# Patient Record
Sex: Female | Born: 1955 | Race: White | Hispanic: Yes | Marital: Married | Smoking: Former smoker
Health system: Southern US, Community
[De-identification: ages and names within clinical notes are randomized; demographics above are authoritative.]

## PROBLEM LIST (undated history)

## (undated) DIAGNOSIS — I5189 Other ill-defined heart diseases: Secondary | ICD-10-CM

## (undated) DIAGNOSIS — G8929 Other chronic pain: Secondary | ICD-10-CM

## (undated) DIAGNOSIS — I7300 Raynaud's syndrome without gangrene: Secondary | ICD-10-CM

## (undated) DIAGNOSIS — I779 Disorder of arteries and arterioles, unspecified: Secondary | ICD-10-CM

## (undated) DIAGNOSIS — M1990 Unspecified osteoarthritis, unspecified site: Secondary | ICD-10-CM

## (undated) DIAGNOSIS — M47812 Spondylosis without myelopathy or radiculopathy, cervical region: Secondary | ICD-10-CM

## (undated) DIAGNOSIS — Z9889 Other specified postprocedural states: Secondary | ICD-10-CM

## (undated) DIAGNOSIS — I2510 Atherosclerotic heart disease of native coronary artery without angina pectoris: Secondary | ICD-10-CM

## (undated) DIAGNOSIS — R51 Headache: Secondary | ICD-10-CM

## (undated) DIAGNOSIS — F329 Major depressive disorder, single episode, unspecified: Secondary | ICD-10-CM

## (undated) DIAGNOSIS — F419 Anxiety disorder, unspecified: Secondary | ICD-10-CM

## (undated) DIAGNOSIS — R002 Palpitations: Secondary | ICD-10-CM

## (undated) DIAGNOSIS — R112 Nausea with vomiting, unspecified: Secondary | ICD-10-CM

## (undated) DIAGNOSIS — F32A Depression, unspecified: Secondary | ICD-10-CM

## (undated) DIAGNOSIS — E063 Autoimmune thyroiditis: Secondary | ICD-10-CM

## (undated) DIAGNOSIS — R29898 Other symptoms and signs involving the musculoskeletal system: Secondary | ICD-10-CM

## (undated) DIAGNOSIS — T8859XA Other complications of anesthesia, initial encounter: Secondary | ICD-10-CM

## (undated) DIAGNOSIS — T4145XA Adverse effect of unspecified anesthetic, initial encounter: Secondary | ICD-10-CM

## (undated) HISTORY — PX: KNEE SURGERY: SHX244

## (undated) HISTORY — PX: HIP SURGERY: SHX245

## (undated) HISTORY — DX: Palpitations: R00.2

## (undated) HISTORY — DX: Other ill-defined heart diseases: I51.89

## (undated) HISTORY — PX: BACK SURGERY: SHX140

## (undated) HISTORY — PX: TONSILLECTOMY: SUR1361

## (undated) HISTORY — DX: Disorder of arteries and arterioles, unspecified: I77.9

## (undated) HISTORY — PX: SPINE SURGERY: SHX786

## (undated) HISTORY — DX: Spondylosis without myelopathy or radiculopathy, cervical region: M47.812

## (undated) HISTORY — DX: Other symptoms and signs involving the musculoskeletal system: R29.898

## (undated) HISTORY — DX: Raynaud's syndrome without gangrene: I73.00

## (undated) HISTORY — DX: Atherosclerotic heart disease of native coronary artery without angina pectoris: I25.10

---

## 1999-04-07 ENCOUNTER — Emergency Department (HOSPITAL_COMMUNITY): Admission: EM | Admit: 1999-04-07 | Discharge: 1999-04-07 | Payer: Self-pay | Admitting: Emergency Medicine

## 1999-04-22 ENCOUNTER — Ambulatory Visit (HOSPITAL_COMMUNITY): Admission: RE | Admit: 1999-04-22 | Discharge: 1999-04-22 | Payer: Self-pay | Admitting: Gastroenterology

## 1999-04-22 ENCOUNTER — Encounter: Payer: Self-pay | Admitting: Gastroenterology

## 1999-05-10 ENCOUNTER — Ambulatory Visit (HOSPITAL_COMMUNITY): Admission: RE | Admit: 1999-05-10 | Discharge: 1999-05-10 | Payer: Self-pay | Admitting: Gastroenterology

## 1999-05-10 ENCOUNTER — Encounter: Payer: Self-pay | Admitting: Gastroenterology

## 1999-09-11 ENCOUNTER — Encounter: Payer: Self-pay | Admitting: Emergency Medicine

## 1999-09-11 ENCOUNTER — Emergency Department (HOSPITAL_COMMUNITY): Admission: EM | Admit: 1999-09-11 | Discharge: 1999-09-11 | Payer: Self-pay | Admitting: Emergency Medicine

## 2002-04-03 ENCOUNTER — Encounter: Payer: Self-pay | Admitting: Emergency Medicine

## 2002-04-03 ENCOUNTER — Emergency Department (HOSPITAL_COMMUNITY): Admission: EM | Admit: 2002-04-03 | Discharge: 2002-04-03 | Payer: Self-pay | Admitting: Emergency Medicine

## 2002-04-03 ENCOUNTER — Encounter: Payer: Self-pay | Admitting: Family Medicine

## 2002-04-04 ENCOUNTER — Encounter: Payer: Self-pay | Admitting: Gastroenterology

## 2002-07-01 ENCOUNTER — Ambulatory Visit (HOSPITAL_COMMUNITY): Admission: RE | Admit: 2002-07-01 | Discharge: 2002-07-01 | Payer: Self-pay | Admitting: Family Medicine

## 2004-04-08 ENCOUNTER — Encounter: Admission: RE | Admit: 2004-04-08 | Discharge: 2004-04-08 | Payer: Self-pay | Admitting: Family Medicine

## 2004-04-19 ENCOUNTER — Encounter: Admission: RE | Admit: 2004-04-19 | Discharge: 2004-04-19 | Payer: Self-pay | Admitting: Family Medicine

## 2004-08-01 ENCOUNTER — Emergency Department (HOSPITAL_COMMUNITY): Admission: EM | Admit: 2004-08-01 | Discharge: 2004-08-02 | Payer: Self-pay | Admitting: Emergency Medicine

## 2004-08-02 ENCOUNTER — Ambulatory Visit: Payer: Self-pay | Admitting: Psychiatry

## 2004-08-02 ENCOUNTER — Inpatient Hospital Stay (HOSPITAL_COMMUNITY): Admission: EM | Admit: 2004-08-02 | Discharge: 2004-08-04 | Payer: Self-pay | Admitting: Psychiatry

## 2004-08-04 ENCOUNTER — Emergency Department (HOSPITAL_COMMUNITY): Admission: EM | Admit: 2004-08-04 | Discharge: 2004-08-04 | Payer: Self-pay | Admitting: Family Medicine

## 2005-06-30 ENCOUNTER — Encounter: Admission: RE | Admit: 2005-06-30 | Discharge: 2005-09-28 | Payer: Self-pay | Admitting: Neurosurgery

## 2005-07-21 ENCOUNTER — Ambulatory Visit (HOSPITAL_COMMUNITY): Admission: RE | Admit: 2005-07-21 | Discharge: 2005-07-21 | Payer: Self-pay | Admitting: Neurosurgery

## 2010-01-06 ENCOUNTER — Encounter: Admission: RE | Admit: 2010-01-06 | Discharge: 2010-01-06 | Payer: Self-pay | Admitting: Neurosurgery

## 2010-01-17 ENCOUNTER — Encounter: Admission: RE | Admit: 2010-01-17 | Discharge: 2010-01-17 | Payer: Self-pay | Admitting: Neurosurgery

## 2010-03-02 ENCOUNTER — Encounter: Admission: RE | Admit: 2010-03-02 | Discharge: 2010-03-02 | Payer: Self-pay | Admitting: Neurosurgery

## 2010-03-15 ENCOUNTER — Encounter: Admission: RE | Admit: 2010-03-15 | Discharge: 2010-03-15 | Payer: Self-pay | Admitting: Neurosurgery

## 2010-06-20 ENCOUNTER — Encounter
Admission: RE | Admit: 2010-06-20 | Discharge: 2010-06-20 | Payer: Self-pay | Source: Home / Self Care | Attending: Neurosurgery | Admitting: Neurosurgery

## 2010-06-21 ENCOUNTER — Emergency Department (HOSPITAL_COMMUNITY)
Admission: EM | Admit: 2010-06-21 | Discharge: 2010-06-21 | Payer: Self-pay | Source: Home / Self Care | Admitting: Emergency Medicine

## 2010-06-24 ENCOUNTER — Emergency Department (HOSPITAL_COMMUNITY)
Admission: EM | Admit: 2010-06-24 | Discharge: 2010-06-24 | Payer: Self-pay | Source: Home / Self Care | Admitting: Emergency Medicine

## 2010-06-27 LAB — URINALYSIS, ROUTINE W REFLEX MICROSCOPIC
Bilirubin Urine: NEGATIVE
Hgb urine dipstick: NEGATIVE
Ketones, ur: 15 mg/dL — AB
Nitrite: NEGATIVE
Protein, ur: NEGATIVE mg/dL
Specific Gravity, Urine: 1.01 (ref 1.005–1.030)
Urine Glucose, Fasting: NEGATIVE mg/dL
Urobilinogen, UA: 1 mg/dL (ref 0.0–1.0)
pH: 5.5 (ref 5.0–8.0)

## 2010-06-27 LAB — DIFFERENTIAL
Basophils Absolute: 0 10*3/uL (ref 0.0–0.1)
Basophils Relative: 0 % (ref 0–1)
Eosinophils Absolute: 0.1 10*3/uL (ref 0.0–0.7)
Eosinophils Relative: 1 % (ref 0–5)
Lymphocytes Relative: 11 % — ABNORMAL LOW (ref 12–46)
Lymphs Abs: 1 10*3/uL (ref 0.7–4.0)
Monocytes Absolute: 0.8 10*3/uL (ref 0.1–1.0)
Monocytes Relative: 10 % (ref 3–12)
Neutro Abs: 6.5 10*3/uL (ref 1.7–7.7)
Neutrophils Relative %: 78 % — ABNORMAL HIGH (ref 43–77)

## 2010-06-27 LAB — CBC
HCT: 50.2 % — ABNORMAL HIGH (ref 36.0–46.0)
Hemoglobin: 17.2 g/dL — ABNORMAL HIGH (ref 12.0–15.0)
MCH: 32.1 pg (ref 26.0–34.0)
MCHC: 34.3 g/dL (ref 30.0–36.0)
MCV: 93.7 fL (ref 78.0–100.0)
Platelets: 225 10*3/uL (ref 150–400)
RBC: 5.36 MIL/uL — ABNORMAL HIGH (ref 3.87–5.11)
RDW: 12.3 % (ref 11.5–15.5)
WBC: 8.4 10*3/uL (ref 4.0–10.5)

## 2010-06-27 LAB — COMPREHENSIVE METABOLIC PANEL
ALT: 11 U/L (ref 0–35)
AST: 18 U/L (ref 0–37)
Albumin: 4.3 g/dL (ref 3.5–5.2)
Alkaline Phosphatase: 57 U/L (ref 39–117)
BUN: 12 mg/dL (ref 6–23)
CO2: 27 mEq/L (ref 19–32)
Calcium: 9.4 mg/dL (ref 8.4–10.5)
Chloride: 101 mEq/L (ref 96–112)
Creatinine, Ser: 0.65 mg/dL (ref 0.4–1.2)
GFR calc Af Amer: >60 mL/min (ref >60)
GFR calc non Af Amer: >60 mL/min (ref >60)
Glucose, Bld: 122 mg/dL — ABNORMAL HIGH (ref 70–99)
Potassium: 3.2 mEq/L — ABNORMAL LOW (ref 3.5–5.1)
Sodium: 137 mEq/L (ref 135–145)
Total Bilirubin: 0.8 mg/dL (ref 0.3–1.2)
Total Protein: 6.4 g/dL (ref 6.0–8.3)

## 2010-06-27 LAB — LIPASE, BLOOD: Lipase: 18 U/L (ref 11–59)

## 2010-07-03 ENCOUNTER — Encounter: Payer: Self-pay | Admitting: Neurosurgery

## 2010-07-13 ENCOUNTER — Encounter: Payer: Self-pay | Admitting: Geriatric Medicine

## 2010-08-08 ENCOUNTER — Encounter (HOSPITAL_COMMUNITY): Payer: 59

## 2010-08-08 DIAGNOSIS — Z01811 Encounter for preprocedural respiratory examination: Secondary | ICD-10-CM | POA: Insufficient documentation

## 2010-08-08 DIAGNOSIS — Z01812 Encounter for preprocedural laboratory examination: Secondary | ICD-10-CM | POA: Insufficient documentation

## 2010-08-08 LAB — CBC
Hemoglobin: 15.2 g/dL — ABNORMAL HIGH (ref 12.0–15.0)
MCV: 96.9 fL (ref 78.0–100.0)
Platelets: 207 10*3/uL (ref 150–400)
RBC: 4.87 MIL/uL (ref 3.87–5.11)
WBC: 7.4 10*3/uL (ref 4.0–10.5)

## 2010-08-08 LAB — URINALYSIS, ROUTINE W REFLEX MICROSCOPIC
Hgb urine dipstick: NEGATIVE
Nitrite: NEGATIVE
Specific Gravity, Urine: 1.03 (ref 1.005–1.030)
Urobilinogen, UA: 0.2 mg/dL (ref 0.0–1.0)
pH: 5.5 (ref 5.0–8.0)

## 2010-08-08 LAB — COMPREHENSIVE METABOLIC PANEL
AST: 23 U/L (ref 0–37)
Albumin: 4.3 g/dL (ref 3.5–5.2)
Alkaline Phosphatase: 55 U/L (ref 39–117)
CO2: 30 mEq/L (ref 19–32)
Chloride: 104 mEq/L (ref 96–112)
GFR calc Af Amer: >60 mL/min (ref >60)
GFR calc non Af Amer: >60 mL/min (ref >60)
Potassium: 4.7 mEq/L (ref 3.5–5.1)
Total Bilirubin: 0.5 mg/dL (ref 0.3–1.2)

## 2010-08-12 ENCOUNTER — Ambulatory Visit (HOSPITAL_COMMUNITY): Payer: 59

## 2010-08-12 ENCOUNTER — Ambulatory Visit (HOSPITAL_COMMUNITY)
Admission: RE | Admit: 2010-08-12 | Discharge: 2010-08-12 | Disposition: A | Discharge status: Home / Self Care | Payer: 59 | Source: Ambulatory Visit | Attending: Orthopedic Surgery | Admitting: Orthopedic Surgery

## 2010-08-12 DIAGNOSIS — M24159 Other articular cartilage disorders, unspecified hip: Secondary | ICD-10-CM | POA: Insufficient documentation

## 2010-08-12 DIAGNOSIS — F17200 Nicotine dependence, unspecified, uncomplicated: Secondary | ICD-10-CM | POA: Insufficient documentation

## 2010-08-12 DIAGNOSIS — Z01818 Encounter for other preprocedural examination: Secondary | ICD-10-CM | POA: Insufficient documentation

## 2010-08-12 DIAGNOSIS — Z01812 Encounter for preprocedural laboratory examination: Secondary | ICD-10-CM | POA: Insufficient documentation

## 2010-08-18 NOTE — Op Note (Signed)
NAMEKALLIOPE, RIESEN              ACCOUNT NO.:  [id number]  MEDICAL RECORD NO.:  [id number]           PATIENT TYPE:  O  LOCATION:  DAYL                         FACILITY:  [facility]  PHYSICIAN:  Ollen Gross, M.D.    DATE OF BIRTH:  1955/10/19  DATE OF PROCEDURE:  08/12/2010 DATE OF DISCHARGE:                              OPERATIVE REPORT   PREOPERATIVE DIAGNOSIS:  Right hip labral tear.  POSTOPERATIVE DIAGNOSIS:  Right hip labral tear, plus acetabular chondral defect.  PROCEDURE:  Right hip arthroscopy and labral debridement and chondroplasty.  SURGEON:  Ollen Gross, M.D.  ASSISTANT:  Alexzandrew L. Perkins, P.A.C.  ANESTHESIA:  General.  ESTIMATED BLOOD LOSS:  Minimal.  DRAINS:  None.  COMPLICATIONS:  None.  CONDITION.:  Stable to recovery.  BRIEF CLINICAL NOTE:  Eri is a 55 year old female who had a long history of several months of significant right hip pain and limited mobility.  Her x-rays are normal.  Exam and history suggests a labral tear confirmed by MRI.  She has failed nonoperative management and presents for hip arthroscopy and debridement.  PROCEDURE IN DETAIL:  After successful administration of general anesthetic, the patient was placed in the left lateral decubitus position with the right side up.  The right thigh was placed over the well-padded perineal post and right foot placed in well-padded leg holder.  Under fluoroscopic guidance, traction was applied to the joint until the hip was adequately distracted.  Under fluoro guidance, once confirmed to be adequately distracted, it was locked in this position. Thigh was then prepped and draped in usual sterile fashion.  Spinal needles were passed into the joint and the sites marked with anterior and posterior peritrochanteric portals.  Both were felt to enter the joint and confirmed to be intra-articular by injecting saline through the posterior needle and have it exit through the anterior needle.   The portals were then created over nitinol wires.  The camera was passed through the cannula into the joint posteriorly.  Arthroscopic visualization proceeds.  Femoral head looked fairly normal throughout its entire extent.  I did not see any chondral defects or significant chondromalacia.  Posterior aspect of the joint looked normal, superior looked normal.  Anteriorly, there was a massive labral tear from the 4 o'clock all the way to the 1 o'clock position.  We then created the anterior portal.  The labrum was not detached from the acetabular wall.  It was still attached.  The tear was in the substance and the edge of the labrum.  This was debrided back to a stable base with the 4.2-mm shaver and with the ArthroCare device. The ArthroCare helped seal with tissue loss.  There was also an acetabular chondral defect about 1 cm x 2 cm at about 3 o'clock to 4 o'clock position.  There was exposed bone in this area.  The bone was abraded.  The edges of this were debrided down to stable cartilaginous edges and probed and there was no other loose cartilage on the acetabulum.  Joints again inspected.  No other tears, defects or loose bodies noted.  A 20 cc of 0.25% Marcaine  with epi were then injected into the joint through the inflow cannula.  Then equipment was removed from the joint and traction released.  The hip was concentrically reduced.  The incisions were then closed with interrupted 4-0 nylon. Bulky sterile dressings were then applied and she was awakened and transported to recovery in stable condition.     Ollen Gross, M.D.     FA/MEDQ  D:  08/12/2010  T:  08/12/2010  Job:  [id number]  Electronically Signed by Ollen Gross M.D. on 08/17/2010 03:46:57 PM
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
## 2010-10-28 NOTE — Op Note (Signed)
NAME:  Michaela Hall, Michaela Hall                        ACCOUNT NO.:  [id number]   MEDICAL RECORD NO.:  [id number]                   PATIENT TYPE:  EMS   LOCATION:  MAJO                                 FACILITY:  [facility]   PHYSICIAN:  Bernette Redbird, MD                  DATE OF BIRTH:  July 22, 1955   DATE OF PROCEDURE:  04/03/2002  DATE OF DISCHARGE:                                 OPERATIVE REPORT   PROCEDURE PERFORMED:  Upper endoscopy.   ENDOSCOPIST:  Florencia Reasons, M.D.   INDICATIONS FOR PROCEDURE:  The patient is a 55 year old female who  presented to the emergency room tonight because of the abrupt onset of  severe 10/10 pain in her chest and upper abdomen which occurred while she  was out in the back yard, without any recent food intake.  This was  associated with frank syncope, subsequently by hematemesis, then further  presyncope and continued hematemesis.   On arrival at the emergency room, findings were fairly unimpressive.  Vital  signs were okay, the patient looked fairly good.  Hemoglobin was 15, BUN was  14, stool was hemoccult negative.  Abdominal exam was benign, but on the  last endoscopic evaluation was felt to be appropriate.   FINDINGS:  The purpose of this study is to rule out ulcer disease, Mallory-  Weiss tear, or varices as the source of the upper GI bleeding.  Essentially  normal exam.  Minor insignificant abnormalities as described below.   DESCRIPTION OF PROCEDURE:  The nature, purpose and risks of the procedure  had been reviewed with the patient who provided written consent.  The  procedure was done at the beside in the emergency room.  Sedation was  fentanyl 50 mcg and Versed 7 mg IV without arrhythmias or desaturation.  The  Olympus video endoscope was passed under direct vision.  Vocal cords were  unremarkable.  The esophagus was readily entered.   Just above the squamocolumnar junction, the esophageal was slightly  chronically inflamed and irregular,  but this is really a very minimal  finding.  No erosive changes, Barrett's esophagus, varices, infection or  neoplasia were observed.  There was a widely patent esophageal mucosal ring  and below this was a 2 cm  hiatal hernia.  I saw no evidence of a Mallory-  Weiss tear there or any stigma or hemorrhage.   The stomach was entered.  It contained absolutely  no blood or coffee ground  material apart from some adherent tiny strands of mucosal hemorrhage blood  scattered here and there in the stomach.  No gastritis, erosions, ulcers,  polyps, masses or vascular ectasia were observed including a retroflex view  of the proximal stomach.  The pylorus, duodenal bulb and entire duodenum  looked normal (the scope was able to be inserted and advanced quite far down  the small bowel).   Pullback was then performed and I  re-examined the stomach and duodenum  again, yet still I could not find any specific source of  hematemesis.  The  scope was then removed from the patient, who tolerated the procedure well  and there were no apparent complications.  No biopsies were obtained.   IMPRESSION:  Essentially normal upper endoscopy (note that a small amount of  bilious fluid was present in the stomach at the time of the procedure).   PLAN:  I would favor a CT scan to help rule out anatomic abnormalities such  as the pancreatic artery aneurysm that could lead to hematemesis.  If that  is negative, then the patient could be worked up in standard fashion for her  syncope to the degree it is felt to be appropriate by ER staff physician.                                                Bernette Redbird, MD    RB/MEDQ  D:  04/04/2002  T:  04/04/2002  Job:  [id number]   cc:   [name], M.D.

## 2010-10-28 NOTE — Consult Note (Signed)
NAME:  Michaela Hall, Michaela Hall                        ACCOUNT NO.:  [id number]   MEDICAL RECORD NO.:  [id number]                   PATIENT TYPE:  EMS   LOCATION:  MAJO                                 FACILITY:  [facility]   PHYSICIAN:  Bernette Redbird, MD                  DATE OF BIRTH:  12/20/1955   DATE OF CONSULTATION:  04/04/2002  DATE OF DISCHARGE:                                   CONSULTATION   Dr. Nelva Nay of the [facility] ER staff asked me to see this 55-year-  old female because of hematemesis and syncope.   HISTORY OF PRESENT ILLNESS:  The patient is known to me from many years ago  when I saw her in the office and did endoscopy on her as part of an calculus  gallbladder disease workup.   Generally, she is free of any GI tract symptoms, and she has no ulcer risk  factors apart from smoking one-half pack per day, specifically, no  significant recent use of aspirin or NSAIDs.   With that background, she was standing in the backyard this evening when she  had the onset of severe chest pain like a heart attack.  She felt like she  was about to pass out and in fact did fall to the ground after trying to sit  down on the trash can.  She walked into the house after coming to and was  going to take a shower, but again got presyncopal and had to let herself  down.  While inside, she had hematemesis initially of dark blood and then of  brighter red blood and threw up blood all the way to the hospital.   Interestingly, in the emergency room, the patient has been hemodynamically  quite stable and has been feeling much better since receiving Zofran.   ALLERGIES:  CODEINE (makes her feel sick).   CURRENT MEDICATIONS:  Prozac 20 mg daily.   PAST SURGICAL HISTORY:  1. Laminectomy x 2.  2. Knee surgery.   PAST MEDICAL HISTORY:  Depression.  No history of ulcer disease or GI  bleeding.  No know cardiopulmonary disease, diabetes, or hypertension.   HABITS:  Smokes one-half pack per  day, occasional ethanol.   FAMILY HISTORY:  Negative for colon cancer or gallbladder trouble.   SOCIAL HISTORY:  Lives with her husband, Raiford Noble, who is also a patient of  mine.   REVIEW OF SYSTEMS:  Negative for prodromal upper tract or lower tract GI  symptoms.   PHYSICAL EXAMINATION:  GENERAL: The patient at this time is in no evident  distress whatsoever.  VITAL SIGNS:  Blood pressure 94/63, pulse 81.  HEENT:  She is anicteric and without frank pallor.  CHEST:  Clear.  HEART:  Normal.  ABDOMEN:  No bruits, no organomegaly, no guarding, mass, tenderness, or  pulsatile mass.  No impressive tenderness whatsoever; perhaps just a little  bit of  touchiness in the upper abdomen.  RECTAL:  Completely empty rectal ampulla with mucoid heme-negative stool  grossly by my testing.   LABORATORY DATA:  Hemoglobin normal at 15.  BUN 14.  White count normal.   Three-way abdomen normal.   EKG normal.   Cardiac enzymes normal.   IMPRESSION:  Abrupt onset of upper abdominal symptoms of unclear cause,  associated with hematemesis but not so far significant drop in hemoglobin.   PLAN:  Endoscopic evaluation with further management to depend on the  findings of that exam.                                                Bernette Redbird, MD    RB/MEDQ  D:  04/04/2002  T:  04/04/2002  Job:  [id number]

## 2011-03-10 ENCOUNTER — Emergency Department (HOSPITAL_COMMUNITY): Payer: 59

## 2011-03-10 ENCOUNTER — Emergency Department (HOSPITAL_COMMUNITY)
Admission: EM | Admit: 2011-03-10 | Discharge: 2011-03-10 | Disposition: A | Discharge status: Home / Self Care | Payer: 59 | Attending: Emergency Medicine | Admitting: Emergency Medicine

## 2011-03-10 DIAGNOSIS — M545 Low back pain, unspecified: Secondary | ICD-10-CM | POA: Insufficient documentation

## 2011-03-10 DIAGNOSIS — M79609 Pain in unspecified limb: Secondary | ICD-10-CM | POA: Insufficient documentation

## 2011-03-10 DIAGNOSIS — M5126 Other intervertebral disc displacement, lumbar region: Secondary | ICD-10-CM | POA: Insufficient documentation

## 2011-03-10 DIAGNOSIS — M48061 Spinal stenosis, lumbar region without neurogenic claudication: Secondary | ICD-10-CM | POA: Insufficient documentation

## 2011-03-10 DIAGNOSIS — M51379 Other intervertebral disc degeneration, lumbosacral region without mention of lumbar back pain or lower extremity pain: Secondary | ICD-10-CM | POA: Insufficient documentation

## 2011-03-10 DIAGNOSIS — M5137 Other intervertebral disc degeneration, lumbosacral region: Secondary | ICD-10-CM | POA: Insufficient documentation

## 2011-03-10 LAB — URINALYSIS, ROUTINE W REFLEX MICROSCOPIC
Glucose, UA: NEGATIVE mg/dL
Leukocytes, UA: NEGATIVE
Nitrite: NEGATIVE
Specific Gravity, Urine: 1.009 (ref 1.005–1.030)
pH: 7.5 (ref 5.0–8.0)

## 2011-03-10 LAB — POCT PREGNANCY, URINE: Preg Test, Ur: NEGATIVE

## 2011-03-15 ENCOUNTER — Inpatient Hospital Stay (HOSPITAL_COMMUNITY)
Admission: EM | Admit: 2011-03-15 | Discharge: 2011-03-18 | DRG: 552 | Disposition: A | Discharge status: Home / Self Care | Payer: 59 | Attending: Family Medicine | Admitting: Family Medicine

## 2011-03-15 DIAGNOSIS — M47817 Spondylosis without myelopathy or radiculopathy, lumbosacral region: Principal | ICD-10-CM | POA: Diagnosis present

## 2011-03-15 DIAGNOSIS — F17200 Nicotine dependence, unspecified, uncomplicated: Secondary | ICD-10-CM | POA: Diagnosis present

## 2011-03-15 DIAGNOSIS — M25559 Pain in unspecified hip: Secondary | ICD-10-CM | POA: Diagnosis present

## 2011-03-15 DIAGNOSIS — IMO0002 Reserved for concepts with insufficient information to code with codable children: Secondary | ICD-10-CM

## 2011-03-15 DIAGNOSIS — F3130 Bipolar disorder, current episode depressed, mild or moderate severity, unspecified: Secondary | ICD-10-CM | POA: Diagnosis present

## 2011-03-15 DIAGNOSIS — F1010 Alcohol abuse, uncomplicated: Secondary | ICD-10-CM | POA: Diagnosis present

## 2011-03-15 DIAGNOSIS — M5137 Other intervertebral disc degeneration, lumbosacral region: Secondary | ICD-10-CM | POA: Diagnosis present

## 2011-03-15 DIAGNOSIS — M51379 Other intervertebral disc degeneration, lumbosacral region without mention of lumbar back pain or lower extremity pain: Secondary | ICD-10-CM | POA: Diagnosis present

## 2011-03-15 DIAGNOSIS — G8929 Other chronic pain: Secondary | ICD-10-CM | POA: Diagnosis present

## 2011-03-15 DIAGNOSIS — F909 Attention-deficit hyperactivity disorder, unspecified type: Secondary | ICD-10-CM | POA: Diagnosis present

## 2011-03-15 DIAGNOSIS — R52 Pain, unspecified: Secondary | ICD-10-CM | POA: Diagnosis present

## 2011-03-15 LAB — CBC
HCT: 46 % (ref 36.0–46.0)
Hemoglobin: 16.1 g/dL — ABNORMAL HIGH (ref 12.0–15.0)
RBC: 4.95 MIL/uL (ref 3.87–5.11)
RDW: 12.6 % (ref 11.5–15.5)
WBC: 8.3 10*3/uL (ref 4.0–10.5)

## 2011-03-15 LAB — DIFFERENTIAL
Basophils Absolute: 0 10*3/uL (ref 0.0–0.1)
Eosinophils Relative: 1 % (ref 0–5)
Lymphocytes Relative: 22 % (ref 12–46)
Neutro Abs: 5.8 10*3/uL (ref 1.7–7.7)
Neutrophils Relative %: 69 % (ref 43–77)

## 2011-03-15 LAB — BASIC METABOLIC PANEL
BUN: 16 mg/dL (ref 6–23)
CO2: 28 mEq/L (ref 19–32)
Chloride: 99 mEq/L (ref 96–112)
GFR calc Af Amer: >90 mL/min (ref >90)
Potassium: 4.4 mEq/L (ref 3.5–5.1)

## 2011-03-16 LAB — COMPREHENSIVE METABOLIC PANEL
Alkaline Phosphatase: 66 U/L (ref 39–117)
BUN: 24 mg/dL — ABNORMAL HIGH (ref 6–23)
Chloride: 104 mEq/L (ref 96–112)
GFR calc Af Amer: >90 mL/min (ref >90)
Glucose, Bld: 163 mg/dL — ABNORMAL HIGH (ref 70–99)
Potassium: 4.3 mEq/L (ref 3.5–5.1)
Total Bilirubin: 0.4 mg/dL (ref 0.3–1.2)

## 2011-03-16 LAB — CBC
HCT: 42.3 % (ref 36.0–46.0)
Hemoglobin: 14.2 g/dL (ref 12.0–15.0)
MCHC: 33.6 g/dL (ref 30.0–36.0)

## 2011-03-17 ENCOUNTER — Inpatient Hospital Stay (HOSPITAL_COMMUNITY): Payer: 59

## 2011-03-17 MED ORDER — IOHEXOL 180 MG/ML  SOLN
10.0000 mL | Freq: Once | INTRAMUSCULAR | Status: AC | PRN
  Administered 2011-03-17: 2 mL via EPIDURAL

## 2011-03-17 NOTE — Consult Note (Signed)
  NAMEDIANE, HANEL NO.:  [id number]  MEDICAL RECORD NO.:  [id number]  LOCATION:  4503                         FACILITY:  [facility]  PHYSICIAN:  Hilda Lias, M.D.   DATE OF BIRTH:  01-Dec-1955  DATE OF CONSULTATION:  03/16/2011 DATE OF DISCHARGE:                                CONSULTATION   Ms. Bias is a 55 year old female well known to me who has been complaining of back pain.  This problem has been going on for many years.  She has had significant disk disease with acute exacerbation. The pain was intense.  She got the medicine injected when  she was admitted.  Right now, the pain is mostly localized in the lumbar area, through the thigh, and then into the shin both bilateral.  She has taken pain medications.  She has lumbar corset and she is to have epidural injection.  Clinically, straight-leg raising is positive for 60 degrees. She has some mild weakness of the dorsiflexion of both feet and mild weakness of the __________.  Reflexes are normal.  She has almost completely decreased flexibility secondary to lumbar spine secondary to pain.  The MRI shows severe degenerative disk disease of the L3, L4, and L5.  I talked to her at length.  We are going to see how she does with the brace and the epidural injection.  I am unsure if this lady is going to require decompression and fusion from L3 to L5.  My feeling is that once she has the injection, she can be discharged and will be followed by me in my office.          ______________________________ Hilda Lias, M.D.     EB/MEDQ  D:  03/16/2011  T:  03/17/2011  Job:  [id number]  Electronically Signed by Hilda Lias M.D. on 03/17/2011 06:18:15 PM

## 2011-03-18 LAB — BASIC METABOLIC PANEL
Chloride: 104 mEq/L (ref 96–112)
GFR calc Af Amer: >90 mL/min (ref >90)
Potassium: 3.8 mEq/L (ref 3.5–5.1)

## 2011-03-18 LAB — CBC
Platelets: 201 10*3/uL (ref 150–400)
RDW: 12.6 % (ref 11.5–15.5)
WBC: 7.8 10*3/uL (ref 4.0–10.5)

## 2011-03-22 NOTE — Discharge Summary (Signed)
  NAMEBRYNDLE, CORREDOR NO.:  [id number]  MEDICAL RECORD NO.:  [id number]  LOCATION:  4503                         FACILITY:  [facility]  PHYSICIAN:  Tarry Kos, MD       DATE OF BIRTH:  1956/04/23  DATE OF ADMISSION:  03/15/2011 DATE OF DISCHARGE:  03/18/2011                              DISCHARGE SUMMARY   DISCHARGE DIAGNOSES: 1. Sciatica. 2. Acute on chronic back pain status post lumbar steroid injection. 3. Bipolar disorder. 4. Tobacco abuse.  SUMMARY OF HOSPITAL COURSE:  Ms. Selway is a 55 year old female who has chronic back pain, degenerative joint disease who presented to the emergency department with sciatica.  She was admitted, placed on IV Dilaudid which has been tapered down.  Neurosurgery was consulted as she follows Dr. Jeral Fruit as she had a lumbar steroid injection which has helped her pain.  Her pain is much better and is back to her baseline. She is to follow up with the primary care physician in 1 week and with Dr. Jeral Fruit for possible future decompression and fusion of L3-L5.  The patient did request stronger narcotics at discharge.  I instructed her that she would have to address this with her physician that provides her chronic pain medications.  I did give her a temporary prescription for Percocet as she is supposed to call Monday morning to get refills on her narcotics.  I did give her several days supply to make sure she does not run out.  PHYSICAL EXAMINATION:  VITAL SIGNS:  She has been afebrile.  Vital signs have been stable. GENERAL:  She is alert and oriented x4.  No apparent stress, cooperative and friendly. HEART:  Regular rate and rhythm without murmurs or gallops. CHEST:  Clear to auscultation bilaterally.  No wheezes or rhonchi. ABDOMEN:  Soft, nontender, nondistended.  Positive bowel sounds.  No hepatosplenomegaly. EXTREMITIES:  No clubbing, cyanosis or edema. PSYCH:  Normal affect. NEURO:  No focal neurologic deficit.   Cranial II through XII grossly intact. Date has been normal. SKIN:  No rashes.  The patient is being discharged to home with appropriate outpatient followup.          ______________________________ Tarry Kos, MD     RD/MEDQ  D:  03/18/2011  T:  03/18/2011  Job:  [id number]  Electronically Signed by Tarry Kos MD on 03/22/2011 07:02:03 PM

## 2011-03-23 ENCOUNTER — Other Ambulatory Visit: Payer: Self-pay | Admitting: Neurosurgery

## 2011-03-23 DIAGNOSIS — M549 Dorsalgia, unspecified: Secondary | ICD-10-CM

## 2011-03-25 NOTE — H&P (Signed)
NAMEYAHAIRA, BRUSKI              ACCOUNT NO.:  [id number]  MEDICAL RECORD NO.:  [id number]  LOCATION:  4503                         FACILITY:  [facility]  PHYSICIAN:  Lonia Blood, M.D.      DATE OF BIRTH:  03/24/1956  DATE OF ADMISSION:  03/15/2011 DATE OF DISCHARGE:                             HISTORY & PHYSICAL   PRIMARY CARE PHYSICIAN:  She is unassigned to [place].  NEUROSURGEON:  Hilda Lias, MD  PRESENTING COMPLAINT:  Low back pain.  HISTORY OF PRESENT ILLNESS:  The patient is a 55 year old female with known history of degenerative disk disease, who has had multiple back surgeries.  She has been having sciatica-like symptoms and has followed up with Dr. Jeral Fruit in his office last week.  She had an MRI on March 10, 2011, which showed right paracentral disk protrusion at L1-2, but that has improved.  She also had moderate spinal stenosis L3-4 that was a change from prior study.  She has known disk degeneration and spondylosis at L4-5 which was also unchanged.  The patient continued to have severe pain, came to the ED, was treated repeatedly with medications with not much response.  She is subsequently being admitted for pain control.  Dr. Newell Coral has been controlled, covering fro Dr. Jeral Fruit and he recommended medicine admission with neurosurgical consultation.  The patient is describing her pain as 10/10.  The least it came down was 5/10 with medication.  Pain radiates down her right leg at the back.  Her past medical history is significant for: 1. Chronic back pain.  She has apparently had 2 back surgeries. 2. History of right hip labral tear, status post arthroscopy and     labral debridement as well as chondroplasty was done by Dr. Lequita Halt     in March 2012. 3. Chronic tobacco use. 4. Some neuropathy.  ALLERGIES:  No known drug allergies.  CURRENT MEDICATIONS:  Paxil, Zofran, and Neurontin.  SOCIAL HISTORY:  She lives in [place], [place].  She smokes  1 pack per day.  Occasional alcohol.  No IV drug use.  FAMILY HISTORY:  Denied any significant family history.  REVIEW OF SYSTEMS:  The patient has bipolar disorder and has had prior hospitalization in the psychiatric unit back in 2006.  Also history of attention deficit hyperactive hyperactivity disorder and alcohol abuse. All these seemed to be stable according to the patient at this point. Otherwise, all systems reviewed are negative except per HPI.  PHYSICAL EXAMINATION:  VITAL SIGNS:  Temperature 98.3, blood pressure 118/62 with pulse 80, respiratory rate 21, sats 100% on room air. GENERAL:  She is awake, alert, oriented, in obvious distress due to pain.  The patient is in a fetal position. HEENT:  PERL.  EOMI.  No pallor.  No jaundice.  No rhinorrhea. NECK:  Supple.  No JVD.  No lymphadenopathy. RESPIRATORY:  She has good air entry bilaterally.  No wheezes, no rales, no crackles. CARDIOVASCULAR:  Patient has S1 and S2.  No murmur. ABDOMEN:  Soft, full, nontender with positive bowel sounds. EXTREMITIES:  No edema, cyanosis, or clubbing. SKIN:  No rashes.  No ulcers. NEURO:  Cranial nerves II through XII seemed to be  intact.  The patient has positive straight leg raising, right more than left but good muscle tone.  No joint swelling or tenderness.  LABORATORY DATA:  Sodium 138, potassium 4.4, chloride 99, CO2 is 28, glucose 109, BUN 16, creatinine 0.76 with calcium 10.2.  White count is 8.3; hemoglobin 16.1 with platelet count of 249 and normal differentials.  Urinalysis is essentially negative.  ASSESSMENT:  This is a 55 year old female presenting with sciatica that has not had any adequate pain control in the ED.  PLAN: 1. Sciatica.  Admit the patient for observation and for pain control.     Because of her past history of back surgery and no history of     degenerative disk disease, we will get the neurosurgeons again to     come back and make further suggestions.  More  than likely what the     patient will need is adjustment of her home pain medication and     refer her to Pain Clinic. 2. Tobacco abuse.  She will receive tobacco cessation counseling and     put on nicotine patch. 3. Bipolar disorder, depression.  Again, the patient is not taking any     medicine at this point, she may need to get back on that.     Lonia Blood, M.D.     Verlin Grills  D:  03/16/2011  T:  03/16/2011  Job:  [id number]  Electronically Signed by Lonia Blood M.D. on 03/25/2011 03:23:16 PM

## 2011-04-03 ENCOUNTER — Ambulatory Visit
Admission: RE | Admit: 2011-04-03 | Discharge: 2011-04-03 | Disposition: A | Discharge status: Home / Self Care | Payer: 59 | Source: Ambulatory Visit | Attending: Neurosurgery | Admitting: Neurosurgery

## 2011-04-03 ENCOUNTER — Other Ambulatory Visit: Payer: Self-pay | Admitting: Neurosurgery

## 2011-04-03 DIAGNOSIS — M549 Dorsalgia, unspecified: Secondary | ICD-10-CM

## 2011-04-03 MED ORDER — IOHEXOL 180 MG/ML  SOLN
1.0000 mL | Freq: Once | INTRAMUSCULAR | Status: AC | PRN
  Administered 2011-04-03: 1 mL via EPIDURAL

## 2011-04-03 MED ORDER — ONDANSETRON HCL 4 MG/2ML IJ SOLN
4.0000 mg | Freq: Once | INTRAMUSCULAR | Status: AC
  Administered 2011-04-03: 4 mg via INTRAMUSCULAR

## 2011-04-03 MED ORDER — METHYLPREDNISOLONE ACETATE 40 MG/ML INJ SUSP (RADIOLOG
120.0000 mg | Freq: Once | INTRAMUSCULAR | Status: AC
  Administered 2011-04-03: 120 mg via EPIDURAL

## 2011-04-03 MED ORDER — MEPERIDINE HCL 100 MG/ML IJ SOLN
50.0000 mg | Freq: Once | INTRAMUSCULAR | Status: AC
  Administered 2011-04-03: 50 mg via INTRAMUSCULAR

## 2011-09-08 ENCOUNTER — Encounter (HOSPITAL_BASED_OUTPATIENT_CLINIC_OR_DEPARTMENT_OTHER): Payer: Self-pay | Admitting: *Deleted

## 2011-09-08 ENCOUNTER — Other Ambulatory Visit: Payer: Self-pay

## 2011-09-08 ENCOUNTER — Emergency Department (HOSPITAL_BASED_OUTPATIENT_CLINIC_OR_DEPARTMENT_OTHER)
Admission: EM | Admit: 2011-09-08 | Discharge: 2011-09-08 | Disposition: A | Discharge status: Home / Self Care | Payer: 59 | Attending: Emergency Medicine | Admitting: Emergency Medicine

## 2011-09-08 ENCOUNTER — Emergency Department (INDEPENDENT_AMBULATORY_CARE_PROVIDER_SITE_OTHER): Payer: 59

## 2011-09-08 DIAGNOSIS — F17200 Nicotine dependence, unspecified, uncomplicated: Secondary | ICD-10-CM | POA: Insufficient documentation

## 2011-09-08 DIAGNOSIS — IMO0001 Reserved for inherently not codable concepts without codable children: Secondary | ICD-10-CM | POA: Insufficient documentation

## 2011-09-08 DIAGNOSIS — G8929 Other chronic pain: Secondary | ICD-10-CM | POA: Insufficient documentation

## 2011-09-08 DIAGNOSIS — R079 Chest pain, unspecified: Secondary | ICD-10-CM | POA: Insufficient documentation

## 2011-09-08 DIAGNOSIS — R6883 Chills (without fever): Secondary | ICD-10-CM | POA: Insufficient documentation

## 2011-09-08 DIAGNOSIS — R5383 Other fatigue: Secondary | ICD-10-CM

## 2011-09-08 DIAGNOSIS — R5381 Other malaise: Secondary | ICD-10-CM | POA: Insufficient documentation

## 2011-09-08 DIAGNOSIS — R1110 Vomiting, unspecified: Secondary | ICD-10-CM | POA: Insufficient documentation

## 2011-09-08 DIAGNOSIS — Z79899 Other long term (current) drug therapy: Secondary | ICD-10-CM | POA: Insufficient documentation

## 2011-09-08 HISTORY — DX: Other chronic pain: G89.29

## 2011-09-08 LAB — CBC
HCT: 48.3 % — ABNORMAL HIGH (ref 36.0–46.0)
Hemoglobin: 16.5 g/dL — ABNORMAL HIGH (ref 12.0–15.0)
RBC: 5.35 MIL/uL — ABNORMAL HIGH (ref 3.87–5.11)
RDW: 12.8 % (ref 11.5–15.5)
WBC: 7.1 10*3/uL (ref 4.0–10.5)

## 2011-09-08 LAB — BASIC METABOLIC PANEL
CO2: 26 mEq/L (ref 19–32)
Chloride: 101 mEq/L (ref 96–112)
GFR calc Af Amer: >90 mL/min (ref >90)
Potassium: 4.1 mEq/L (ref 3.5–5.1)
Sodium: 139 mEq/L (ref 135–145)

## 2011-09-08 LAB — URINALYSIS, ROUTINE W REFLEX MICROSCOPIC
Glucose, UA: NEGATIVE mg/dL
Leukocytes, UA: NEGATIVE
Nitrite: NEGATIVE
Specific Gravity, Urine: 1.022 (ref 1.005–1.030)
pH: 6 (ref 5.0–8.0)

## 2011-09-08 MED ORDER — PROMETHAZINE HCL 25 MG/ML IJ SOLN
12.5000 mg | Freq: Once | INTRAMUSCULAR | Status: DC
  Filled 2011-09-08: qty 1

## 2011-09-08 MED ORDER — SODIUM CHLORIDE 0.9 % IV BOLUS (SEPSIS)
1000.0000 mL | Freq: Once | INTRAVENOUS | Status: AC
  Administered 2011-09-08: 1000 mL via INTRAVENOUS

## 2011-09-08 MED ORDER — KETOROLAC TROMETHAMINE 30 MG/ML IJ SOLN
30.0000 mg | Freq: Once | INTRAMUSCULAR | Status: AC
  Administered 2011-09-08: 30 mg via INTRAVENOUS
  Filled 2011-09-08: qty 1

## 2011-09-08 MED ORDER — ONDANSETRON HCL 4 MG/2ML IJ SOLN
4.0000 mg | Freq: Once | INTRAMUSCULAR | Status: AC
  Administered 2011-09-08: 4 mg via INTRAVENOUS
  Filled 2011-09-08: qty 2

## 2011-09-08 NOTE — ED Provider Notes (Signed)
Medical screening examination/treatment/procedure(s) were performed by non-physician practitioner and as supervising physician I was immediately available for consultation/collaboration.  Ethelda Chick, MD 09/08/11 1530

## 2011-09-08 NOTE — ED Notes (Signed)
Weakness. Vomiting. Weight loss 15 # over a month. Has not been able to take her 8 Percocet per day due to vomiting. Chronic back pain.

## 2011-09-08 NOTE — ED Notes (Signed)
Pt. Is in no distress.  Pt. Walked to restroom with no distress.  Pt has generalized weakness and is resting after the walk to restroom.

## 2011-09-08 NOTE — Discharge Instructions (Signed)
Nausea and Vomiting   Nausea means you feel sick to your stomach. Throwing up (vomiting) is a reflex where stomach contents come out of your mouth.   HOME CARE   Take medicine as told by your doctor.   Do not force yourself to eat. However, you do need to drink fluids.   If you feel like eating, eat a normal diet as told by your doctor.   Eat rice, wheat, potatoes, bread, lean meats, yogurt, fruits, and vegetables.   Avoid high-fat foods.   Drink enough fluids to keep your pee (urine) clear or pale yellow.   Ask your doctor how to replace body fluid losses (rehydrate). Signs of body fluid loss (dehydration) include:   Feeling very thirsty.   Dry lips and mouth.   Feeling dizzy.   Dark pee.   Peeing less than normal.   Feeling confused.   Fast breathing or heart rate.   GET HELP RIGHT AWAY IF:   You have blood in your throw up.   You have black or bloody poop (stool).   You have a bad headache or stiff neck.   You feel confused.   You have bad belly (abdominal) pain.   You have chest pain or trouble breathing.   You do not pee at least once every 8 hours.   You have cold, clammy skin.   You keep throwing up after 24 to 48 hours.   You have a fever.   MAKE SURE YOU:   Understand these instructions.   Will watch your condition.   Will get help right away if you are not doing well or get worse.   Document Released: 11/15/2007 Document Revised: 05/18/2011 Document Reviewed: 10/28/2010   ExitCare Patient Information 2012 [employer].

## 2011-09-08 NOTE — ED Notes (Signed)
pts nausea is better. She took one of her own Percocet

## 2011-09-08 NOTE — ED Provider Notes (Signed)
History     CSN: [id number]  Arrival date & time 09/08/11  1209   First MD Initiated Contact with Patient 09/08/11 1219      Chief Complaint  Patient presents with  . Weakness    (Consider location/radiation/quality/duration/timing/severity/associated sxs/prior treatment) HPI Comments: Pt states that she had some chest pressure yesterday that  Lasted a couple of minutes:pt has not had any pain today:pt states that she is having generalized weakness:pt states that she has not been able to take her percocet because she is having persistent symptoms  Patient is a 56 y.o. female presenting with vomiting. The history is provided by the patient. No language interpreter was used.  Emesis  This is a new problem. The current episode started 2 days ago. The problem occurs more than 10 times per day. The problem has not changed since onset.The emesis has an appearance of stomach contents. There has been no fever. Associated symptoms include chills and myalgias. Pertinent negatives include no cough, no diarrhea and no fever.    Past Medical History  Diagnosis Date  . Chronic pain     Past Surgical History  Procedure Date  . Back surgery   . Hip surgery   . Tonsillectomy     No family history on file.  History  Substance Use Topics  . Smoking status: Current Everyday Smoker -- 1.0 packs/day  . Smokeless tobacco: Not on file  . Alcohol Use: No    OB History    Grav Para Term Preterm Abortions TAB SAB Ect Mult Living                  Review of Systems  Constitutional: Positive for chills. Negative for fever.  HENT: Negative.   Eyes: Negative.   Respiratory: Negative for cough.   Cardiovascular: Negative.   Gastrointestinal: Positive for vomiting. Negative for diarrhea.  Musculoskeletal: Positive for myalgias.  Neurological: Negative.   Psychiatric/Behavioral: Negative.     Allergies  Review of patient's allergies indicates no known allergies.  Home Medications    Current Outpatient Rx  Name Route Sig Dispense Refill  . GABAPENTIN PO Oral Take by mouth.    Marland Kitchen ZOFRAN PO Oral Take by mouth.    Marland Kitchen PERCOCET PO Oral Take by mouth.      BP 111/68  Pulse 86  Temp(Src) 98.2 F (36.8 C) (Oral)  Resp 22  Wt 100 lb (45.36 kg)  SpO2 96%  Physical Exam  Nursing note and vitals reviewed. Constitutional: She is oriented to person, place, and time. She appears well-developed.  HENT:  Head: Normocephalic and atraumatic.  Eyes: Conjunctivae and EOM are normal.  Neck: Neck supple.  Cardiovascular: Normal rate and regular rhythm.   Pulmonary/Chest: Effort normal and breath sounds normal.  Abdominal: Soft. Bowel sounds are normal. There is no tenderness.  Musculoskeletal: Normal range of motion.  Neurological: She is alert and oriented to person, place, and time.  Skin: Skin is warm and dry.  Psychiatric: She has a normal mood and affect.    ED Course  Procedures (including critical care time)  Labs Reviewed  URINALYSIS, ROUTINE W REFLEX MICROSCOPIC - Abnormal; Notable for the following:    Ketones, ur 15 (*)    All other components within normal limits  CBC - Abnormal; Notable for the following:    RBC 5.35 (*)    Hemoglobin 16.5 (*)    HCT 48.3 (*)    All other components within normal limits  BASIC METABOLIC PANEL  TROPONIN I   Dg Chest 2 View  09/08/2011  *RADIOLOGY REPORT*  Clinical Data: Vomiting and weakness  CHEST - 2 VIEW  Comparison: None  Findings: Heart size and mediastinal contours are normal.  No pleural effusion or edema.  No airspace consolidation identified.  Visualized osseous structures appear intact.  IMPRESSION:  1.  No active cardiopulmonary abnormalities.  Original Report Authenticated By: Rosealee Albee, M.D.    Date: 09/08/2011  Rate: 75  Rhythm: normal sinus rhythm  QRS Axis: right  Intervals: normal  ST/T Wave abnormalities: normal  Conduction Disutrbances:none  Narrative Interpretation:   Old EKG Reviewed:  unchanged     1. Vomiting   2. Chest pain       MDM  Pt is feeling better at this time after fluids toradol and zofran:pt is tolerating po without any problems:symptoms likely viral:doubt acs:pt is cp free today        Teressa Lower, NP 09/08/11 1502

## 2011-09-08 NOTE — ED Notes (Signed)
Pt. Asking for torado.  NP notified.  NP asking for UA on Pt.

## 2011-09-08 NOTE — ED Notes (Signed)
Patient and her family refused to leave, requesting additional IV fluids, banana bag of fluid , xrays and additional test to find out what is wrong with her. States she chronically takes 8-10 pills of percocet daily for chronic back pain .  States for the last 2 weeks she has not had a bm and vomits each time she takes her pain medications.  States she needs back surgery but her doctor will not due her surgery until she feels better.  Discussion given on increasing her fluid intake and increasing fruits and vegies and using miralax.  Referral given to Dr. Rodena Medin and Dr. Gwynneth Albright for primary care.  Patient and family were calmer and verbalized understanding and allowed this nurse to take out her iv.  Update given to V. Pickering.

## 2011-09-28 ENCOUNTER — Encounter (HOSPITAL_COMMUNITY)
Admission: RE | Disposition: A | Discharge status: Home / Self Care | Payer: Self-pay | Source: Ambulatory Visit | Attending: Gastroenterology

## 2011-09-28 ENCOUNTER — Ambulatory Visit (HOSPITAL_COMMUNITY)
Admission: RE | Admit: 2011-09-28 | Discharge: 2011-09-28 | Disposition: A | Discharge status: Home / Self Care | Payer: 59 | Source: Ambulatory Visit | Attending: Gastroenterology | Admitting: Gastroenterology

## 2011-09-28 ENCOUNTER — Encounter (HOSPITAL_COMMUNITY): Payer: Self-pay | Admitting: *Deleted

## 2011-09-28 DIAGNOSIS — K2970 Gastritis, unspecified, without bleeding: Secondary | ICD-10-CM | POA: Insufficient documentation

## 2011-09-28 DIAGNOSIS — R112 Nausea with vomiting, unspecified: Secondary | ICD-10-CM | POA: Insufficient documentation

## 2011-09-28 DIAGNOSIS — K921 Melena: Secondary | ICD-10-CM | POA: Insufficient documentation

## 2011-09-28 HISTORY — DX: Unspecified osteoarthritis, unspecified site: M19.90

## 2011-09-28 HISTORY — PX: ESOPHAGOGASTRODUODENOSCOPY: SHX5428

## 2011-09-28 HISTORY — DX: Nausea with vomiting, unspecified: R11.2

## 2011-09-28 HISTORY — DX: Depression, unspecified: F32.A

## 2011-09-28 HISTORY — DX: Adverse effect of unspecified anesthetic, initial encounter: T41.45XA

## 2011-09-28 HISTORY — DX: Other specified postprocedural states: Z98.890

## 2011-09-28 HISTORY — DX: Headache: R51

## 2011-09-28 HISTORY — DX: Major depressive disorder, single episode, unspecified: F32.9

## 2011-09-28 HISTORY — DX: Other complications of anesthesia, initial encounter: T88.59XA

## 2011-09-28 HISTORY — DX: Anxiety disorder, unspecified: F41.9

## 2011-09-28 SURGERY — EGD (ESOPHAGOGASTRODUODENOSCOPY)
Anesthesia: Moderate Sedation

## 2011-09-28 MED ORDER — FENTANYL CITRATE 0.05 MG/ML IJ SOLN
INTRAMUSCULAR | Status: AC
  Filled 2011-09-28: qty 2

## 2011-09-28 MED ORDER — ONDANSETRON HCL 4 MG/2ML IJ SOLN
4.0000 mg | Freq: Once | INTRAMUSCULAR | Status: AC
  Administered 2011-09-28: 4 mg via INTRAVENOUS

## 2011-09-28 MED ORDER — FENTANYL NICU IV SYRINGE 50 MCG/ML
INJECTION | INTRAMUSCULAR | Status: DC | PRN
  Administered 2011-09-28 (×3): 25 ug via INTRAVENOUS

## 2011-09-28 MED ORDER — PROMETHAZINE HCL 25 MG/ML IJ SOLN
INTRAMUSCULAR | Status: AC
  Filled 2011-09-28: qty 1

## 2011-09-28 MED ORDER — MIDAZOLAM HCL 10 MG/2ML IJ SOLN
INTRAMUSCULAR | Status: DC | PRN
  Administered 2011-09-28 (×3): 2 mg via INTRAVENOUS

## 2011-09-28 MED ORDER — SODIUM CHLORIDE 0.9 % IV SOLN
Freq: Once | INTRAVENOUS | Status: AC
  Administered 2011-09-28: 09:00:00 via INTRAVENOUS

## 2011-09-28 MED ORDER — ONDANSETRON HCL 4 MG/2ML IJ SOLN
INTRAMUSCULAR | Status: AC
  Filled 2011-09-28: qty 2

## 2011-09-28 MED ORDER — BUTAMBEN-TETRACAINE-BENZOCAINE 2-2-14 % EX AERO
INHALATION_SPRAY | CUTANEOUS | Status: DC | PRN
  Administered 2011-09-28: 2 via TOPICAL

## 2011-09-28 MED ORDER — PROMETHAZINE HCL 25 MG/ML IJ SOLN
INTRAMUSCULAR | Status: DC | PRN
  Administered 2011-09-28: 12.5 mg via INTRAVENOUS

## 2011-09-28 MED ORDER — MIDAZOLAM HCL 10 MG/2ML IJ SOLN
INTRAMUSCULAR | Status: AC
  Filled 2011-09-28: qty 2

## 2011-09-28 NOTE — Discharge Instructions (Addendum)
Esophagogastroduodenoscopy This is an endoscopic procedure (a procedure that uses a device like a flexible telescope) that allows your caregiver to view the upper stomach and small bowel. This test allows your caregiver to look at the esophagus. The esophagus carries food from your mouth to your stomach. They can also look at your duodenum. This is the first part of the small intestine that attaches to the stomach. This test is used to detect problems in the bowel such as ulcers and inflammation. PREPARATION FOR TEST Nothing to eat after midnight the day before the test. NORMAL FINDINGS Normal esophagus, stomach, and duodenum. Ranges for normal findings may vary among different laboratories and hospitals. You should always check with your doctor after having lab work or other tests done to discuss the meaning of your test results and whether your values are considered within normal limits. MEANING OF TEST  Your caregiver will go over the test results with you and discuss the importance and meaning of your results, as well as treatment options and the need for additional tests if necessary. OBTAINING THE TEST RESULTS It is your responsibility to obtain your test results. Ask the lab or department performing the test when and how you will get your results. Document Released: 09/29/2004 Document Revised: 05/18/2011 Document Reviewed: 05/08/2008 ExitCare Patient Information 2012 [place], [place]  Dr. Dulce Sellar will review the path reports and will call and arrange further diagnostic testing.

## 2011-09-28 NOTE — Op Note (Signed)
[facility] [address] [place], [place]  [address]  ENDOSCOPY PROCEDURE REPORT  PATIENT:  [name], [name]  MR#:  [id number] BIRTHDATE:  11/23/1955, 55 yrs. old  GENDER:  female  ENDOSCOPIST:  Carman Ching Referred by:  Merri Brunette, M.D.  PROCEDURE DATE:  09/28/2011 PROCEDURE:  EGD with biopsy, [id number] ASA CLASS:  Class II INDICATIONS:  blood in stool , nausea and vomiting  MEDICATIONS:   Fentanyl 75 mcg IV, Versed 6 mg IV, promethazine (Phenergan) 25 TOPICAL ANESTHETIC:  Cetacaine Spray  DESCRIPTION OF PROCEDURE:   After the risks and benefits of the procedure were explained, informed consent was obtained.  The Pentax Gastroscope [id number] endoscope was introduced through the mouth and advanced to the second portion of the duodenum.  The instrument was slowly withdrawn as the mucosa was fully examined. <<PROCEDUREIMAGES>>  the duodenal bulb and second duodenum were without abnormalities. With standard forceps, a biopsy was obtained and sent to pathology. duodenum biopsied for celiac disease  Mild gastritis was found in the antrum. With standard forceps, a biopsy was obtained and sent to pathology.  nl esophagus.    Retroflexed views revealed no abnormalities.    The scope was then withdrawn from the patient and the procedure completed.  COMPLICATIONS:  A complication of none occurred on 09/28/2011 at.  ENDOSCOPIC IMPRESSION: 1) Nl duodenum 2) Mild gastritis in the antrum 3) Nl esophagus nothing to explain the patient's persistent nausea and vomiting. Her gastric outlet was wide open. RECOMMENDATIONS: will continue on the same medications for now and check on the results of the biopsy. Dr. Dulce Sellar will call and arrange further testing.  ______________________________ Carman Ching  CC:  Merri Brunette, MD  n. Rosalie DoctorCarman Ching at 09/28/2011 10:47 AM  Wyline Mood, [id number]

## 2011-09-29 ENCOUNTER — Encounter (HOSPITAL_COMMUNITY): Payer: Self-pay | Admitting: Gastroenterology

## 2011-10-02 ENCOUNTER — Telehealth: Payer: Self-pay

## 2011-10-02 NOTE — Telephone Encounter (Signed)
I have left a message for the patient to call back about scheduling an appt with Dr Russella Dar for weight loss and persistent vomiting

## 2011-10-03 ENCOUNTER — Ambulatory Visit (INDEPENDENT_AMBULATORY_CARE_PROVIDER_SITE_OTHER): Payer: 59 | Admitting: Gastroenterology

## 2011-10-03 ENCOUNTER — Encounter: Payer: Self-pay | Admitting: Gastroenterology

## 2011-10-03 VITALS — BP 98/58 | HR 100 | Ht 64.0 in | Wt 105.0 lb

## 2011-10-03 DIAGNOSIS — K921 Melena: Secondary | ICD-10-CM

## 2011-10-03 DIAGNOSIS — R112 Nausea with vomiting, unspecified: Secondary | ICD-10-CM | POA: Insufficient documentation

## 2011-10-03 DIAGNOSIS — R634 Abnormal weight loss: Secondary | ICD-10-CM

## 2011-10-03 DIAGNOSIS — K5900 Constipation, unspecified: Secondary | ICD-10-CM

## 2011-10-03 MED ORDER — MOVIPREP 100 G PO SOLR: 1.0000 | Freq: Once | ORAL | Status: DC

## 2011-10-03 MED ORDER — METOCLOPRAMIDE HCL 10 MG PO TABS: ORAL_TABLET | ORAL | Status: DC

## 2011-10-03 MED ORDER — DEXLANSOPRAZOLE 60 MG PO CPDR: 60.0000 mg | DELAYED_RELEASE_CAPSULE | Freq: Every day | ORAL | Status: DC

## 2011-10-03 NOTE — Telephone Encounter (Signed)
Left message for patient to call back  

## 2011-10-03 NOTE — Patient Instructions (Signed)
Your physician has requested that you go to the basement for the following lab work before leaving today: Cmet, Lipase, TSH. You have been scheduled for a colonoscopy with propofol. Please follow written instructions given to you at your visit today.  Please pick up your prep kit at the pharmacy within the next 1-3 days. We have sent the following medications to your pharmacy for you to pick up at your convenience:Relgan to take before your procedure and the Movi prep. Start Dexilant samples one tablet by mouth once daily.  You have been scheduled for an abdominal ultrasound at [facility] (1st floor of hospital) on 10/06/11 at 9:00am. Please arrive 15 minutes prior to your appointment for registration. Make certain not to have anything to eat or drink 6 hours prior to your appointment. Should you need to reschedule your appointment, please contact radiology at [phone]. cc: Feliciana Rossetti, MD

## 2011-10-03 NOTE — Telephone Encounter (Signed)
Patient had endo at hospital on 09/28/11.  Records are in the chart.  She will come in and see Dr Russella Dar today at 2:00

## 2011-10-03 NOTE — Progress Notes (Signed)
History of Present Illness: This is a 56 year old female who has had problems with persistent nausea and vomiting for 2 years. Her nausea and vomiting started when she began taking narcotics for relief of chronic back and right hip pain. She takes Zofran, along with her daily narcotics, for the past 2 years to minimize nausea and vomiting. She has recently had a 10 pound weight loss. She noted dark, coffee-ground-like stools for 2 or 3 days and was evaluated by Dr. Matthias Hughs and underwent upper endoscopy by Dr. Randa Evens that revealed only mild antral gastritis. She has a history of postoperative nausea and vomiting. She has chronic constipation that is fairly well controlled with daily MiraLax. She has ongoing fatigue. She was evaluated for possible acalculous cholecystitis by Dr. Matthias Hughs and had a normal gallbladder ejection fraction at 69% in 2000. Abdominal ultrasound in 2000 was unremarkable except for small hepatic lesion. MRI in 2000 revealed this lesion to be a small hemangioma. Denies abdominal pain, diarrhea, change in stool caliber, melena, hematochezia, dysphagia, reflux symptoms, chest pain.  No Known Allergies Outpatient Prescriptions Prior to Visit  Medication Sig Dispense Refill  . Ondansetron HCl (ZOFRAN PO) Take by mouth.      . Oxycodone-Acetaminophen (PERCOCET PO) Take by mouth.      Marland Kitchen GABAPENTIN PO Take by mouth.       Past Medical History  Diagnosis Date  . Chronic pain     back and right hip  . Complication of anesthesia   . PONV (postoperative nausea and vomiting)   . Headache   . DJD (degenerative joint disease)   . Depression   . Anxiety    Past Surgical History  Procedure Date  . Back surgery   . Hip surgery   . Tonsillectomy   . Knee surgery   . Esophagogastroduodenoscopy 09/28/2011    Procedure: ESOPHAGOGASTRODUODENOSCOPY (EGD);  Surgeon: Vertell Novak., MD;  Location: [facility];  Service: Endoscopy;  Laterality: N/A;   History   Social History  .  Marital Status: Married    Spouse Name: N/A    Number of Children: 3  . Years of Education: N/A   Occupational History  . 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 Smoking status: Current Everyday Smoker -- 0.5 packs/day for 16 years    Types: Cigarettes  . Smokeless tobacco: Never Used  . Alcohol Use: No  . Drug Use: No  . Sexually Active:    Other Topics Concern  . None   Social History Narrative  . None   Family History  Problem Relation Age of Onset  . Anesthesia problems Mother   . Breast cancer Maternal Aunt   . Ovarian cancer Maternal Grandmother   .        Review of Systems: Pertinent positive and negative review of systems were noted in the above HPI section. All other review of systems were otherwise negative.  Physical Exam: General: Well developed , well nourished, thin, no acute distress Head: Normocephalic and atraumatic Eyes:  sclerae anicteric, EOMI Ears: Normal auditory acuity Mouth: No deformity or lesions Neck: Supple, no masses or thyromegaly Lungs: Clear throughout to auscultation Heart: Regular rate and rhythm; no murmurs, rubs or bruits Abdomen: Soft, generalized mild tenderness, more so on the right abdomen. without rebound or guarding and non distended. No masses, hepatosplenomegaly or hernias noted. Normal Bowel sounds Rectal: No lesions, Hemoccult negative stool in the vault Musculoskeletal: Symmetrical with no gross deformities  Skin:  No lesions on visible extremities Pulses:  Normal pulses noted Extremities: No clubbing, cyanosis, edema or deformities noted Neurological: Alert oriented x 4, grossly nonfocal Cervical Nodes:  No significant cervical adenopathy Inguinal Nodes: No significant inguinal adenopathy Psychological:  Alert and cooperative. Normal mood and affect  Assessment and Recommendations:  1. Chronic nausea and vomiting associated with narcotic pain medications. I have asked her to address  these symptoms with her pain management clinic as they are related to narcotics. Continue Zofran. Schedule ultrasound to rule out biliary disease. CMET, lipase, TSH. A recent CBC was unremarkable.  2. Mild gastritis noted on recent upper endoscopy. Begin Dexilant 60 mg daily. Discontinue Carafate.  3. Weight loss-etiology unclear and recent dark stools. Schedule colonoscopy. The risks, benefits, and alternatives to colonoscopy with possible biopsy and possible polypectomy were discussed with the patient and they consent to proceed.   4. Abdominal pain. Abdominal ultrasound as above. Increase frequency in dose of MiraLax as she may have ongoing constipation with incomplete evacuation.    5. Hepatic hemangioma.

## 2011-10-04 ENCOUNTER — Encounter: Payer: Self-pay | Admitting: Gastroenterology

## 2011-10-04 ENCOUNTER — Other Ambulatory Visit (INDEPENDENT_AMBULATORY_CARE_PROVIDER_SITE_OTHER): Payer: 59

## 2011-10-04 ENCOUNTER — Ambulatory Visit (AMBULATORY_SURGERY_CENTER): Payer: 59 | Admitting: Gastroenterology

## 2011-10-04 VITALS — BP 111/70 | HR 80 | Temp 98.0°F | Resp 18 | Ht 64.0 in | Wt 98.0 lb

## 2011-10-04 DIAGNOSIS — D128 Benign neoplasm of rectum: Secondary | ICD-10-CM

## 2011-10-04 DIAGNOSIS — R112 Nausea with vomiting, unspecified: Secondary | ICD-10-CM

## 2011-10-04 DIAGNOSIS — K921 Melena: Secondary | ICD-10-CM

## 2011-10-04 DIAGNOSIS — R1084 Generalized abdominal pain: Secondary | ICD-10-CM

## 2011-10-04 DIAGNOSIS — R634 Abnormal weight loss: Secondary | ICD-10-CM

## 2011-10-04 DIAGNOSIS — D129 Benign neoplasm of anus and anal canal: Secondary | ICD-10-CM

## 2011-10-04 LAB — COMPREHENSIVE METABOLIC PANEL
ALT: 21 U/L (ref 0–35)
AST: 24 U/L (ref 0–37)
Creatinine, Ser: 0.6 mg/dL (ref 0.4–1.2)
Sodium: 141 mEq/L (ref 135–145)
Total Bilirubin: 0.4 mg/dL (ref 0.3–1.2)
Total Protein: 6.4 g/dL (ref 6.0–8.3)

## 2011-10-04 MED ORDER — SODIUM CHLORIDE 0.9 % IV SOLN: 500.0000 mL | INTRAVENOUS | Status: DC

## 2011-10-04 NOTE — Progress Notes (Signed)
Patient did not experience any of the following events: a burn prior to discharge; a fall within the facility; wrong site/side/patient/procedure/implant event; or a hospital transfer or hospital admission upon discharge from the facility. (G8907) Patient did not have preoperative order for IV antibiotic SSI prophylaxis. (G8918)  

## 2011-10-04 NOTE — Op Note (Signed)
[facility] [address] [place], [place]  [address]  COLONOSCOPY PROCEDURE REPORT  PATIENT:  Michaela Hall, Michaela Hall  MR#:  [id number] BIRTHDATE:  1956-04-26, 55 yrs. old  GENDER:  female ENDOSCOPIST:  Judie Petit T. Russella Dar, MD, [facility]  PROCEDURE DATE:  10/04/2011 PROCEDURE:  Colonoscopy with biopsy ASA CLASS:  Class II INDICATIONS:  1) hematochezia  2) weight loss  3) abdominal pain 4) constipation MEDICATIONS:   MAC sedation, administered by CRNA, propofol (Diprivan) 400 mg IV DESCRIPTION OF PROCEDURE:   After the risks benefits and alternatives of the procedure were thoroughly explained, informed consent was obtained.  Digital rectal exam was performed and revealed no abnormalities.   The LB [id number] endoscope was introduced through the anus and advanced to the terminal ileum which was intubated for a short distance, limited by fair prep. The quality of the prep was Moviprep fair.  The instrument was then slowly withdrawn as the colon was fully examined. <<PROCEDUREIMAGES>> FINDINGS:  A nodule was found at the appendiceal orifice. It was rounded benign appearing, friable and erythematous. Multiple biopsies were obtained and sent to pathology.  Otherwise normal colonoscopy without other polyps, masses, vascular ectasias, or inflammatory changes.  The terminal ileum appeared normal. Retroflexed views in the rectum revealed internal hemorrhoids, small.  The time to cecum =  3  minutes. The scope was then withdrawn (time =  16.75  min) from the patient and the procedure completed.  COMPLICATIONS:  None  ENDOSCOPIC IMPRESSION: 1) Nodule at the appendiceal orifice 2) Normal terminal ileum 3) Internal hemorrhoids  RECOMMENDATIONS: 1) Await pathology results 2) Miralaz 1-3 times every day, titrate to need 3) High fiber diet with liberal fluid intake 4) Repeat Colonoscopy in 5 years for colorectal cancer screening with a more extensive bowel prep. 5) CT scan of  abdomen and pelvis  Michaela Hall T. Russella Dar, MD, [facility]  n. eSIGNED:   Venita Lick. Jordis Hall at 10/04/2011 02:36 PM  Wyline Mood, [id number]

## 2011-10-04 NOTE — Patient Instructions (Signed)
Discharge instructions given with verbal understanding. Handout on hemorrhoids given/. Resume previous medications. Office will call  With CT SCAN appointment.YOU HAD AN ENDOSCOPIC PROCEDURE TODAY AT THE [facility]: Refer to the procedure report that was given to you for any specific questions about what was found during the examination.  If the procedure report does not answer your questions, please call your gastroenterologist to clarify.  If you requested that your care partner not be given the details of your procedure findings, then the procedure report has been included in a sealed envelope for you to review at your convenience later.  YOU SHOULD EXPECT: Some feelings of bloating in the abdomen. Passage of more gas than usual.  Walking can help get rid of the air that was put into your GI tract during the procedure and reduce the bloating. If you had a lower endoscopy (such as a colonoscopy or flexible sigmoidoscopy) you may notice spotting of blood in your stool or on the toilet paper. If you underwent a bowel prep for your procedure, then you may not have a normal bowel movement for a few days.  DIET: Your first meal following the procedure should be a light meal and then it is ok to progress to your normal diet.  A half-sandwich or bowl of soup is an example of a good first meal.  Heavy or fried foods are harder to digest and may make you feel nauseous or bloated.  Likewise meals heavy in dairy and vegetables can cause extra gas to form and this can also increase the bloating.  Drink plenty of fluids but you should avoid alcoholic beverages for 24 hours.  ACTIVITY: Your care partner should take you home directly after the procedure.  You should plan to take it easy, moving slowly for the rest of the day.  You can resume normal activity the day after the procedure however you should NOT DRIVE or use heavy machinery for 24 hours (because of the sedation medicines used during the test).     SYMPTOMS TO REPORT IMMEDIATELY: A gastroenterologist can be reached at any hour.  During normal business hours, 8:30 AM to 5:00 PM Monday through Friday, call [phone].  After hours and on weekends, please call the GI answering service at [phone] who will take a message and have the physician on call contact you.   Following lower endoscopy (colonoscopy or flexible sigmoidoscopy):  Excessive amounts of blood in the stool  Significant tenderness or worsening of abdominal pains  Swelling of the abdomen that is new, acute  Fever of 100F or higher  FOLLOW UP: If any biopsies were taken you will be contacted by phone or by letter within the next 1-3 weeks.  Call your gastroenterologist if you have not heard about the biopsies in 3 weeks.  Our staff will call the home number listed on your records the next business day following your procedure to check on you and address any questions or concerns that you may have at that time regarding the information given to you following your procedure. This is a courtesy call and so if there is no answer at the home number and we have not heard from you through the emergency physician on call, we will assume that you have returned to your regular daily activities without incident.  SIGNATURES/CONFIDENTIALITY: You and/or your care partner have signed paperwork which will be entered into your electronic medical record.  These signatures attest to the fact that that the information  above on your After Visit Summary has been reviewed and is understood.  Full responsibility of the confidentiality of this discharge information lies with you and/or your care-partner.  

## 2011-10-05 ENCOUNTER — Telehealth: Payer: Self-pay

## 2011-10-05 ENCOUNTER — Other Ambulatory Visit: Payer: Self-pay

## 2011-10-05 DIAGNOSIS — R7989 Other specified abnormal findings of blood chemistry: Secondary | ICD-10-CM

## 2011-10-05 DIAGNOSIS — R634 Abnormal weight loss: Secondary | ICD-10-CM

## 2011-10-05 NOTE — Telephone Encounter (Signed)
  Follow up Call-  Call back number 10/04/2011  Post procedure Call Back phone  # [phone]  Permission to leave phone message Yes     Patient questions:  Do you have a fever, pain , or abdominal swelling? no Pain Score  0 *  Have you tolerated food without any problems? yes  Have you been able to return to your normal activities? yes  Do you have any questions about your discharge instructions: Diet   no Medications  no Follow up visit  no  Do you have questions or concerns about your Care? no  Actions: * If pain score is 4 or above: No action needed, pain <4.

## 2011-10-06 ENCOUNTER — Ambulatory Visit (HOSPITAL_COMMUNITY)
Admission: RE | Admit: 2011-10-06 | Discharge: 2011-10-06 | Disposition: A | Discharge status: Home / Self Care | Payer: 59 | Source: Ambulatory Visit | Attending: Gastroenterology | Admitting: Gastroenterology

## 2011-10-06 DIAGNOSIS — R112 Nausea with vomiting, unspecified: Secondary | ICD-10-CM | POA: Insufficient documentation

## 2011-10-06 DIAGNOSIS — Q619 Cystic kidney disease, unspecified: Secondary | ICD-10-CM | POA: Insufficient documentation

## 2011-10-06 DIAGNOSIS — R634 Abnormal weight loss: Secondary | ICD-10-CM

## 2011-10-06 DIAGNOSIS — K8689 Other specified diseases of pancreas: Secondary | ICD-10-CM | POA: Insufficient documentation

## 2011-10-06 DIAGNOSIS — K921 Melena: Secondary | ICD-10-CM | POA: Insufficient documentation

## 2011-10-09 ENCOUNTER — Encounter: Payer: Self-pay | Admitting: Gastroenterology

## 2011-10-10 ENCOUNTER — Ambulatory Visit (INDEPENDENT_AMBULATORY_CARE_PROVIDER_SITE_OTHER): Payer: 59 | Admitting: Internal Medicine

## 2011-10-10 ENCOUNTER — Encounter: Payer: Self-pay | Admitting: Internal Medicine

## 2011-10-10 ENCOUNTER — Ambulatory Visit (HOSPITAL_COMMUNITY)
Admission: RE | Admit: 2011-10-10 | Discharge: 2011-10-10 | Disposition: A | Discharge status: Home / Self Care | Payer: 59 | Source: Ambulatory Visit | Attending: Internal Medicine | Admitting: Internal Medicine

## 2011-10-10 ENCOUNTER — Ambulatory Visit (INDEPENDENT_AMBULATORY_CARE_PROVIDER_SITE_OTHER)
Admission: RE | Admit: 2011-10-10 | Discharge: 2011-10-10 | Disposition: A | Discharge status: Home / Self Care | Payer: 59 | Source: Ambulatory Visit | Attending: Gastroenterology | Admitting: Gastroenterology

## 2011-10-10 VITALS — BP 108/68 | HR 89 | Temp 98.3°F | Wt 103.0 lb

## 2011-10-10 DIAGNOSIS — K5900 Constipation, unspecified: Secondary | ICD-10-CM

## 2011-10-10 DIAGNOSIS — R634 Abnormal weight loss: Secondary | ICD-10-CM

## 2011-10-10 DIAGNOSIS — R259 Unspecified abnormal involuntary movements: Secondary | ICD-10-CM

## 2011-10-10 DIAGNOSIS — F411 Generalized anxiety disorder: Secondary | ICD-10-CM

## 2011-10-10 DIAGNOSIS — R251 Tremor, unspecified: Secondary | ICD-10-CM

## 2011-10-10 DIAGNOSIS — F419 Anxiety disorder, unspecified: Secondary | ICD-10-CM

## 2011-10-10 DIAGNOSIS — E0590 Thyrotoxicosis, unspecified without thyrotoxic crisis or storm: Secondary | ICD-10-CM

## 2011-10-10 DIAGNOSIS — R109 Unspecified abdominal pain: Secondary | ICD-10-CM

## 2011-10-10 MED ORDER — CITALOPRAM HYDROBROMIDE 20 MG PO TABS: 20.0000 mg | ORAL_TABLET | Freq: Every day | ORAL | Status: DC

## 2011-10-10 MED ORDER — IOHEXOL 300 MG/ML  SOLN
100.0000 mL | Freq: Once | INTRAMUSCULAR | Status: AC | PRN
  Administered 2011-10-10: 100 mL via INTRAVENOUS

## 2011-10-10 NOTE — Patient Instructions (Addendum)
Citalopram, half tablet every night for one week, then one tablet every night --------- Please get your x-ray at the other [place]  office located at: [address] [place], across from [facility].  Please go to the basement, this is a walk-in facility, they are open from 8:30 to 5:30 PM. Phone number [phone].

## 2011-10-10 NOTE — Progress Notes (Signed)
Subjective:    Patient ID: Michaela Hall, female    DOB: Jul 04, 1955, 56 y.o.   MRN: [id number]  HPI New patient, her previous PCP was Mrs. Doylene Canning, a PA in [place] The patient comes here with several issues: ---She has lost 20 pounds in the last 6 months, the majority of the weight loss was in February (10 pounds); she is a smoker, denies any fever or chills, no cough. ---she has chronic nausea and vomiting, recently saw GI, sx  felt to be due to pain medications. She has self discontinued Neurontin because she thought it could  Be the  Cause of  nausea. ---Has night sweats and colds. ---Also reports anxiety, symptoms are more noticeable in the last 2 or 3 months, she started to see a counselor or 3 months ago, is helping some however has not been able to identify a trigger of her anxiety. She is tearful at times, occasionally feel depressed but she's not suicidal. She reports being depressed severely only one time in her life in the 90s when she lost her mother, at the time she took Paxil with   relief. Her previous PCP has prescribed Xanax at times. ---Also she has palpitations almost for 2 months, palpitations are felt almost all the time, no associated chest pain or shortness of breath or fainting spells. --- Approximately 10 days ago, apparently had an episode of gross hematuria " urine looked like coffee", symptoms resolved   Past medical history Chronic pain DJD Depression, anxiety Headaches Chronic nausea, started ~ 2011,GI eval 09-2011: related to pain meds Hyperlipidemia Early menopause age 67   Past surgical history L4-L5 laminectomy 1990, 1992 Left knee reconstruction 1999 Hip arthroscopy 2013 Tonsillectomy  Social history Married, 3 children. Occupation, Advertising copywriter Tobacco--  ~ 1/5 ppd  Alcohol-- no Drugs-- no   Family history Colon ca--no Breast ca-- aunt Strokes-- M SAH at age 24  CAD-- no  Review of Systems See HPI    Objective:   Physical  Exam  General -- alert, well-developed, and well-nourished.   Neck --no thyromegaly , normal carotid pulse Lungs -- normal respiratory effort, no intercostal retractions, no accessory muscle use, and normal breath sounds.   Heart-- normal rate, regular rhythm, no murmur, and no gallop.   Abdomen--soft, non-tender, no distention,  no HSM, no guarding, and no rigidity.  Palpable, nontender aorta, no bruit. Extremities-- no pretibial edema bilaterally Neurologic-- alert & oriented X3 and strength normal in all extremities. She does have some tremor , symmetric in her hands Psych-- Cognition and judgment appear intact. Alert and cooperative with normal attention span and concentration. Moderately  anxious appearing and not depressed appearing.        Assessment & Plan:  New  patient, presents with several issues. Anxiety Hyperthyroidism Weight loss Tremor Palpitations Nausea vomiting  Recent episodes of gross hematuria, see history of present illness  Chart is reviewed. CBC normal 09/08/2011 EKG normal 09-13-11 10/06/2011 BMP okay, LFTs normal, TSH slightly decreased. Recent colonoscopy normal. Biopsy pending. Recent abdominal ultrasound showing normal aorta, some pancreatic ductal dilatation. Had a CT today, results pending.  Plan:  1. I'm planning to investigate her hyperthyroidism further with a  repeated TSH, T3 and T4. Patient to keep her appointment with endocrinology. Hyperparathyroidism could potentially explain many of her symptoms. 2. Anxiety, that seems to be one of the major problems for this patient. Treatment options discussed. Needs to continue talking with her counselor . We'll start citalopram 3. Weight loss, probably multifactorial  including anxiety and hyperthyroidism. She is also a smoker, we'll get a chest x-ray. 4. Nausea and vomiting, ongoing issue, GI  recommended to discuss with his pain management doctor. 5. Tremor, may be related to thyroid disease. 6.  Palpitations, reassess on return to the office. 7. Recent gross hematuria? CT of the abdomen pending.  Today , I spent more than 35  min with the patient, >50% of the time counseling, and   reviewing the chart and labs ordered by other providers

## 2011-10-11 LAB — TSH: TSH: 0.02 u[IU]/mL — ABNORMAL LOW (ref 0.35–5.50)

## 2011-10-11 LAB — T3, FREE: T3, Free: 3.2 pg/mL (ref 2.3–4.2)

## 2011-10-31 ENCOUNTER — Ambulatory Visit (INDEPENDENT_AMBULATORY_CARE_PROVIDER_SITE_OTHER): Payer: 59 | Admitting: Internal Medicine

## 2011-10-31 ENCOUNTER — Telehealth: Payer: Self-pay | Admitting: Internal Medicine

## 2011-10-31 MED ORDER — HYDROXYZINE HCL 10 MG PO TABS: 10.0000 mg | ORAL_TABLET | Freq: Every evening | ORAL | Status: DC | PRN

## 2011-10-31 NOTE — Telephone Encounter (Signed)
Patient reports she tried Atarax that I prescribed to her daughter and it worked really well for her. Prescription provided today, to be used prn for insomnia

## 2011-11-07 ENCOUNTER — Ambulatory Visit (INDEPENDENT_AMBULATORY_CARE_PROVIDER_SITE_OTHER): Payer: 59 | Admitting: Family

## 2011-11-07 ENCOUNTER — Encounter: Payer: Self-pay | Admitting: Family

## 2011-11-07 VITALS — BP 104/58 | HR 68 | Temp 98.3°F | Resp 16 | Wt 102.0 lb

## 2011-11-07 DIAGNOSIS — J40 Bronchitis, not specified as acute or chronic: Secondary | ICD-10-CM

## 2011-11-07 MED ORDER — GUAIFENESIN ER 600 MG PO TB12: 600.0000 mg | ORAL_TABLET | Freq: Two times a day (BID) | ORAL | Status: DC | PRN

## 2011-11-07 MED ORDER — PREDNISONE 10 MG PO TABS: ORAL_TABLET | ORAL | Status: DC

## 2011-11-07 MED ORDER — CEFUROXIME AXETIL 500 MG PO TABS: 500.0000 mg | ORAL_TABLET | Freq: Two times a day (BID) | ORAL | Status: AC

## 2011-11-07 MED ORDER — ALBUTEROL SULFATE (2.5 MG/3ML) 0.083% IN NEBU: 2.5000 mg | INHALATION_SOLUTION | Freq: Four times a day (QID) | RESPIRATORY_TRACT | Status: DC | PRN

## 2011-11-07 MED ORDER — BENZONATATE 100 MG PO CAPS: 100.0000 mg | ORAL_CAPSULE | Freq: Three times a day (TID) | ORAL | Status: AC | PRN

## 2011-11-07 NOTE — Assessment & Plan Note (Signed)
Patient is a smoker.  While I do not hear any adventitious lung sounds/wheezing, I suspect some associated bronchospasm.  Will plan to treat with ceftin and add pred taper.  Will also add tessalon prn cough.  If symptoms worsen, or if no improvement in 2-3 days, pt is instructed to let us know.

## 2011-11-07 NOTE — Progress Notes (Signed)
Subjective:    Patient ID: Michaela Hall, female    DOB: May 09, 1956, 56 y.o.   MRN: [id number]  HPI  Michaela Hall is a 56 yr old female who presents today with chief complaint of cough. She reports that her cough started 3 weeks ago.  She reports that her two children have been sick with similar symptoms. Initially started with "horrific sore throat x 3 days." This was followed by chest congestion and now has "moved to my lungs." No associated fever.  She has tried mucinex and alkaselzer plus and albuterol/cromolyn neb without improvement.   She reports that this was her son's machine.  Medication is "very outdated." Cough is described as dry and hacking.  She is having associated trouble sleeping due to cough.  Pt is a smoker.    She tells me that she was recently diagnosed with hyperthyroid and is now under the care of Endocrinology. Recently started tapazole and reports a 25 pound weight loss recently.   Review of Systems See HPI  Past Medical History  Diagnosis Date  . Chronic pain     back and right hip  . Complication of anesthesia   . PONV (postoperative nausea and vomiting)   . Headache   . DJD (degenerative joint disease)   . Depression   . Anxiety     History   Social History  . Marital Status: Married    Spouse Name: N/A    Number of Children: 3  . Years of Education: N/A   Occupational History  . 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 Smoking status: Current Everyday Smoker -- 0.5 packs/day for 16 years    Types: Cigarettes  . Smokeless tobacco: Never Used  . Alcohol Use: No  . Drug Use: No  . Sexually Active:    Other Topics Concern  . Not on file   Social History Narrative   Last colon screening: 09/2011    Past Surgical History  Procedure Date  . Back surgery   . Hip surgery   . Tonsillectomy   . Knee surgery   . Esophagogastroduodenoscopy 09/28/2011    Procedure: ESOPHAGOGASTRODUODENOSCOPY (EGD);  Surgeon: Vertell Novak., MD;  Location: [facility];  Service: Endoscopy;  Laterality: N/A;    Family History  Problem Relation Age of Onset  . Anesthesia problems Mother   . Cancer Mother     ovary/uterus & breast  . Hyperlipidemia Mother   . Hypertension Mother   . Breast cancer Maternal Aunt   . Ovarian cancer Maternal Grandmother   .     Marland Kitchen Hyperlipidemia Father   . Hypertension Father     No Known Allergies  Current Outpatient Prescriptions on File Prior to Visit  Medication Sig Dispense Refill  . aspirin 81 MG tablet Take 81 mg by mouth daily.      . celecoxib (CELEBREX) 100 MG capsule Take 100 mg by mouth 2 (two) times daily.      . citalopram (CELEXA) 20 MG tablet Take 1 tablet (20 mg total) by mouth daily.  30 tablet  3  . hydrOXYzine (ATARAX/VISTARIL) 10 MG tablet Take 1-2 tablets (10-20 mg total) by mouth at bedtime as needed for itching.  60 tablet  0  . methimazole (TAPAZOLE) 5 MG tablet Take 5 mg by mouth daily.      . Ondansetron HCl (ZOFRAN PO) Take by mouth.      . Oxycodone HCl 10 MG TABS       .  polyethylene glycol (MIRALAX / GLYCOLAX) packet Take 17 g by mouth daily.      . promethazine (PHENERGAN) 25 MG suppository       . albuterol (PROVENTIL) (2.5 MG/3ML) 0.083% nebulizer solution Take 3 mLs (2.5 mg total) by nebulization every 6 (six) hours as needed for wheezing.  150 mL  1    BP 104/58  Pulse 68  Temp(Src) 98.3 F (36.8 C) (Oral)  Resp 16  Wt 102 lb (46.267 kg)  SpO2 98%       Objective:   Physical Exam  Constitutional: She appears well-developed and well-nourished. No distress.  HENT:  Head: Normocephalic and atraumatic.  Right Ear: Tympanic membrane and ear canal normal.  Left Ear: Tympanic membrane and ear canal normal.  Mouth/Throat: No oropharyngeal exudate, posterior oropharyngeal edema or posterior oropharyngeal erythema.  Eyes: Conjunctivae are normal.  Neck: Normal range of motion.  Cardiovascular: Normal rate and regular rhythm.   No  murmur heard. Pulmonary/Chest: Effort normal and breath sounds normal. No respiratory distress. She has no wheezes. She has no rales. She exhibits no tenderness.  Psychiatric: She has a normal mood and affect. Her speech is normal and behavior is normal. Thought content normal.          Assessment & Plan:

## 2011-11-07 NOTE — Patient Instructions (Signed)

## 2011-11-10 ENCOUNTER — Telehealth: Payer: Self-pay | Admitting: Family

## 2011-11-10 NOTE — Telephone Encounter (Signed)
Spoke to pt to follow up.  She reports cough is unchanged. Taking abx and prednisone.  I recommended that she complete cxr today.  She tells me she is out of town until Sunday night. Advised pt to arrange follow up with Dr. Drue Novel early next week. Pt verbalizes understanding.

## 2011-11-17 ENCOUNTER — Ambulatory Visit: Payer: 59 | Admitting: Internal Medicine

## 2011-11-17 ENCOUNTER — Ambulatory Visit (HOSPITAL_BASED_OUTPATIENT_CLINIC_OR_DEPARTMENT_OTHER)
Admission: RE | Admit: 2011-11-17 | Discharge: 2011-11-17 | Disposition: A | Discharge status: Home / Self Care | Payer: 59 | Source: Ambulatory Visit | Attending: Internal Medicine | Admitting: Internal Medicine

## 2011-11-17 DIAGNOSIS — R112 Nausea with vomiting, unspecified: Secondary | ICD-10-CM | POA: Insufficient documentation

## 2011-11-17 DIAGNOSIS — J40 Bronchitis, not specified as acute or chronic: Secondary | ICD-10-CM | POA: Insufficient documentation

## 2011-11-20 ENCOUNTER — Ambulatory Visit: Payer: 59 | Admitting: Internal Medicine

## 2011-11-21 ENCOUNTER — Ambulatory Visit (INDEPENDENT_AMBULATORY_CARE_PROVIDER_SITE_OTHER): Payer: 59 | Admitting: Internal Medicine

## 2011-11-21 ENCOUNTER — Ambulatory Visit: Payer: 59 | Admitting: Internal Medicine

## 2011-11-21 ENCOUNTER — Encounter: Payer: Self-pay | Admitting: Internal Medicine

## 2011-11-21 VITALS — BP 110/72 | HR 83 | Temp 98.6°F | Wt 104.0 lb

## 2011-11-21 DIAGNOSIS — E0590 Thyrotoxicosis, unspecified without thyrotoxic crisis or storm: Secondary | ICD-10-CM

## 2011-11-21 DIAGNOSIS — F419 Anxiety disorder, unspecified: Secondary | ICD-10-CM

## 2011-11-21 DIAGNOSIS — R634 Abnormal weight loss: Secondary | ICD-10-CM

## 2011-11-21 DIAGNOSIS — F411 Generalized anxiety disorder: Secondary | ICD-10-CM

## 2011-11-21 NOTE — Progress Notes (Signed)
  Subjective:    Patient ID: Michaela Hall, female    DOB: 03-13-1956, 56 y.o.   MRN: [id number]  HPI Followup from previous visit: She was started on citalopram for anxiety and feels a lot better, less anxious, no apparent side effects.  A number of records were reviewed which and will be scanned. Diagnosed with shingles 12-2009 History of headaches, reportedly had a normal MRI around 2008, she has been treated with Toradol and Imitrex as needed. H. pylori serology-negative 2011  Past medical history  Chronic pain , per pain management DJD  Depression, anxiety  Headaches  Chronic nausea, started ~ 2011,GI eval 09-2011: related to pain meds  Hyperlipidemia  Early menopause age 40  Shingles 2007 Past surgical history  L4-L5 laminectomy 1990, 1992  Left knee reconstruction 1999  Hip arthroscopy 2013  Tonsillectomy  Social history  Married, 3 children.  Occupation, Advertising copywriter  Tobacco-- ~ 1/5 ppd  Alcohol-- no  Drugs-- no  Family history  Colon ca--no  Breast ca-- aunt  Strokes-- M SAH at age 56  CAD-- no  Review of Systems Since her last office visit, she saw endocrinology, she is now on Tapazole, reports that her tremors and palpitations are decreased but not completely well. She still has "sore throat" mostly when she swallows. Denies any postnasal dripping, fever, GERD symptoms. Appetite has improved, has gained weight, 1 pound since the last visit      Objective:   Physical Exam  General -- alert, well-developed, and well-nourished.  Neck --there is a palpable thyroid gland, slightly tender. Lungs -- normal respiratory effort, no intercostal retractions, no accessory muscle use, and normal breath sounds.  Heart-- normal rate, regular rhythm, no murmur, and no gallop.   Psych-- not anxious or depressed appearing     Assessment & Plan:   Anxiety --- improving on citalopram, we'll reassess in 3-4 months, symptoms may have been related or triggered by  hyperthyroidism, she may not need SSRIs long-term.  Hyperthyroidism --- on Tapazole, followup by endocrinology  Weight loss , Tremor , Palpitations --- likely related to hypothyroidism, improving.  Nausea vomiting --after the CT abdomen showed no GI issues, followup with GI will be prn.  Recent episodes of gross hematuria--recent CT of the abdomen showed a question of a left renal collecting system calculus.

## 2011-11-21 NOTE — Patient Instructions (Signed)
Return in 3 or 4 months for evaluation of anxiety

## 2011-12-28 ENCOUNTER — Other Ambulatory Visit: Payer: Self-pay | Admitting: Internal Medicine

## 2011-12-29 NOTE — Telephone Encounter (Signed)
Refill done.  

## 2012-01-18 ENCOUNTER — Other Ambulatory Visit: Payer: Self-pay | Admitting: Internal Medicine

## 2012-01-19 NOTE — Telephone Encounter (Signed)
Refill done.  

## 2012-04-09 ENCOUNTER — Emergency Department (HOSPITAL_COMMUNITY): Payer: 59

## 2012-04-09 ENCOUNTER — Emergency Department (HOSPITAL_COMMUNITY)
Admission: EM | Admit: 2012-04-09 | Discharge: 2012-04-09 | Disposition: A | Discharge status: Home / Self Care | Payer: 59 | Attending: Emergency Medicine | Admitting: Emergency Medicine

## 2012-04-09 ENCOUNTER — Encounter (HOSPITAL_COMMUNITY): Payer: Self-pay | Admitting: Emergency Medicine

## 2012-04-09 DIAGNOSIS — R51 Headache: Secondary | ICD-10-CM | POA: Insufficient documentation

## 2012-04-09 DIAGNOSIS — Z7982 Long term (current) use of aspirin: Secondary | ICD-10-CM | POA: Insufficient documentation

## 2012-04-09 DIAGNOSIS — G8929 Other chronic pain: Secondary | ICD-10-CM | POA: Insufficient documentation

## 2012-04-09 DIAGNOSIS — Y929 Unspecified place or not applicable: Secondary | ICD-10-CM | POA: Insufficient documentation

## 2012-04-09 DIAGNOSIS — F411 Generalized anxiety disorder: Secondary | ICD-10-CM | POA: Insufficient documentation

## 2012-04-09 DIAGNOSIS — M545 Low back pain, unspecified: Secondary | ICD-10-CM

## 2012-04-09 DIAGNOSIS — M25519 Pain in unspecified shoulder: Secondary | ICD-10-CM

## 2012-04-09 DIAGNOSIS — F329 Major depressive disorder, single episode, unspecified: Secondary | ICD-10-CM | POA: Insufficient documentation

## 2012-04-09 DIAGNOSIS — F3289 Other specified depressive episodes: Secondary | ICD-10-CM | POA: Insufficient documentation

## 2012-04-09 DIAGNOSIS — M1990 Unspecified osteoarthritis, unspecified site: Secondary | ICD-10-CM | POA: Insufficient documentation

## 2012-04-09 DIAGNOSIS — M25539 Pain in unspecified wrist: Secondary | ICD-10-CM

## 2012-04-09 DIAGNOSIS — Z79899 Other long term (current) drug therapy: Secondary | ICD-10-CM | POA: Insufficient documentation

## 2012-04-09 DIAGNOSIS — F17200 Nicotine dependence, unspecified, uncomplicated: Secondary | ICD-10-CM | POA: Insufficient documentation

## 2012-04-09 DIAGNOSIS — T07XXXA Unspecified multiple injuries, initial encounter: Secondary | ICD-10-CM | POA: Insufficient documentation

## 2012-04-09 DIAGNOSIS — Y9389 Activity, other specified: Secondary | ICD-10-CM | POA: Insufficient documentation

## 2012-04-09 MED ORDER — HYDROCODONE-ACETAMINOPHEN 5-325 MG PO TABS
2.0000 | ORAL_TABLET | Freq: Once | ORAL | Status: AC
  Administered 2012-04-09: 2 via ORAL
  Filled 2012-04-09: qty 2

## 2012-04-09 MED ORDER — HYDROCODONE-ACETAMINOPHEN 5-500 MG PO TABS: 1.0000 | ORAL_TABLET | Freq: Four times a day (QID) | ORAL | Status: DC | PRN
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
## 2012-04-11 ENCOUNTER — Other Ambulatory Visit: Payer: Self-pay | Admitting: Internal Medicine

## 2012-04-11 NOTE — Telephone Encounter (Signed)
Refill done.  

## 2012-05-27 ENCOUNTER — Ambulatory Visit (INDEPENDENT_AMBULATORY_CARE_PROVIDER_SITE_OTHER): Payer: 59 | Admitting: Internal Medicine

## 2012-05-27 DIAGNOSIS — Z0289 Encounter for other administrative examinations: Secondary | ICD-10-CM

## 2012-05-27 DIAGNOSIS — R112 Nausea with vomiting, unspecified: Secondary | ICD-10-CM

## 2012-06-06 NOTE — Progress Notes (Signed)
  Subjective:    Patient ID: Michaela Hall, female    DOB: 1956-04-19, 56 y.o.   MRN: [id number]  HPI    Review of Systems     Objective:   Physical Exam        Assessment & Plan:

## 2012-06-07 ENCOUNTER — Other Ambulatory Visit: Payer: Self-pay | Admitting: Internal Medicine

## 2012-06-10 NOTE — Telephone Encounter (Signed)
Refill done.  

## 2012-07-15 ENCOUNTER — Telehealth: Payer: Self-pay | Admitting: Internal Medicine

## 2012-07-15 DIAGNOSIS — E0590 Thyrotoxicosis, unspecified without thyrotoxic crisis or storm: Secondary | ICD-10-CM

## 2012-07-15 NOTE — Telephone Encounter (Signed)
Referral entered  

## 2012-07-15 NOTE — Telephone Encounter (Signed)
Patient states she would like to be referred to Dr. Everardo All. She was previously sent to Dr. Lucianne Muss, but would prefer Dr. Everardo All if possible.

## 2012-07-15 NOTE — Telephone Encounter (Signed)
Please advise 

## 2012-07-15 NOTE — Telephone Encounter (Signed)
Please arrange, DX hyperthyroidism

## 2012-07-31 ENCOUNTER — Ambulatory Visit (INDEPENDENT_AMBULATORY_CARE_PROVIDER_SITE_OTHER): Payer: 59 | Admitting: Endocrinology

## 2012-07-31 VITALS — BP 128/74 | HR 78 | Temp 97.8°F | Wt 111.0 lb

## 2012-07-31 DIAGNOSIS — E0590 Thyrotoxicosis, unspecified without thyrotoxic crisis or storm: Secondary | ICD-10-CM | POA: Insufficient documentation

## 2012-07-31 MED ORDER — ALPRAZOLAM 0.25 MG PO TABS: 0.2500 mg | ORAL_TABLET | Freq: Three times a day (TID) | ORAL | Status: DC | PRN

## 2012-07-31 NOTE — Progress Notes (Signed)
  Subjective:    Patient ID: Michaela Hall, female    DOB: Apr 20, 1956, 57 y.o.   MRN: [id number]  HPI Pt says she was dx'ed with hyperthyroidism 6 mos ago, and was rx'ed with tapazole.  This was stopped 1 month ago, as TFT were better.  However, she still has moderate palpitations in the chest, and assoc excessive diaphoresis.   dtr has hypothyroidism  Review of Systems Pt has hypersomnia, hair loss, fatigue, early menopause, nausea, easy bruising, anxiety, itching, tremor, LOC, and weight loss.  She denies myalgias, sob, diplopia, and fever.      Objective:   Physical Exam tremor       Assessment & Plan:

## 2012-07-31 NOTE — Patient Instructions (Addendum)
Please do blood tests today.  i'll advise you about results. If your thyroid is overactive, i'll request i-131 therapy for you.

## 2012-07-31 NOTE — Progress Notes (Signed)
x

## 2012-08-01 NOTE — Progress Notes (Signed)
Subjective:    Patient ID: Michaela Hall, female    DOB: Oct 25, 1955, 57 y.o.   MRN: [id number]  HPI Pt was dx'ed with hyperthyroidism in mid-2013, and was rx'ed with tapazole.  This was d/c'ed 1 month ago, due to improved TFT.  However, she still has moderate palpitations in the chest, and assoc excessive diaphoresis.   Past Medical History  Diagnosis Date  . Chronic pain     back and right hip  . Complication of anesthesia   . PONV (postoperative nausea and vomiting)   . Headache   . DJD (degenerative joint disease)   . Depression   . Anxiety     Past Surgical History  Procedure Laterality Date  . Back surgery    . Hip surgery    . Tonsillectomy    . Knee surgery    . Esophagogastroduodenoscopy  09/28/2011    Procedure: ESOPHAGOGASTRODUODENOSCOPY (EGD);  Surgeon: Vertell Novak., MD;  Location: [facility];  Service: Endoscopy;  Laterality: N/A;    History   Social History  . Marital Status: Married    Spouse Name: N/A    Number of Children: 3  . Years of Education: N/A   Occupational History  . 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 Smoking status: Current Every Day Smoker -- 0.50 packs/day for 16 years    Types: Cigarettes  . Smokeless tobacco: Never Used  . Alcohol Use: No  . Drug Use: No  . Sexually Active:    Other Topics Concern  . Not on file   Social History Narrative   Last colon screening: 09/2011    Current Outpatient Prescriptions on File Prior to Visit  Medication Sig Dispense Refill  . aspirin 81 MG tablet Take 81 mg by mouth daily.      . citalopram (CELEXA) 20 MG tablet TAKE 1 TABLET BY MOUTH EVERY DAY  30 tablet  1  . ondansetron (ZOFRAN) 4 MG tablet Take 4 mg by mouth every 8 (eight) hours as needed. Nausea      . Oxycodone HCl 10 MG TABS       . tiZANidine (ZANAFLEX) 2 MG tablet Take 2 mg by mouth every 6 (six) hours as needed. Muscle tension      . albuterol (PROVENTIL) (2.5 MG/3ML) 0.083% nebulizer  solution Take 2.5 mg by nebulization every 6 (six) hours as needed. Wheezing      . HYDROcodone-acetaminophen (VICODIN) 5-500 MG per tablet Take 1-2 tablets by mouth every 6 (six) hours as needed for pain.  20 tablet  0   No current facility-administered medications on file prior to visit.   No Known Allergies  Family History  Problem Relation Age of Onset  . Anesthesia problems Mother   . Cancer Mother     ovary/uterus & breast  . Hyperlipidemia Mother   . Hypertension Mother   . Breast cancer Maternal Aunt   . Ovarian cancer Maternal Grandmother   .     Marland Kitchen Hyperlipidemia Father   . Hypertension Father   dtr has hypothyroidism  BP 128/74  Pulse 78  Temp(Src) 97.8 F (36.6 C) (Oral)  Wt 111 lb (50.349 kg)  BMI 19.04 kg/m2  SpO2 96%  Review of Systems Pt reports hypersomnia, fatigue, hair loss, early menopause, anxiety, easy bruising, itching, tremor, myalgias, LOC, and nausea.  She denies sob, weight loss, diplopia, and fever    Objective:   Physical Exam VS: see vs page GEN:  no distress HEAD: head: no deformity eyes: no periorbital swelling, no proptosis external nose and ears are normal mouth: no lesion seen NECK: supple, thyroid is not enlarged CHEST WALL: no deformity LUNGS:  Clear to auscultation CV: reg rate and rhythm, no murmur ABD: abdomen is soft, nontender.  no hepatosplenomegaly.  not distended.  no hernia MUSCULOSKELETAL: muscle bulk and strength are grossly normal.  no obvious joint swelling.  gait is normal and steady. EXTEMITIES: no deformity.  no edema.  PULSES: no carotid bruit. NEURO:  cn 2-12 grossly intact.   readily moves all 4's.  sensation is intact to touch on the feet.  Slight tremor of the hands SKIN:  Normal texture and temperature.  No rash or suspicious lesion is visible.   NODES:  None palpable at the neck PSYCH: alert, oriented x3.  Does not appear anxious nor depressed.  Lab Results  Component Value Date   TSH 0.80 07/31/2012        Assessment & Plan:  Hyperthyroidism, uncertain etiology.  TFT are still normal off tapazole, but hyperthyroidism wil probably recur with time Tremor, not thyroid-related. Weight loss.  This could still be thyroid-related, but it has not resolved with normalization of TFT

## 2012-08-12 ENCOUNTER — Ambulatory Visit (INDEPENDENT_AMBULATORY_CARE_PROVIDER_SITE_OTHER): Payer: 59 | Admitting: Internal Medicine

## 2012-08-12 VITALS — BP 108/72 | HR 84 | Temp 98.2°F | Wt 113.0 lb

## 2012-08-12 DIAGNOSIS — F411 Generalized anxiety disorder: Secondary | ICD-10-CM | POA: Insufficient documentation

## 2012-08-12 DIAGNOSIS — G4700 Insomnia, unspecified: Secondary | ICD-10-CM

## 2012-08-12 DIAGNOSIS — Z780 Asymptomatic menopausal state: Secondary | ICD-10-CM

## 2012-08-12 MED ORDER — FLUOXETINE HCL 20 MG PO TABS: 30.0000 mg | ORAL_TABLET | Freq: Every day | ORAL | Status: DC

## 2012-08-12 NOTE — Progress Notes (Signed)
  Subjective:    Patient ID: Michaela Hall, female    DOB: 10-27-1955, 57 y.o.   MRN: [id number]  HPI Followup Hypothyroidism, closely followed by Dr. Everardo All Menopause, menopausal since age 71, initially had hot flashes, they went away but resurfaced 2-3 weeks ago. Anxiety, not well controlled despite good compliance with Celexa. She is anxious all day, unable to sleep much, has a Xanax but takes it very rarely because make her tired (interestingly, xanax does not help her to  sleep, takes only 1 or 2 tablets when needed) anxiety mostly related to family matters, doing great at work.  Past medical history   Chronic pain , per pain management DJD   Depression, anxiety   Headaches   Chronic nausea, started ~ 2011,GI eval 09-2011: related to pain meds   Hyperlipidemia   Early menopause age 42   Shingles 2007 Past surgical history   L4-L5 laminectomy 1990, 1992   Left knee reconstruction 1999   Hip arthroscopy 2013   Tonsillectomy   Social history   Married, 3 children.   Occupation, [employer]   Tobacco-- ~ 1/5 ppd  --- quit 04-2012 Alcohol-- no   Drugs-- no   Family history   Colon ca--no   Breast ca-- aunt   Strokes-- M SAH at age 60   CAD-- no    Review of Systems Denies depression per se. She has hot flashes but denies weight loss, fever chills. Continue feeling fatigue.     Objective:   Physical Exam General -- alert, well-developed Lungs -- normal respiratory effort, no intercostal retractions, no accessory muscle use, and normal breath sounds.   Heart-- normal rate, regular rhythm, no murmur, and no gallop.    Neurologic-- alert & oriented X3 and strength normal in all extremities. Psych-- Cognition and judgment appear intact. Alert and cooperative with normal attention span and concentration.  not anxious appearing and not depressed appearing.       Assessment & Plan:  Today , I spent more than 25 min with the patient, >50% of the time counseling  regards anxiety and insomnia

## 2012-08-12 NOTE — Patient Instructions (Addendum)
Stop citalopram Start Prozac, one tablet in the morning for 10 days, then take 1.5 tablet in the morning. Take alprazolam 0.25 mg at bedtime only, 3 to 4 tabs  Please read carefully the healthy sleep practices information Come back in one month

## 2012-08-12 NOTE — Assessment & Plan Note (Addendum)
Status citalopram several months ago, initially had a great response however now he is "back to square one". She also saw a counselor but at this point she is unable to go back mostly due to to cost. In the past she tried Prozac had a very good response. She has Xanax, taking it sporadically because she gets  tired Plan: Discontinue citalopram, start Prozac in the morning. Changes Xanax to qhs

## 2012-08-12 NOTE — Assessment & Plan Note (Addendum)
Long history of insomnia. In the past took Ambien, it didn't work very well. She has a muscle relaxant and Xanax, they make her tired and slightly drowsy but not really sleepy. Plan: Treat anxiety Change Xanax 0.25 mg to 3-4 tablets each bedtime. Information about healthy sleep habits provided. Return to the office in one month. Consider trazodone

## 2012-08-12 NOTE — Assessment & Plan Note (Signed)
She has been menopausal since age 57, hot flashes half resurface in the last 2 weeks, unclear why. Plan: Refer to a female gynecologist

## 2012-08-13 ENCOUNTER — Encounter: Payer: Self-pay | Admitting: Internal Medicine

## 2012-09-16 ENCOUNTER — Telehealth: Payer: Self-pay

## 2012-09-16 DIAGNOSIS — E0590 Thyrotoxicosis, unspecified without thyrotoxic crisis or storm: Secondary | ICD-10-CM

## 2012-09-16 NOTE — Telephone Encounter (Signed)
The instructions from the feb blood tests were ov now, but it is ok to come on for blood tests, then have f/u appt in 6 more weeks.  i have ordered.

## 2012-09-16 NOTE — Telephone Encounter (Signed)
x

## 2012-09-16 NOTE — Addendum Note (Signed)
Addended by: Romero Belling on: 09/16/2012 04:52 PM   Modules accepted: Orders

## 2012-09-16 NOTE — Telephone Encounter (Signed)
Pt left voicemail requesting to have thyroid labwork done today, please advise [phone]

## 2012-09-16 NOTE — Telephone Encounter (Signed)
Called patient to schedule OV as instructed. Patient wants to confirm that this is correct. She was under the impression that she needed to come back for labs only. Please confrim / Sherri

## 2012-09-16 NOTE — Telephone Encounter (Signed)
Ov is due let's address then 

## 2012-09-17 ENCOUNTER — Telehealth: Payer: Self-pay | Admitting: Endocrinology

## 2012-09-17 NOTE — Telephone Encounter (Signed)
LM for pt to call, re: Dr. Everardo All has placed an order for her blood work. She can come to our office to get that done at her convenience. Advised that she will also need a 6 week follow up after the blood work, she can call me back to sch the appt or schedul when she comes in for the labs / Sherri S.

## 2012-09-18 ENCOUNTER — Other Ambulatory Visit: Payer: Self-pay

## 2012-09-18 MED ORDER — ALPRAZOLAM 0.25 MG PO TABS: 1.0000 mg | ORAL_TABLET | Freq: Every evening | ORAL | Status: DC | PRN

## 2012-10-15 ENCOUNTER — Other Ambulatory Visit (INDEPENDENT_AMBULATORY_CARE_PROVIDER_SITE_OTHER): Payer: 59

## 2012-10-15 DIAGNOSIS — E0590 Thyrotoxicosis, unspecified without thyrotoxic crisis or storm: Secondary | ICD-10-CM

## 2012-10-16 LAB — T4, FREE: Free T4: 0.81 ng/dL (ref 0.60–1.60)

## 2012-10-27 ENCOUNTER — Other Ambulatory Visit: Payer: Self-pay | Admitting: Internal Medicine

## 2012-10-28 NOTE — Telephone Encounter (Signed)
Refill done.  

## 2012-10-29 ENCOUNTER — Other Ambulatory Visit: Payer: Self-pay | Admitting: Endocrinology

## 2012-11-01 ENCOUNTER — Other Ambulatory Visit: Payer: Self-pay

## 2012-11-01 MED ORDER — ALPRAZOLAM 0.25 MG PO TABS: 1.0000 mg | ORAL_TABLET | Freq: Every evening | ORAL | Status: DC | PRN

## 2012-11-15 ENCOUNTER — Ambulatory Visit (INDEPENDENT_AMBULATORY_CARE_PROVIDER_SITE_OTHER): Payer: 59 | Admitting: Endocrinology

## 2012-11-15 ENCOUNTER — Encounter: Payer: Self-pay | Admitting: Endocrinology

## 2012-11-15 VITALS — BP 126/80 | HR 90 | Ht 63.0 in | Wt 107.0 lb

## 2012-11-15 DIAGNOSIS — E0590 Thyrotoxicosis, unspecified without thyrotoxic crisis or storm: Secondary | ICD-10-CM

## 2012-11-15 LAB — T4, FREE: Free T4: 0.76 ng/dL (ref 0.60–1.60)

## 2012-11-15 LAB — TSH: TSH: 0.23 u[IU]/mL — ABNORMAL LOW (ref 0.35–5.50)

## 2012-11-15 NOTE — Progress Notes (Signed)
Subjective:    Patient ID: Michaela Hall, female    DOB: [birth year], 57 y.o.   MRN: [id number]  HPI Pt was dx'ed with hyperthyroidism in mid-2013, and was rx'ed with tapazole.  This was d/c'ed in January of 2014, due to improved TFT.  TFT were still normal in February, so tapazole continued to be held.  She reports few mos of increasing severe tremor of the hands, and assoc insomnia.  She also has palpitations in the chest, weight loss, cold intolerance, and excessive diaphoresis.  Xanax does not help sxs.   Past Medical History  Diagnosis Date  . Chronic pain     back and right hip  . Complication of anesthesia   . PONV (postoperative nausea and vomiting)   . Headache(784.0)   . DJD (degenerative joint disease)   . Depression   . Anxiety     Past Surgical History  Procedure Laterality Date  . Back surgery    . Hip surgery    . Tonsillectomy    . Knee surgery    . Esophagogastroduodenoscopy  09/28/2011    Procedure: ESOPHAGOGASTRODUODENOSCOPY (EGD);  Surgeon: Vertell Novak., MD;  Location: [facility];  Service: Endoscopy;  Laterality: N/A;    History   Social History  . Marital Status: Married    Spouse Name: N/A    Number of Children: 3  . Years of Education: N/A   Occupational History  . 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 Smoking status: Current Every Day Smoker -- 0.50 packs/day for 16 years    Types: Cigarettes  . Smokeless tobacco: Never Used  . Alcohol Use: No  . Drug Use: No  . Sexually Active:    Other Topics Concern  . Not on file   Social History Narrative   Last colon screening: 09/2011    Current Outpatient Prescriptions on File Prior to Visit  Medication Sig Dispense Refill  . ALPRAZolam (XANAX) 0.25 MG tablet Take 4 tablets (1 mg total) by mouth at bedtime as needed for anxiety.  30 tablet  1  . aspirin 81 MG tablet Take 81 mg by mouth daily.      Marland Kitchen FLUoxetine (PROZAC) 20 MG tablet TAKE 1&1/2 TABLETS BY  MOUTH EVERY DAY  60 tablet  5  . ondansetron (ZOFRAN) 4 MG tablet Take 4 mg by mouth every 8 (eight) hours as needed. Nausea      . Oxycodone HCl 10 MG TABS Per Dr Ethelene Hal      . tiZANidine (ZANAFLEX) 2 MG tablet Take 2 mg by mouth every 6 (six) hours as needed. Muscle tension      . albuterol (PROVENTIL) (2.5 MG/3ML) 0.083% nebulizer solution Take 2.5 mg by nebulization every 6 (six) hours as needed. Wheezing       No current facility-administered medications on file prior to visit.    No Known Allergies  Family History  Problem Relation Age of Onset  . Anesthesia problems Mother   . Cancer Mother     ovary/uterus & breast  . Hyperlipidemia Mother   . Hypertension Mother   . Breast cancer Maternal Aunt   . Ovarian cancer Maternal Grandmother   .     Marland Kitchen Hyperlipidemia Father   . Hypertension Father     BP 126/80  Pulse 90  Ht 5\' 3"  (1.6 m)  Wt 107 lb (48.535 kg)  BMI 18.96 kg/m2  SpO2 98%  Review of Systems She reports depression  and anxiety.    Objective:   Physical Exam VITAL SIGNS:  See vs page GENERAL: no distress NECK: There is no palpable thyroid enlargement.  No thyroid nodule is palpable.  No palpable lymphadenopathy at the anterior neck. Neuro: severe tremor of the hands PSYCH: Alert and oriented x 3. anxious  Lab Results  Component Value Date   TSH 0.23* 11/15/2012      Assessment & Plan:  Hyperthyroidism, mild Anxiety, not helped by xanax Cold intolerance, not thyroid-related

## 2012-11-15 NOTE — Patient Instructions (Signed)
blood tests are being requested for you today.  We'll contact you with results.  

## 2012-11-18 ENCOUNTER — Other Ambulatory Visit: Payer: Self-pay | Admitting: Endocrinology

## 2012-11-18 ENCOUNTER — Telehealth: Payer: Self-pay | Admitting: Endocrinology

## 2012-11-18 MED ORDER — METHIMAZOLE 5 MG PO TABS: 5.0000 mg | ORAL_TABLET | Freq: Every day | ORAL | Status: DC

## 2012-11-18 NOTE — Telephone Encounter (Signed)
Pt needs 6 week follow up w/ Everardo All. LM for pt to call for appt / Sherri S.

## 2012-12-09 NOTE — Telephone Encounter (Signed)
LM for pt to call. 2nd call. / Sherri

## 2012-12-30 ENCOUNTER — Encounter: Payer: Self-pay | Admitting: Endocrinology

## 2012-12-30 ENCOUNTER — Ambulatory Visit (INDEPENDENT_AMBULATORY_CARE_PROVIDER_SITE_OTHER): Payer: 59 | Admitting: Endocrinology

## 2012-12-30 VITALS — BP 130/70 | HR 78 | Ht 64.0 in | Wt 107.0 lb

## 2012-12-30 DIAGNOSIS — E0590 Thyrotoxicosis, unspecified without thyrotoxic crisis or storm: Secondary | ICD-10-CM

## 2012-12-30 LAB — T4, FREE: Free T4: 0.8 ng/dL (ref 0.60–1.60)

## 2012-12-30 LAB — TSH: TSH: 3.22 u[IU]/mL (ref 0.35–5.50)

## 2012-12-30 NOTE — Progress Notes (Signed)
Subjective:    Patient ID: Michaela Hall, female    DOB: 04/09/1956, 57 y.o.   MRN: [id number]  HPI Pt was dx'ed with hyperthyroidism in mid-2013, and was rx'ed with tapazole.  This was d/c'ed in January of 2014, due to improved TFT.  TFT were still normal in February, so tapazole continued to be held.  She resumed tapazole in June of 2014, due to sxs, and slightly abnormal TSH.  Since then, she feels no better.  Main sxs are still anxiety, fatigue, and tremor.  She says she stays in bed most of the time.   Past Medical History  Diagnosis Date  . Chronic pain     back and right hip  . Complication of anesthesia   . PONV (postoperative nausea and vomiting)   . Headache(784.0)   . DJD (degenerative joint disease)   . Depression   . Anxiety     Past Surgical History  Procedure Laterality Date  . Back surgery    . Hip surgery    . Tonsillectomy    . Knee surgery    . Esophagogastroduodenoscopy  09/28/2011    Procedure: ESOPHAGOGASTRODUODENOSCOPY (EGD);  Surgeon: Vertell Novak., MD;  Location: [facility];  Service: Endoscopy;  Laterality: N/A;    History   Social History  . Marital Status: Married    Spouse Name: N/A    Number of Children: 3  . Years of Education: N/A   Occupational History  . 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 Smoking status: Current Every Day Smoker -- 0.50 packs/day for 16 years    Types: Cigarettes  . Smokeless tobacco: Never Used  . Alcohol Use: No  . Drug Use: No  . Sexually Active:    Other Topics Concern  . Not on file   Social History Narrative   Last colon screening: 09/2011    Current Outpatient Prescriptions on File Prior to Visit  Medication Sig Dispense Refill  . ALPRAZolam (XANAX) 0.25 MG tablet Take 4 tablets (1 mg total) by mouth at bedtime as needed for anxiety.  30 tablet  1  . aspirin 81 MG tablet Take 81 mg by mouth daily.      Marland Kitchen FLUoxetine (PROZAC) 20 MG tablet TAKE 1&1/2 TABLETS BY  MOUTH EVERY DAY  60 tablet  5  . methimazole (TAPAZOLE) 5 MG tablet Take 1 tablet (5 mg total) by mouth daily.  30 tablet  2  . ondansetron (ZOFRAN) 4 MG tablet Take 4 mg by mouth every 8 (eight) hours as needed. Nausea      . Oxycodone HCl 10 MG TABS Per Dr Ethelene Hal      . tiZANidine (ZANAFLEX) 2 MG tablet Take 2 mg by mouth every 6 (six) hours as needed. Muscle tension      . albuterol (PROVENTIL) (2.5 MG/3ML) 0.083% nebulizer solution Take 2.5 mg by nebulization every 6 (six) hours as needed. Wheezing       No current facility-administered medications on file prior to visit.    No Known Allergies  Family History  Problem Relation Age of Onset  . Anesthesia problems Mother   . Cancer Mother     ovary/uterus & breast  . Hyperlipidemia Mother   . Hypertension Mother   . Breast cancer Maternal Aunt   . Ovarian cancer Maternal Grandmother   .     Marland Kitchen Hyperlipidemia Father   . Hypertension Father     BP 130/70  Pulse  78  Ht 5\' 4"  (1.626 m)  Wt 107 lb (48.535 kg)  BMI 18.36 kg/m2  SpO2 98%  Review of Systems Denies fever.      Objective:   Physical Exam VITAL SIGNS:  See vs page GENERAL: no distress NECK: There is no palpable thyroid enlargement.  No thyroid nodule is palpable.  No palpable lymphadenopathy at the anterior neck. Neuro: slight tremor of the hands.  Lab Results  Component Value Date   TSH 3.22 12/30/2012      Assessment & Plan:  Hyperthyroidism: has not recurred since tapazole was stopped.   Sxs, not thyroid-related

## 2013-01-04 ENCOUNTER — Emergency Department (HOSPITAL_COMMUNITY)
Admission: EM | Admit: 2013-01-04 | Discharge: 2013-01-04 | Disposition: A | Discharge status: Home / Self Care | Payer: 59 | Attending: Emergency Medicine | Admitting: Emergency Medicine

## 2013-01-04 ENCOUNTER — Encounter (HOSPITAL_COMMUNITY): Payer: Self-pay | Admitting: *Deleted

## 2013-01-04 DIAGNOSIS — F3289 Other specified depressive episodes: Secondary | ICD-10-CM | POA: Insufficient documentation

## 2013-01-04 DIAGNOSIS — Z8739 Personal history of other diseases of the musculoskeletal system and connective tissue: Secondary | ICD-10-CM | POA: Insufficient documentation

## 2013-01-04 DIAGNOSIS — Z9889 Other specified postprocedural states: Secondary | ICD-10-CM | POA: Insufficient documentation

## 2013-01-04 DIAGNOSIS — G43909 Migraine, unspecified, not intractable, without status migrainosus: Secondary | ICD-10-CM

## 2013-01-04 DIAGNOSIS — F411 Generalized anxiety disorder: Secondary | ICD-10-CM | POA: Insufficient documentation

## 2013-01-04 DIAGNOSIS — H53149 Visual discomfort, unspecified: Secondary | ICD-10-CM | POA: Insufficient documentation

## 2013-01-04 DIAGNOSIS — R51 Headache: Secondary | ICD-10-CM | POA: Insufficient documentation

## 2013-01-04 DIAGNOSIS — Z7982 Long term (current) use of aspirin: Secondary | ICD-10-CM | POA: Insufficient documentation

## 2013-01-04 DIAGNOSIS — R112 Nausea with vomiting, unspecified: Secondary | ICD-10-CM | POA: Insufficient documentation

## 2013-01-04 DIAGNOSIS — Z79899 Other long term (current) drug therapy: Secondary | ICD-10-CM | POA: Insufficient documentation

## 2013-01-04 DIAGNOSIS — Z87891 Personal history of nicotine dependence: Secondary | ICD-10-CM | POA: Insufficient documentation

## 2013-01-04 DIAGNOSIS — F17200 Nicotine dependence, unspecified, uncomplicated: Secondary | ICD-10-CM | POA: Insufficient documentation

## 2013-01-04 DIAGNOSIS — F329 Major depressive disorder, single episode, unspecified: Secondary | ICD-10-CM | POA: Insufficient documentation

## 2013-01-04 DIAGNOSIS — G8929 Other chronic pain: Secondary | ICD-10-CM | POA: Insufficient documentation

## 2013-01-04 MED ORDER — ONDANSETRON 8 MG PO TBDP: 8.0000 mg | ORAL_TABLET | Freq: Three times a day (TID) | ORAL | Status: DC | PRN

## 2013-01-04 MED ORDER — ONDANSETRON HCL 4 MG/2ML IJ SOLN
4.0000 mg | Freq: Once | INTRAMUSCULAR | Status: AC
  Administered 2013-01-04: 4 mg via INTRAVENOUS
  Filled 2013-01-04: qty 2

## 2013-01-04 MED ORDER — METOCLOPRAMIDE HCL 5 MG/ML IJ SOLN
10.0000 mg | Freq: Once | INTRAMUSCULAR | Status: AC
  Administered 2013-01-04: 10 mg via INTRAVENOUS
  Filled 2013-01-04: qty 2

## 2013-01-04 MED ORDER — KETOROLAC TROMETHAMINE 30 MG/ML IJ SOLN
30.0000 mg | Freq: Once | INTRAMUSCULAR | Status: AC
  Administered 2013-01-04: 30 mg via INTRAVENOUS
  Filled 2013-01-04: qty 1

## 2013-01-04 MED ORDER — HYDROMORPHONE HCL PF 1 MG/ML IJ SOLN
1.0000 mg | Freq: Once | INTRAMUSCULAR | Status: AC
  Administered 2013-01-04: 1 mg via INTRAVENOUS
  Filled 2013-01-04: qty 1

## 2013-01-04 MED ORDER — SODIUM CHLORIDE 0.9 % IV BOLUS (SEPSIS)
1000.0000 mL | Freq: Once | INTRAVENOUS | Status: AC
  Administered 2013-01-04: 1000 mL via INTRAVENOUS

## 2013-01-04 NOTE — Discharge Instructions (Signed)
Migraine Headache A migraine headache is an intense, throbbing pain on one or both sides of your head. A migraine can last for 30 minutes to several hours. CAUSES  The exact cause of a migraine headache is not always known. However, a migraine may be caused when nerves in the brain become irritated and release chemicals that cause inflammation. This causes pain. SYMPTOMS  Pain on one or both sides of your head.  Pulsating or throbbing pain.  Severe pain that prevents daily activities.  Pain that is aggravated by any physical activity.  Nausea, vomiting, or both.  Dizziness.  Pain with exposure to bright lights, loud noises, or activity.  General sensitivity to bright lights, loud noises, or smells. Before you get a migraine, you may get warning signs that a migraine is coming (aura). An aura may include:  Seeing flashing lights.  Seeing bright spots, halos, or zig-zag lines.  Having tunnel vision or blurred vision.  Having feelings of numbness or tingling.  Having trouble talking.  Having muscle weakness. MIGRAINE TRIGGERS  Alcohol.  Smoking.  Stress.  Menstruation.  Aged cheeses.  Foods or drinks that contain nitrates, glutamate, aspartame, or tyramine.  Lack of sleep.  Chocolate.  Caffeine.  Hunger.  Physical exertion.  Fatigue.  Medicines used to treat chest pain (nitroglycerine), birth control pills, estrogen, and some blood pressure medicines. DIAGNOSIS  A migraine headache is often diagnosed based on:  Symptoms.  Physical examination.  A CT scan or MRI of your head. TREATMENT Medicines may be given for pain and nausea. Medicines can also be given to help prevent recurrent migraines.  HOME CARE INSTRUCTIONS  Only take over-the-counter or prescription medicines for pain or discomfort as directed by your caregiver. The use of long-term narcotics is not recommended.  Lie down in a dark, quiet room when you have a migraine.  Keep a journal  to find out what may trigger your migraine headaches. For example, write down:  What you eat and drink.  How much sleep you get.  Any change to your diet or medicines.  Limit alcohol consumption.  Quit smoking if you smoke.  Get 7 to 9 hours of sleep, or as recommended by your caregiver.  Limit stress.  Keep lights dim if bright lights bother you and make your migraines worse. SEEK IMMEDIATE MEDICAL CARE IF:   Your migraine becomes severe.  You have a fever.  You have a stiff neck.  You have vision loss.  You have muscular weakness or loss of muscle control.  You start losing your balance or have trouble walking.  You feel faint or pass out.  You have severe symptoms that are different from your first symptoms. MAKE SURE YOU:   Understand these instructions.  Will watch your condition.  Will get help right away if you are not doing well or get worse. Document Released: 05/29/2005 Document Revised: 08/21/2011 Document Reviewed: 05/19/2011 [employer] Patient Information 2014 [employer].  

## 2013-01-04 NOTE — ED Notes (Signed)
[id number]<AV> Expected date:01/04/13<BR> Expected time:11:58 AM<BR> Means of arrival:Ambulance<BR> Comments:<BR> Migraine

## 2013-01-04 NOTE — ED Notes (Signed)
Per EMS pt with hx of migraines, woke up this am with headache, n/v/, photophobia. Pt given 4 mg zofran IV en route; pt is crying and moaning, only answer questions by nodding her head.

## 2013-01-04 NOTE — ED Provider Notes (Signed)
CSN: [id number]     Arrival date & time 01/04/13  1210 History     First MD Initiated Contact with Patient 01/04/13 1211     Chief Complaint  Patient presents with  . Migraine    HPI Patient presents with worsening headache since this morning.  She has nausea and vomiting associated photophobia.  She has a long-standing history of migraines and states this feels similar to her prior migraine headaches.  She's had nausea to the point where she can keep her chronic pain medicine home today.  She reports no change in her vision.  No chest pain or abdominal pain.  No diarrhea.  No recent illness or fevers or chills.  No throat pain or ear pain.  Her symptoms are moderate to severe in severity.  Her pain is worsened by light and loud noises.  Nothing improves her pain   Past Medical History  Diagnosis Date  . Chronic pain     back and right hip  . Complication of anesthesia   . PONV (postoperative nausea and vomiting)   . Headache(784.0)   . DJD (degenerative joint disease)   . Depression   . Anxiety    Past Surgical History  Procedure Laterality Date  . Back surgery    . Hip surgery    . Tonsillectomy    . Knee surgery    . Esophagogastroduodenoscopy  09/28/2011    Procedure: ESOPHAGOGASTRODUODENOSCOPY (EGD);  Surgeon: Vertell Novak., MD;  Location: [facility];  Service: Endoscopy;  Laterality: N/A;   Family History  Problem Relation Age of Onset  . Anesthesia problems Mother   . Cancer Mother     ovary/uterus & breast  . Hyperlipidemia Mother   . Hypertension Mother   . Breast cancer Maternal Aunt   . Ovarian cancer Maternal Grandmother   .     Marland Kitchen Hyperlipidemia Father   . Hypertension Father    History  Substance Use Topics  . Smoking status: Current Every Day Smoker -- 0.50 packs/day for 16 years    Types: Cigarettes  . Smokeless tobacco: Never Used  . Alcohol Use: No   OB History   Grav Para Term Preterm Abortions TAB SAB Ect Mult Living                  Review of Systems  All other systems reviewed and are negative.    Allergies  Review of patient's allergies indicates no known allergies.  Home Medications   Current Outpatient Rx  Name  Route  Sig  Dispense  Refill  . ALPRAZolam (XANAX) 0.25 MG tablet   Oral   Take 4 tablets (1 mg total) by mouth at bedtime as needed for anxiety.   30 tablet   1   . aspirin 81 MG tablet   Oral   Take 81 mg by mouth daily.         Marland Kitchen FLUoxetine (PROZAC) 20 MG tablet      TAKE 1&1/2 TABLETS BY MOUTH EVERY DAY   60 tablet   5   . methimazole (TAPAZOLE) 5 MG tablet   Oral   Take 1 tablet (5 mg total) by mouth daily.   30 tablet   2   . ondansetron (ZOFRAN) 4 MG tablet   Oral   Take 4 mg by mouth every 8 (eight) hours as needed. Nausea         . Oxycodone HCl 10 MG TABS      Per  Dr Ethelene Hal         . tiZANidine (ZANAFLEX) 2 MG tablet   Oral   Take 2 mg by mouth every 6 (six) hours as needed. Muscle tension         . EXPIRED: albuterol (PROVENTIL) (2.5 MG/3ML) 0.083% nebulizer solution   Nebulization   Take 2.5 mg by nebulization every 6 (six) hours as needed. Wheezing          BP 131/73  Pulse 72  Temp(Src) 98.1 F (36.7 C) (Oral)  Resp 20  SpO2 100% Physical Exam  Nursing note and vitals reviewed. Constitutional: She is oriented to person, place, and time. She appears well-developed and well-nourished. No distress.  HENT:  Head: Normocephalic and atraumatic.  Eyes: EOM are normal. Pupils are equal, round, and reactive to light.  Neck: Normal range of motion.  Cardiovascular: Normal rate, regular rhythm and normal heart sounds.   Pulmonary/Chest: Effort normal and breath sounds normal.  Abdominal: Soft. She exhibits no distension. There is no tenderness.  Musculoskeletal: Normal range of motion.  Neurological: She is alert and oriented to person, place, and time.  5/5 strength in major muscle groups of  bilateral upper and lower extremities. Speech normal. No  facial asymetry.   Skin: Skin is warm and dry.  Psychiatric: She has a normal mood and affect. Judgment normal.    ED Course   Procedures (including critical care time)  Labs Reviewed - No data to display No results found. No diagnosis found.  MDM  Typical migraine headache for the pt. Non focal neuro exam. No recent head trauma. No fever. Doubt meningitis. Doubt intracranial bleed. Doubt normal pressure hydrocephalus. No indication for imaging. Will treat with migraine cocktail and reevaluate  1:42 PM Feels much better at this time.  Discharge home in good condition.  Lyanne Co, MD 01/04/13 1343

## 2013-01-13 ENCOUNTER — Ambulatory Visit: Payer: 59 | Admitting: Internal Medicine

## 2013-01-13 DIAGNOSIS — Z0289 Encounter for other administrative examinations: Secondary | ICD-10-CM

## 2013-01-14 ENCOUNTER — Other Ambulatory Visit: Payer: Self-pay | Admitting: Internal Medicine

## 2013-01-14 NOTE — Telephone Encounter (Signed)
Spoke with pt. Pharmacy. Pt has refills left on file. Request sent in error.

## 2013-02-12 ENCOUNTER — Other Ambulatory Visit: Payer: Self-pay | Admitting: Endocrinology

## 2013-02-19 ENCOUNTER — Other Ambulatory Visit: Payer: Self-pay | Admitting: Endocrinology

## 2013-03-19 ENCOUNTER — Other Ambulatory Visit: Payer: Self-pay | Admitting: Endocrinology

## 2013-06-27 ENCOUNTER — Other Ambulatory Visit (HOSPITAL_COMMUNITY): Payer: Self-pay | Admitting: Gastroenterology

## 2013-06-27 DIAGNOSIS — R112 Nausea with vomiting, unspecified: Secondary | ICD-10-CM

## 2013-07-03 ENCOUNTER — Emergency Department (HOSPITAL_BASED_OUTPATIENT_CLINIC_OR_DEPARTMENT_OTHER)
Admission: EM | Admit: 2013-07-03 | Discharge: 2013-07-03 | Disposition: A | Discharge status: Home / Self Care | Payer: 59 | Attending: Emergency Medicine | Admitting: Emergency Medicine

## 2013-07-03 ENCOUNTER — Encounter (HOSPITAL_BASED_OUTPATIENT_CLINIC_OR_DEPARTMENT_OTHER): Payer: Self-pay | Admitting: Emergency Medicine

## 2013-07-03 DIAGNOSIS — F3289 Other specified depressive episodes: Secondary | ICD-10-CM | POA: Insufficient documentation

## 2013-07-03 DIAGNOSIS — Z87891 Personal history of nicotine dependence: Secondary | ICD-10-CM | POA: Insufficient documentation

## 2013-07-03 DIAGNOSIS — Z7982 Long term (current) use of aspirin: Secondary | ICD-10-CM | POA: Insufficient documentation

## 2013-07-03 DIAGNOSIS — Z9889 Other specified postprocedural states: Secondary | ICD-10-CM | POA: Insufficient documentation

## 2013-07-03 DIAGNOSIS — R5381 Other malaise: Secondary | ICD-10-CM | POA: Insufficient documentation

## 2013-07-03 DIAGNOSIS — R1084 Generalized abdominal pain: Secondary | ICD-10-CM | POA: Insufficient documentation

## 2013-07-03 DIAGNOSIS — G8929 Other chronic pain: Secondary | ICD-10-CM | POA: Insufficient documentation

## 2013-07-03 DIAGNOSIS — Z79899 Other long term (current) drug therapy: Secondary | ICD-10-CM | POA: Insufficient documentation

## 2013-07-03 DIAGNOSIS — IMO0002 Reserved for concepts with insufficient information to code with codable children: Secondary | ICD-10-CM | POA: Insufficient documentation

## 2013-07-03 DIAGNOSIS — R5383 Other fatigue: Secondary | ICD-10-CM | POA: Insufficient documentation

## 2013-07-03 DIAGNOSIS — F329 Major depressive disorder, single episode, unspecified: Secondary | ICD-10-CM | POA: Insufficient documentation

## 2013-07-03 DIAGNOSIS — R112 Nausea with vomiting, unspecified: Secondary | ICD-10-CM

## 2013-07-03 DIAGNOSIS — R51 Headache: Secondary | ICD-10-CM | POA: Insufficient documentation

## 2013-07-03 DIAGNOSIS — E860 Dehydration: Secondary | ICD-10-CM

## 2013-07-03 DIAGNOSIS — F411 Generalized anxiety disorder: Secondary | ICD-10-CM | POA: Insufficient documentation

## 2013-07-03 LAB — COMPREHENSIVE METABOLIC PANEL
ALBUMIN: 3.9 g/dL (ref 3.5–5.2)
ALT: 7 U/L (ref 0–35)
AST: 15 U/L (ref 0–37)
Alkaline Phosphatase: 72 U/L (ref 39–117)
BUN: 20 mg/dL (ref 6–23)
CO2: 30 mEq/L (ref 19–32)
CREATININE: 0.9 mg/dL (ref 0.50–1.10)
Calcium: 9.8 mg/dL (ref 8.4–10.5)
Chloride: 100 mEq/L (ref 96–112)
GFR calc Af Amer: 81 mL/min — ABNORMAL LOW (ref >90)
GFR calc non Af Amer: 70 mL/min — ABNORMAL LOW (ref >90)
Glucose, Bld: 110 mg/dL — ABNORMAL HIGH (ref 70–99)
Potassium: 4.7 mEq/L (ref 3.7–5.3)
Sodium: 142 mEq/L (ref 137–147)
TOTAL PROTEIN: 6.8 g/dL (ref 6.0–8.3)
Total Bilirubin: 0.3 mg/dL (ref 0.3–1.2)

## 2013-07-03 LAB — CBC WITH DIFFERENTIAL/PLATELET
BASOS PCT: 1 % (ref 0–1)
Basophils Absolute: 0 10*3/uL (ref 0.0–0.1)
EOS ABS: 0.1 10*3/uL (ref 0.0–0.7)
Eosinophils Relative: 1 % (ref 0–5)
HEMATOCRIT: 46.9 % — AB (ref 36.0–46.0)
Hemoglobin: 15.1 g/dL — ABNORMAL HIGH (ref 12.0–15.0)
Lymphocytes Relative: 26 % (ref 12–46)
Lymphs Abs: 1.6 10*3/uL (ref 0.7–4.0)
MCH: 29.9 pg (ref 26.0–34.0)
MCHC: 32.2 g/dL (ref 30.0–36.0)
MCV: 92.9 fL (ref 78.0–100.0)
MONOS PCT: 8 % (ref 3–12)
Monocytes Absolute: 0.5 10*3/uL (ref 0.1–1.0)
Neutro Abs: 3.8 10*3/uL (ref 1.7–7.7)
Neutrophils Relative %: 64 % (ref 43–77)
Platelets: 199 10*3/uL (ref 150–400)
RBC: 5.05 MIL/uL (ref 3.87–5.11)
RDW: 12.7 % (ref 11.5–15.5)
WBC: 6 10*3/uL (ref 4.0–10.5)

## 2013-07-03 LAB — URINALYSIS, ROUTINE W REFLEX MICROSCOPIC
BILIRUBIN URINE: NEGATIVE
GLUCOSE, UA: NEGATIVE mg/dL
Hgb urine dipstick: NEGATIVE
KETONES UR: 15 mg/dL — AB
LEUKOCYTES UA: NEGATIVE
Nitrite: NEGATIVE
Protein, ur: NEGATIVE mg/dL
Specific Gravity, Urine: 1.022 (ref 1.005–1.030)
Urobilinogen, UA: 1 mg/dL (ref 0.0–1.0)
pH: 8 (ref 5.0–8.0)

## 2013-07-03 LAB — LIPASE, BLOOD: LIPASE: 19 U/L (ref 11–59)

## 2013-07-03 MED ORDER — PROMETHAZINE HCL 25 MG/ML IJ SOLN
25.0000 mg | Freq: Once | INTRAMUSCULAR | Status: AC
  Administered 2013-07-03: 25 mg via INTRAVENOUS
  Filled 2013-07-03: qty 1

## 2013-07-03 MED ORDER — SODIUM CHLORIDE 0.9 % IV BOLUS (SEPSIS)
2000.0000 mL | Freq: Once | INTRAVENOUS | Status: AC
  Administered 2013-07-03: 1000 mL via INTRAVENOUS

## 2013-07-03 MED ORDER — KETOROLAC TROMETHAMINE 30 MG/ML IJ SOLN
10.0000 mg | Freq: Once | INTRAMUSCULAR | Status: AC
  Administered 2013-07-03: 30 mg via INTRAVENOUS
  Filled 2013-07-03: qty 1

## 2013-07-03 MED ORDER — LORAZEPAM 2 MG/ML IJ SOLN
1.0000 mg | Freq: Once | INTRAMUSCULAR | Status: AC
  Administered 2013-07-03: 1 mg via INTRAVENOUS
  Filled 2013-07-03: qty 1

## 2013-07-03 NOTE — ED Provider Notes (Addendum)
CSN: [id number]     Arrival date & time 07/03/13  1905 History  This chart was scribed for Blanchie Dessert, MD by Zettie Pho, ED Scribe. This patient was seen in room MH08/MH08 and the patient's care was started at 8:11 PM.    Chief Complaint  Patient presents with  . Emesis   The history is provided by the patient. No language interpreter was used.   HPI Comments: Michaela Hall is a 58 y.o. Female who presents to the Emergency Department complaining of persistent nausea and emesis (approximately 3 episodes daily, last episode about 7 hours ago) with associated diffuse abdominal pain and fatigue onset about a week ago. Patient states that she has not been able to tolerate solids and is only able to minimally tolerate liquids. She reports taking Zofran at home without significant relief of her symptoms. She reports an associated diffuse headache that she states feel similar to her past chronic headaches. Patient states that she is followed by Dr. Cristina Gong and is scheduled to have a "gastric emptying test" on 07/08/2013 (5 days from now). She denies fever, diarrhea. She denies history of abdominal surgeries. Patient has a history of chronic pain secondary to degenerative joint disease, depression, and anxiety.   Past Medical History  Diagnosis Date  . Chronic pain     back and right hip  . Complication of anesthesia   . PONV (postoperative nausea and vomiting)   . Headache(784.0)   . DJD (degenerative joint disease)   . Depression   . Anxiety    Past Surgical History  Procedure Laterality Date  . Back surgery    . Hip surgery    . Tonsillectomy    . Knee surgery    . Esophagogastroduodenoscopy  09/28/2011    Procedure: ESOPHAGOGASTRODUODENOSCOPY (EGD);  Surgeon: Winfield Cunas., MD;  Location: [facility];  Service: Endoscopy;  Laterality: N/A;   Family History  Problem Relation Age of Onset  . Anesthesia problems Mother   . Cancer Mother     ovary/uterus & breast  .  Hyperlipidemia Mother   . Hypertension Mother   . Breast cancer Maternal Aunt   . Ovarian cancer Maternal Grandmother   .     Marland Kitchen Hyperlipidemia Father   . Hypertension Father    History  Substance Use Topics  . Smoking status: Former Smoker -- 0.50 packs/day for 16 years  . Smokeless tobacco: Never Used  . Alcohol Use: No   OB History   Grav Para Term Preterm Abortions TAB SAB Ect Mult Living                 Review of Systems  A complete 10 system review of systems was obtained and all systems are negative except as noted in the HPI and PMH.   Allergies  Review of patient's allergies indicates no known allergies.  Home Medications   Current Outpatient Rx  Name  Route  Sig  Dispense  Refill  . GABAPENTIN, PHN, PO   Oral   Take by mouth.         . SERTRALINE HCL PO   Oral   Take by mouth.         . EXPIRED: albuterol (PROVENTIL) (2.5 MG/3ML) 0.083% nebulizer solution   Nebulization   Take 2.5 mg by nebulization every 6 (six) hours as needed. Wheezing         . ALPRAZolam (XANAX) 0.25 MG tablet   Oral   Take 4 tablets (1  mg total) by mouth at bedtime as needed for anxiety.   30 tablet   1   . aspirin 81 MG tablet   Oral   Take 81 mg by mouth daily.         Marland Kitchen FLUoxetine (PROZAC) 20 MG tablet      TAKE 1&1/2 TABLETS BY MOUTH EVERY DAY   60 tablet   5   . methimazole (TAPAZOLE) 5 MG tablet      TAKE 1 TABLET BY MOUTH DAILY.   30 tablet   2   . ondansetron (ZOFRAN ODT) 8 MG disintegrating tablet   Oral   Take 1 tablet (8 mg total) by mouth every 8 (eight) hours as needed for nausea.   10 tablet   0   . ondansetron (ZOFRAN) 4 MG tablet   Oral   Take 4 mg by mouth every 8 (eight) hours as needed. Nausea         . Oxycodone HCl 10 MG TABS      Per Dr Nelva Bush         . tiZANidine (ZANAFLEX) 2 MG tablet   Oral   Take 2 mg by mouth every 6 (six) hours as needed. Muscle tension          Triage Vitals: BP 102/69  Pulse 86  Temp(Src)  98.4 F (36.9 C) (Oral)  Resp 18  Ht 5\' 3"  (1.6 m)  Wt 109 lb (49.442 kg)  BMI 19.31 kg/m2  SpO2 100%  Physical Exam  Nursing note and vitals reviewed. Constitutional: She is oriented to person, place, and time. She appears well-developed and well-nourished. No distress.  HENT:  Head: Normocephalic and atraumatic.  Dry mucus membranes.   Eyes: Conjunctivae are normal.  Neck: Normal range of motion. Neck supple.  Cardiovascular: Normal rate, regular rhythm and normal heart sounds.   Pulmonary/Chest: Effort normal and breath sounds normal. No respiratory distress.  Abdominal: Soft. Bowel sounds are normal. She exhibits no distension. There is no tenderness. There is no rebound and no guarding.  Musculoskeletal: Normal range of motion.  Neurological: She is alert and oriented to person, place, and time.  Skin: Skin is warm and dry.  Poor skin turgor.   Psychiatric: She has a normal mood and affect. Her behavior is normal.    ED Course  Procedures (including critical care time)  DIAGNOSTIC STUDIES: Oxygen Saturation is 100% on room air, normal by my interpretation.    COORDINATION OF CARE: 8:17 PM- Will order blood labs and UA. Will order IV fluids and Ativan to manage symptoms. Will order Toradol at the patient's request, which she states has been effective at treating her headaches in the past. Discussed treatment plan with patient at bedside and patient verbalized agreement.     Labs Review Labs Reviewed  URINALYSIS, ROUTINE W REFLEX MICROSCOPIC - Abnormal; Notable for the following:    Ketones, ur 15 (*)    All other components within normal limits  CBC WITH DIFFERENTIAL - Abnormal; Notable for the following:    Hemoglobin 15.1 (*)    HCT 46.9 (*)    All other components within normal limits  COMPREHENSIVE METABOLIC PANEL - Abnormal; Notable for the following:    Glucose, Bld 110 (*)    GFR calc non Af Amer 70 (*)    GFR calc Af Amer 81 (*)    All other components  within normal limits  LIPASE, BLOOD   Imaging Review No results found.  EKG Interpretation  None       MDM   1. Nausea and vomiting   2. Dehydration     Patient with chronic nausea and vomiting he is currently getting a workup by gastroenterology presents because this week she's had worsening vomiting and unable to hold anything down other than small sips of ginger ale. All week she's only had 2 boosts and peanutbutter sandwich. Abdominal tenderness on exam concerning for an acute abdomen. Normal bowel sounds and patient is passing stool with low concern for obstruction. No parenchymal surgeries. No symptoms concerning for pancreatitis, hepatitis, cholecystitis, appendicitis or diverticulitis at this time. Patient appears dehydrated and feel would benefit from IV fluids. She was also given Toradol per request for headache she's ever had issues with gastritis or GERD feel that is reasonable and also she was given Ativan for nausea as the Zofran is not helping and because she will be undergoing gastric emptying studies soon will avoid Reglan.  9:56 PM Labs insiginficant except for mild ketones and hemoconcentration.  Still nauseated but given phenergan.  10:40 PM Pt feeling better and ready to go home.  I personally performed the services described in this documentation, which was scribed in my presence.  The recorded information has been reviewed and considered.     Blanchie Dessert, MD 07/03/13 1856  Blanchie Dessert, MD 07/03/13 2240

## 2013-07-03 NOTE — ED Notes (Signed)
Pt states she is being followed by Dr Cristina Gong and is scheduled to have "gastric emptying test" to be done 1/27-here today for vomiting x 1 week

## 2013-07-03 NOTE — ED Notes (Signed)
MD at bedside. 

## 2013-07-03 NOTE — Discharge Instructions (Signed)
Dehydration, Adult  Dehydration means your body does not have as much fluid as it needs. Your kidneys, brain, and heart will not work properly without the right amount of fluids and salt.   HOME CARE   Ask your doctor how to replace body fluid losses (rehydrate).   Drink enough fluids to keep your pee (urine) clear or pale yellow.   Drink small amounts of fluids often if you feel sick to your stomach (nauseous) or throw up (vomit).   Eat like you normally do.   Avoid:   Foods or drinks high in sugar.   Bubbly (carbonated) drinks.   Juice.   Very hot or cold fluids.   Drinks with caffeine.   Fatty, greasy foods.   Alcohol.   Tobacco.   Eating too much.   Gelatin desserts.   Wash your hands to avoid spreading germs (bacteria, viruses).   Only take medicine as told by your doctor.   Keep all doctor visits as told.  GET HELP RIGHT AWAY IF:    You cannot drink something without throwing up.   You get worse even with treatment.   Your vomit has blood in it or looks greenish.   Your poop (stool) has blood in it or looks black and tarry.   You have not peed in 6 to 8 hours.   You pee a small amount of very dark pee.   You have a fever.   You pass out (faint).   You have belly (abdominal) pain that gets worse or stays in one spot (localizes).   You have a rash, stiff neck, or bad headache.   You get easily annoyed, sleepy, or are hard to wake up.   You feel weak, dizzy, or very thirsty.  MAKE SURE YOU:    Understand these instructions.   Will watch your condition.   Will get help right away if you are not doing well or get worse.  Document Released: 03/25/2009 Document Revised: 08/21/2011 Document Reviewed: 01/16/2011  [employer] Patient Information 2014 [employer].

## 2013-07-08 ENCOUNTER — Ambulatory Visit (HOSPITAL_COMMUNITY)
Admission: RE | Admit: 2013-07-08 | Discharge: 2013-07-08 | Disposition: A | Discharge status: Home / Self Care | Payer: 59 | Source: Ambulatory Visit | Attending: Gastroenterology | Admitting: Gastroenterology

## 2013-07-08 DIAGNOSIS — R112 Nausea with vomiting, unspecified: Secondary | ICD-10-CM | POA: Insufficient documentation

## 2013-07-08 DIAGNOSIS — K3189 Other diseases of stomach and duodenum: Secondary | ICD-10-CM | POA: Insufficient documentation

## 2013-07-08 DIAGNOSIS — R1013 Epigastric pain: Principal | ICD-10-CM

## 2013-07-08 MED ORDER — TECHNETIUM TC 99M SULFUR COLLOID
2.0000 | Freq: Once | INTRAVENOUS | Status: AC | PRN
Start: 2013-07-08 — End: 2013-07-08
  Administered 2013-07-08: 2 via ORAL

## 2013-08-12 ENCOUNTER — Ambulatory Visit (INDEPENDENT_AMBULATORY_CARE_PROVIDER_SITE_OTHER): Payer: 59 | Admitting: Family Medicine

## 2013-08-12 VITALS — BP 110/78 | HR 90 | Temp 97.7°F | Resp 18 | Ht 63.0 in | Wt 110.6 lb

## 2013-08-12 DIAGNOSIS — R8281 Pyuria: Secondary | ICD-10-CM

## 2013-08-12 DIAGNOSIS — R82998 Other abnormal findings in urine: Secondary | ICD-10-CM

## 2013-08-12 DIAGNOSIS — R1013 Epigastric pain: Secondary | ICD-10-CM

## 2013-08-12 DIAGNOSIS — K3189 Other diseases of stomach and duodenum: Secondary | ICD-10-CM

## 2013-08-12 DIAGNOSIS — K30 Functional dyspepsia: Secondary | ICD-10-CM

## 2013-08-12 DIAGNOSIS — R109 Unspecified abdominal pain: Secondary | ICD-10-CM

## 2013-08-12 LAB — POCT CBC
Granulocyte percent: 76.5 %G (ref 37–80)
HCT, POC: 46.4 % (ref 37.7–47.9)
Hemoglobin: 14.7 g/dL (ref 12.2–16.2)
Lymph, poc: 1.5 (ref 0.6–3.4)
MCH, POC: 29.6 pg (ref 27–31.2)
MCHC: 31.7 g/dL — AB (ref 31.8–35.4)
MCV: 93.6 fL (ref 80–97)
MID (cbc): 0.3 (ref 0–0.9)
MPV: 12.2 fL (ref 0–99.8)
POC Granulocyte: 5.9 (ref 2–6.9)
POC LYMPH PERCENT: 19 %L (ref 10–50)
POC MID %: 4.5 %M (ref 0–12)
Platelet Count, POC: 206 10*3/uL (ref 142–424)
RBC: 4.96 M/uL (ref 4.04–5.48)
RDW, POC: 14.4 %
WBC: 7.7 10*3/uL (ref 4.6–10.2)

## 2013-08-12 LAB — POCT UA - MICROSCOPIC ONLY
Casts, Ur, LPF, POC: NEGATIVE
Crystals, Ur, HPF, POC: NEGATIVE
Mucus, UA: POSITIVE
Yeast, UA: NEGATIVE

## 2013-08-12 LAB — POCT URINALYSIS DIPSTICK
Bilirubin, UA: NEGATIVE
Blood, UA: NEGATIVE
Glucose, UA: NEGATIVE
Ketones, UA: NEGATIVE
Nitrite, UA: NEGATIVE
Protein, UA: NEGATIVE
Spec Grav, UA: 1.02
Urobilinogen, UA: 0.2
pH, UA: 7

## 2013-08-12 MED ORDER — CIPROFLOXACIN HCL 500 MG PO TABS: 500.0000 mg | ORAL_TABLET | Freq: Two times a day (BID) | ORAL | Status: DC

## 2013-08-12 MED ORDER — NALBUPHINE HCL 10 MG/ML IJ SOLN: 10.0000 mg | INTRAMUSCULAR | Status: DC | PRN

## 2013-08-12 MED ORDER — METOCLOPRAMIDE HCL 5 MG PO TABS: 5.0000 mg | ORAL_TABLET | Freq: Four times a day (QID) | ORAL | Status: DC

## 2013-08-12 MED ORDER — NALBUPHINE HCL 10 MG/ML IJ SOLN
10.0000 mg | INTRAMUSCULAR | Status: DC | PRN
  Administered 2013-08-12: 10 mg via INTRAMUSCULAR

## 2013-08-12 NOTE — Patient Instructions (Signed)
Please return in 48 hours for recheck of urinary infection.  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 I recommend Miralax daily for prevention of constipation when taking narcotics.     Pyelonephritis, Adult Pyelonephritis is a kidney infection. In general, there are 2 main types of pyelonephritis:  Infections that come on quickly without any warning (acute pyelonephritis).  Infections that persist for a long period of time (chronic pyelonephritis). CAUSES  Two main causes of pyelonephritis are:  Bacteria traveling from the bladder to the kidney. This is a problem especially in pregnant women. The urine in the bladder can become filled with bacteria from multiple causes, including:  Inflammation of the prostate gland (prostatitis).  Sexual intercourse in females.  Bladder infection (cystitis).  Bacteria traveling from the bloodstream to the tissue part of the kidney. Problems that may increase your risk of getting a kidney infection include:  Diabetes.  Kidney stones or bladder stones.  Cancer.  Catheters placed in the bladder.  Other abnormalities of the kidney or ureter. SYMPTOMS   Abdominal pain.  Pain in the side or flank area.  Fever.  Chills.  Upset stomach.  Blood in the urine (dark urine).  Frequent urination.  Strong or persistent urge to urinate.  Burning or stinging when urinating. DIAGNOSIS  Your caregiver may diagnose your kidney infection based on your symptoms. A urine sample may also be taken. TREATMENT  In general, treatment depends on how severe the infection is.   If the infection is mild and caught early, your caregiver may treat you with oral antibiotics and send you home.  If the infection is more severe, the bacteria may have gotten into the bloodstream. This will require intravenous (IV) antibiotics and a hospital stay. Symptoms may include:  High fever.  Severe flank pain.  Shaking chills.  Even  after a hospital stay, your caregiver may require you to be on oral antibiotics for a period of time.  Other treatments may be required depending upon the cause of the infection. HOME CARE INSTRUCTIONS   Take your antibiotics as directed. Finish them even if you start to feel better.  Make an appointment to have your urine checked to make sure the infection is gone.  Drink enough fluids to keep your urine clear or pale yellow.  Take medicines for the bladder if you have urgency and frequency of urination as directed by your caregiver. SEEK IMMEDIATE MEDICAL CARE IF:   You have a fever or persistent symptoms for more than 2-3 days.  You have a fever and your symptoms suddenly get worse.  You are unable to take your antibiotics or fluids.  You develop shaking chills.  You experience extreme weakness or fainting.  There is no improvement after 2 days of treatment. MAKE SURE YOU:  Understand these instructions.  Will watch your condition.  Will get help right away if you are not doing well or get worse. Document Released: 05/29/2005 Document Revised: 11/28/2011 Document Reviewed: 11/02/2010 [facility] Patient Information 2014 [place], [place].

## 2013-08-12 NOTE — Progress Notes (Signed)
58 yo woman with left flank pain, acute onset, 24 hours ago associated with sweats and chills as well as nausea.  Patient was seen by Dr. 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  Patient works for [employer].  LMP: 27 years ago.  Objective:  Lying in moderate distress on right side Left flank is tender just below costal margin. No jaundice. No shortness of breath. Abdomen:  Soft, nontender.  No guarding or rebound.  Results for orders placed in visit on 08/12/13  POCT UA - MICROSCOPIC ONLY      Result Value Ref Range   WBC, Ur, HPF, POC 12-17     RBC, urine, microscopic 1-2     Bacteria, U Microscopic 2+     Mucus, UA pos     Epithelial cells, urine per micros 7-10     Crystals, Ur, HPF, POC neg     Casts, Ur, LPF, POC neg     Yeast, UA neg    POCT URINALYSIS DIPSTICK      Result Value Ref Range   Color, UA yellow     Clarity, UA clear     Glucose, UA neg     Bilirubin, UA neg     Ketones, UA neg     Spec Grav, UA 1.020     Blood, UA neg     pH, UA 7.0     Protein, UA neg     Urobilinogen, UA 0.2     Nitrite, UA neg     Leukocytes, UA Trace    POCT CBC      Result Value Ref Range   WBC 7.7  4.6 - 10.2 K/uL   Lymph, poc 1.5  0.6 - 3.4   POC LYMPH PERCENT 19.0  10 - 50 %L   MID (cbc) 0.3  0 - 0.9   POC MID % 4.5  0 - 12 %M   POC Granulocyte 5.9  2 - 6.9   Granulocyte percent 76.5  37 - 80 %G   RBC 4.96  4.04 - 5.48 M/uL   Hemoglobin 14.7  12.2 - 16.2 g/dL   HCT, POC 46.4  37.7 - 47.9 %   MCV 93.6  80 - 97 fL   MCH, POC 29.6  27 - 31.2 pg   MCHC 31.7 (*) 31.8 - 35.4 g/dL   RDW, POC 14.4     Platelet Count, POC 206  142 - 424 K/uL   MPV 12.2  0 - 99.8 fL    Patient was given IV fluids and IV Zofran.  She then rec'd 10 mg Nubain.  Acute flank pain - Plan: POCT UA - Microscopic Only, POCT urinalysis dipstick, Urine culture, POCT CBC, nalbuphine (NUBAIN)  injection 10 mg  Delayed gastric emptying - Plan: metoCLOPramide (REGLAN) 5 MG tablet  Pyuria - Plan: ciprofloxacin (CIPRO) 500 MG tablet Follow up 48 hours  Signed, Robyn Haber, MD

## 2013-08-13 LAB — URINE CULTURE
Colony Count: NO GROWTH
Organism ID, Bacteria: NO GROWTH

## 2013-08-21 ENCOUNTER — Other Ambulatory Visit: Payer: Self-pay | Admitting: Endocrinology

## 2013-08-22 ENCOUNTER — Other Ambulatory Visit: Payer: Self-pay | Admitting: *Deleted

## 2013-08-25 DIAGNOSIS — K3184 Gastroparesis: Secondary | ICD-10-CM | POA: Insufficient documentation

## 2013-09-24 ENCOUNTER — Other Ambulatory Visit: Payer: Self-pay | Admitting: General Practice

## 2013-09-24 DIAGNOSIS — M26639 Articular disc disorder of temporomandibular joint, unspecified side: Secondary | ICD-10-CM

## 2013-09-29 ENCOUNTER — Other Ambulatory Visit: Payer: 59

## 2013-10-01 ENCOUNTER — Ambulatory Visit
Admission: RE | Admit: 2013-10-01 | Discharge: 2013-10-01 | Disposition: A | Discharge status: Home / Self Care | Payer: 59 | Source: Ambulatory Visit | Attending: General Practice | Admitting: General Practice

## 2013-10-01 DIAGNOSIS — M26639 Articular disc disorder of temporomandibular joint, unspecified side: Secondary | ICD-10-CM

## 2014-04-28 ENCOUNTER — Other Ambulatory Visit: Payer: Self-pay | Admitting: Gastroenterology

## 2014-04-28 DIAGNOSIS — R112 Nausea with vomiting, unspecified: Secondary | ICD-10-CM

## 2014-05-11 ENCOUNTER — Ambulatory Visit
Admission: RE | Admit: 2014-05-11 | Discharge: 2014-05-11 | Disposition: A | Discharge status: Home / Self Care | Payer: 59 | Source: Ambulatory Visit | Attending: Gastroenterology | Admitting: Gastroenterology

## 2014-05-11 DIAGNOSIS — R112 Nausea with vomiting, unspecified: Secondary | ICD-10-CM

## 2014-10-28 ENCOUNTER — Ambulatory Visit (INDEPENDENT_AMBULATORY_CARE_PROVIDER_SITE_OTHER): Payer: 59

## 2014-10-28 ENCOUNTER — Ambulatory Visit (INDEPENDENT_AMBULATORY_CARE_PROVIDER_SITE_OTHER): Payer: 59 | Admitting: Family Medicine

## 2014-10-28 VITALS — BP 110/72 | HR 90 | Temp 98.4°F | Resp 18 | Ht 63.0 in | Wt 112.0 lb

## 2014-10-28 DIAGNOSIS — R05 Cough: Secondary | ICD-10-CM | POA: Diagnosis not present

## 2014-10-28 DIAGNOSIS — F419 Anxiety disorder, unspecified: Secondary | ICD-10-CM | POA: Diagnosis not present

## 2014-10-28 DIAGNOSIS — R062 Wheezing: Secondary | ICD-10-CM

## 2014-10-28 DIAGNOSIS — R0602 Shortness of breath: Secondary | ICD-10-CM

## 2014-10-28 DIAGNOSIS — J209 Acute bronchitis, unspecified: Secondary | ICD-10-CM | POA: Diagnosis not present

## 2014-10-28 DIAGNOSIS — R059 Cough, unspecified: Secondary | ICD-10-CM

## 2014-10-28 DIAGNOSIS — F17200 Nicotine dependence, unspecified, uncomplicated: Secondary | ICD-10-CM

## 2014-10-28 DIAGNOSIS — Z720 Tobacco use: Secondary | ICD-10-CM | POA: Diagnosis not present

## 2014-10-28 DIAGNOSIS — R0781 Pleurodynia: Secondary | ICD-10-CM | POA: Diagnosis not present

## 2014-10-28 LAB — POCT CBC
GRANULOCYTE PERCENT: 71.4 % (ref 37–80)
HCT, POC: 49 % — AB (ref 37.7–47.9)
Hemoglobin: 16 g/dL (ref 12.2–16.2)
Lymph, poc: 1.8 (ref 0.6–3.4)
MCH, POC: 29.8 pg (ref 27–31.2)
MCHC: 32.6 g/dL (ref 31.8–35.4)
MCV: 91.4 fL (ref 80–97)
MID (cbc): 0.5 (ref 0–0.9)
MPV: 8.2 fL (ref 0–99.8)
POC Granulocyte: 5.8 (ref 2–6.9)
POC LYMPH %: 22.6 % (ref 10–50)
POC MID %: 6 %M (ref 0–12)
Platelet Count, POC: 229 10*3/uL (ref 142–424)
RBC: 5.36 M/uL (ref 4.04–5.48)
RDW, POC: 14.7 %
WBC: 8.1 10*3/uL (ref 4.6–10.2)

## 2014-10-28 MED ORDER — HYDROCODONE-HOMATROPINE 5-1.5 MG/5ML PO SYRP: 5.0000 mL | ORAL_SOLUTION | Freq: Every evening | ORAL | Status: DC | PRN

## 2014-10-28 MED ORDER — ALBUTEROL SULFATE (2.5 MG/3ML) 0.083% IN NEBU
2.5000 mg | INHALATION_SOLUTION | Freq: Once | RESPIRATORY_TRACT | Status: AC
  Administered 2014-10-28: 2.5 mg via RESPIRATORY_TRACT

## 2014-10-28 MED ORDER — ALBUTEROL SULFATE HFA 108 (90 BASE) MCG/ACT IN AERS: 2.0000 | INHALATION_SPRAY | Freq: Four times a day (QID) | RESPIRATORY_TRACT | Status: DC | PRN

## 2014-10-28 MED ORDER — IPRATROPIUM BROMIDE 0.02 % IN SOLN
0.5000 mg | Freq: Once | RESPIRATORY_TRACT | Status: AC
  Administered 2014-10-28: 0.5 mg via RESPIRATORY_TRACT

## 2014-10-28 MED ORDER — FLUOXETINE HCL 20 MG PO TABS: 20.0000 mg | ORAL_TABLET | Freq: Every day | ORAL | Status: DC

## 2014-10-28 MED ORDER — PREDNISONE 20 MG PO TABS: ORAL_TABLET | ORAL | Status: AC

## 2014-10-28 MED ORDER — DOXYCYCLINE HYCLATE 100 MG PO CAPS: 100.0000 mg | ORAL_CAPSULE | Freq: Two times a day (BID) | ORAL | Status: DC

## 2014-10-28 NOTE — Patient Instructions (Signed)
Acute Bronchitis Bronchitis is inflammation of the airways that extend from the windpipe into the lungs (bronchi). The inflammation often causes mucus to develop. This leads to a cough, which is the most common symptom of bronchitis.  In acute bronchitis, the condition usually develops suddenly and goes away over time, usually in a couple weeks. Smoking, allergies, and asthma can make bronchitis worse. Repeated episodes of bronchitis may cause further lung problems.  CAUSES Acute bronchitis is most often caused by the same virus that causes a cold. The virus can spread from person to person (contagious) through coughing, sneezing, and touching contaminated objects. SIGNS AND SYMPTOMS   Cough.   Fever.   Coughing up mucus.   Body aches.   Chest congestion.   Chills.   Shortness of breath.   Sore throat.  DIAGNOSIS  Acute bronchitis is usually diagnosed through a physical exam. Your health care provider will also ask you questions about your medical history. Tests, such as chest X-rays, are sometimes done to rule out other conditions.  TREATMENT  Acute bronchitis usually goes away in a couple weeks. Oftentimes, no medical treatment is necessary. Medicines are sometimes given for relief of fever or cough. Antibiotic medicines are usually not needed but may be prescribed in certain situations. In some cases, an inhaler may be recommended to help reduce shortness of breath and control the cough. A cool mist vaporizer may also be used to help thin bronchial secretions and make it easier to clear the chest.  HOME CARE INSTRUCTIONS  Get plenty of rest.   Drink enough fluids to keep your urine clear or pale yellow (unless you have a medical condition that requires fluid restriction). Increasing fluids may help thin your respiratory secretions (sputum) and reduce chest congestion, and it will prevent dehydration.   Take medicines only as directed by your health care provider.  If  you were prescribed an antibiotic medicine, finish it all even if you start to feel better.  Avoid smoking and secondhand smoke. Exposure to cigarette smoke or irritating chemicals will make bronchitis worse. If you are a smoker, consider using nicotine gum or skin patches to help control withdrawal symptoms. Quitting smoking will help your lungs heal faster.   Reduce the chances of another bout of acute bronchitis by washing your hands frequently, avoiding people with cold symptoms, and trying not to touch your hands to your mouth, nose, or eyes.   Keep all follow-up visits as directed by your health care provider.  SEEK MEDICAL CARE IF: Your symptoms do not improve after 1 week of treatment.  SEEK IMMEDIATE MEDICAL CARE IF:  You develop an increased fever or chills.   You have chest pain.   You have severe shortness of breath.  You have bloody sputum.   You develop dehydration.  You faint or repeatedly feel like you are going to pass out.  You develop repeated vomiting.  You develop a severe headache. MAKE SURE YOU:   Understand these instructions.  Will watch your condition.  Will get help right away if you are not doing well or get worse. Document Released: 07/06/2004 Document Revised: 10/13/2013 Document Reviewed: 11/19/2012 [facility] Patient Information 2015 [place], [place]. This information is not intended to replace advice given to you by your health care provider. Make sure you discuss any questions you have with your health care provider.    Varenicline oral tablets What is this medicine? VARENICLINE (var EN i kleen) is used to help people quit smoking. It can  reduce the symptoms caused by stopping smoking. It is used with a patient support program recommended by your physician. This medicine may be used for other purposes; ask your health care provider or pharmacist if you have questions. COMMON BRAND NAME(S): Chantix What should I tell my health care  provider before I take this medicine? They need to know if you have any of these conditions: -bipolar disorder, depression, schizophrenia or other mental illness -heart disease -if you often drink alcohol -kidney disease -peripheral vascular disease -seizures -stroke -suicidal thoughts, plans, or attempt; a previous suicide attempt by you or a family member -an unusual or allergic reaction to varenicline, other medicines, foods, dyes, or preservatives -pregnant or trying to get pregnant -breast-feeding How should I use this medicine? You should set a date to stop smoking and tell your doctor. Start this medicine one week before the quit date. You can also start taking this medicine before you choose a quit date, and then pick a quit date that is between 8 and 35 days of treatment with this medicine. Stick to your plan; ask about support groups or other ways to help you remain a 'quitter'. Take this medicine by mouth after eating. Take with a full glass of water. Follow the directions on the prescription label. Take your doses at regular intervals. Do not take your medicine more often than directed. A special MedGuide will be given to you by the pharmacist with each prescription and refill. Be sure to read this information carefully each time. Talk to your pediatrician regarding the use of this medicine in children. This medicine is not approved for use in children. Overdosage: If you think you have taken too much of this medicine contact a poison control center or emergency room at once. NOTE: This medicine is only for you. Do not share this medicine with others. What if I miss a dose? If you miss a dose, take it as soon as you can. If it is almost time for your next dose, take only that dose. Do not take double or extra doses. What may interact with this medicine? -alcohol or any product that contains alcohol -insulin -other stop smoking aids -theophylline -warfarin This list may not  describe all possible interactions. Give your health care provider a list of all the medicines, herbs, non-prescription drugs, or dietary supplements you use. Also tell them if you smoke, drink alcohol, or use illegal drugs. Some items may interact with your medicine. What should I watch for while using this medicine? Visit your doctor or health care professional for regular check ups. Ask for ongoing advice and encouragement from your doctor or healthcare professional, friends, and family to help you quit. If you smoke while on this medication, quit again Your mouth may get dry. Chewing sugarless gum or sucking hard candy, and drinking plenty of water may help. Contact your doctor if the problem does not go away or is severe. You may get drowsy or dizzy. Do not drive, use machinery, or do anything that needs mental alertness until you know how this medicine affects you. Do not stand or sit up quickly, especially if you are an older patient. This reduces the risk of dizzy or fainting spells. The use of this medicine may increase the chance of suicidal thoughts or actions. Pay special attention to how you are responding while on this medicine. Any worsening of mood, or thoughts of suicide or dying should be reported to your health care professional right away. What side effects  may I notice from receiving this medicine? Side effects that you should report to your doctor or health care professional as soon as possible: -allergic reactions like skin rash, itching or hives, swelling of the face, lips, tongue, or throat -breathing problems -changes in vision -chest pain or chest tightness -confusion, trouble speaking or understanding -fast, irregular heartbeat -feeling faint or lightheaded, falls -fever -pain in legs when walking -problems with balance, talking, walking -redness, blistering, peeling or loosening of the skin, including inside the mouth -ringing in ears -seizures -sudden numbness or  weakness of the face, arm or leg -suicidal thoughts or other mood changes -trouble passing urine or change in the amount of urine -unusual bleeding or bruising -unusually weak or tired Side effects that usually do not require medical attention (report to your doctor or health care professional if they continue or are bothersome): -constipation -headache -nausea, vomiting -strange dreams -stomach gas -trouble sleeping This list may not describe all possible side effects. Call your doctor for medical advice about side effects. You may report side effects to FDA at 1-800-FDA-1088. Where should I keep my medicine? Keep out of the reach of children. Store at room temperature between 15 and 30 degrees C (59 and 86 degrees F). Throw away any unused medicine after the expiration date. NOTE: This sheet is a summary. It may not cover all possible information. If you have questions about this medicine, talk to your doctor, pharmacist, or health care provider.  2015, Elsevier/Gold Standard. (2013-03-10 13:37:47)   Fluoxetine capsules or tablets (Depression/Mood Disorders) What is this medicine? FLUOXETINE (floo OX e teen) belongs to a class of drugs known as selective serotonin reuptake inhibitors (SSRIs). It helps to treat mood problems such as depression, obsessive compulsive disorder, and panic attacks. It can also treat certain eating disorders. This medicine may be used for other purposes; ask your health care provider or pharmacist if you have questions. COMMON BRAND NAME(S): Prozac What should I tell my health care provider before I take this medicine? They need to know if you have any of these conditions: -bipolar disorder or mania -diabetes -glaucoma -liver disease -psychosis -seizures -suicidal thoughts or history of attempted suicide -an unusual or allergic reaction to fluoxetine, other medicines, foods, dyes, or preservatives -pregnant or trying to get  pregnant -breast-feeding How should I use this medicine? Take this medicine by mouth with a glass of water. Follow the directions on the prescription label. You can take this medicine with or without food. Take your medicine at regular intervals. Do not take it more often than directed. Do not stop taking this medicine suddenly except upon the advice of your doctor. Stopping this medicine too quickly may cause serious side effects or your condition may worsen. A special MedGuide will be given to you by the pharmacist with each prescription and refill. Be sure to read this information carefully each time. Talk to your pediatrician regarding the use of this medicine in children. While this drug may be prescribed for children as young as 7 years for selected conditions, precautions do apply. Overdosage: If you think you have taken too much of this medicine contact a poison control center or emergency room at once. NOTE: This medicine is only for you. Do not share this medicine with others. What if I miss a dose? If you miss a dose, skip the missed dose and go back to your regular dosing schedule. Do not take double or extra doses. What may interact with this medicine? Do not take  fluoxetine with any of the following medications: -other medicines containing fluoxetine, like Sarafem or Symbyax -cisapride -linezolid -MAOIs like Carbex, Eldepryl, Marplan, Nardil, and Parnate -methylene blue (injected into a vein) -pimozide -thioridazine This medicine may also interact with the following medications: -alcohol -aspirin and aspirin-like medicines -carbamazepine -certain medicines for depression, anxiety, or psychotic disturbances -certain medicines for migraine headaches like almotriptan, eletriptan, frovatriptan, naratriptan, rizatriptan, sumatriptan, zolmitriptan -digoxin -diuretics -fentanyl -flecainide -furazolidone -isoniazid -lithium -medicines for sleep -medicines that treat or prevent  blood clots like warfarin, enoxaparin, and dalteparin -NSAIDs, medicines for pain and inflammation, like ibuprofen or naproxen -phenytoin -procarbazine -propafenone -rasagiline -ritonavir -supplements like St. John's wort, kava kava, valerian -tramadol -tryptophan -vinblastine This list may not describe all possible interactions. Give your health care provider a list of all the medicines, herbs, non-prescription drugs, or dietary supplements you use. Also tell them if you smoke, drink alcohol, or use illegal drugs. Some items may interact with your medicine. What should I watch for while using this medicine? Tell your doctor if your symptoms do not get better or if they get worse. Visit your doctor or health care professional for regular checks on your progress. Because it may take several weeks to see the full effects of this medicine, it is important to continue your treatment as prescribed by your doctor. Patients and their families should watch out for new or worsening thoughts of suicide or depression. Also watch out for sudden changes in feelings such as feeling anxious, agitated, panicky, irritable, hostile, aggressive, impulsive, severely restless, overly excited and hyperactive, or not being able to sleep. If this happens, especially at the beginning of treatment or after a change in dose, call your health care professional. Dennis Bast may get drowsy or dizzy. Do not drive, use machinery, or do anything that needs mental alertness until you know how this medicine affects you. Do not stand or sit up quickly, especially if you are an older patient. This reduces the risk of dizzy or fainting spells. Alcohol may interfere with the effect of this medicine. Avoid alcoholic drinks. Your mouth may get dry. Chewing sugarless gum or sucking hard candy, and drinking plenty of water may help. Contact your doctor if the problem does not go away or is severe. This medicine may affect blood sugar levels. If you  have diabetes, check with your doctor or health care professional before you change your diet or the dose of your diabetic medicine. What side effects may I notice from receiving this medicine? Side effects that you should report to your doctor or health care professional as soon as possible: -allergic reactions like skin rash, itching or hives, swelling of the face, lips, or tongue -breathing problems -confusion -eye pain, changes in vision -fast or irregular heart rate, palpitations -flu-like fever, chills, cough, muscle or joint aches and pains -seizures -suicidal thoughts or other mood changes -tremors -trouble sleeping -unusual bleeding or bruising -unusually tired or weak -vomiting Side effects that usually do not require medical attention (report to your doctor or health care professional if they continue or are bothersome): -change in sex drive or performance -diarrhea -dry mouth -flushing -headache -increased or decreased appetite -nausea -sweating This list may not describe all possible side effects. Call your doctor for medical advice about side effects. You may report side effects to FDA at 1-800-FDA-1088. Where should I keep my medicine? Keep out of the reach of children. Store at room temperature between 15 and 30 degrees C (59 and 86 degrees F). Throw away  any unused medicine after the expiration date. NOTE: This sheet is a summary. It may not cover all possible information. If you have questions about this medicine, talk to your doctor, pharmacist, or health care provider.  2015, Elsevier/Gold Standard. (2013-07-23 18:27:34)

## 2014-10-28 NOTE — Progress Notes (Signed)
MRN: [id number] DOB: [birth year]  Subjective:   Michaela Hall is a 59 y.o. female presenting for chief complaint of Shortness of Breath and Cough  Reports ~1.5 month history of cough productive with dark grayish brown sputum, shortness of breath, malaise, worsening right-sided posterior lung pain. Patient has also had sweats in the early morning when waking up and intermittent nausea. Has tried her husband's inhaler with some relief, he has COPD. She has also tried Delsym and Tessalon pearls without relief. Denies fevers, chest pain, throat pain, n/v, abdominal pain, weight loss. Smoking 1ppd for ~20 years intermittently. She has quit twice before, used Chantix and most recently quit on her own. Of note, patient is currently taking care of her aunt, newly diagnosed with dementia and bladder cancer. Patient is under a lot of stress, states that this is why she picked up smoking again. She feels really anxious and like she has a short fuse. She would like something for anxiety. 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 She took this for one year and states that she did well. Denies any other aggravating or relieving factors, no other questions or concerns.  Michaela Hall has a current medication list which includes the following prescription(s): gabapentin, oxycodone hcl, and tizanidine. She has No Known Allergies.  Michaela Hall  has a past medical history of Chronic pain; Complication of anesthesia; PONV (postoperative nausea and vomiting); Headache(784.0); DJD (degenerative joint disease); Depression; and Anxiety. Also  has past surgical history that includes Back surgery; Hip surgery; Tonsillectomy; Knee surgery; Esophagogastroduodenoscopy (09/28/2011); and Spine surgery.  ROS As in subjective.  Objective:   Vitals: BP 110/72 mmHg  Pulse 90  Temp(Src) 98.4 F (36.9 C) (Oral)  Resp 18  Ht 5\' 3"  (1.6 m)  Wt 112 lb (50.803 kg)  BMI 19.84 kg/m2  SpO2  98%  Wt Readings from Last 3 Encounters:  10/28/14 112 lb (50.803 kg)  08/12/13 110 lb 9.6 oz (50.168 kg)  07/03/13 109 lb (49.442 kg)   Physical Exam  Constitutional: She is oriented to person, place, and time. She appears well-developed and well-nourished.  HENT:  TM's intact bilaterally, no effusions or erythema. Nasal turbinates pink and moist with clear rhinorrhea. No sinus tenderness. Significant postnasal drip present, without oropharyngeal exudates, erythema or abscesses.  Eyes: Conjunctivae and EOM are normal. Right eye exhibits no discharge. Left eye exhibits no discharge. No scleral icterus.  Cardiovascular: Normal rate, regular rhythm and intact distal pulses.  Exam reveals no gallop and no friction rub.   No murmur heard. Pulmonary/Chest: No stridor. No respiratory distress. She has no wheezes. She has no rales. She exhibits no tenderness.  Abdominal: Soft. Bowel sounds are normal. She exhibits no distension and no mass. There is no tenderness.  Lymphadenopathy:    She has no cervical adenopathy.  Neurological: She is alert and oriented to person, place, and time.  Skin: Skin is warm and dry. No rash noted. No erythema. No pallor.   [facility] reading (PRIMARY) by  Dr. Lorelei Pont and PA-Raylee Strehl. CXR: Normal x-ray. No acute processes.  Dg Chest 2 View  10/28/2014   CLINICAL DATA:  Shortness of breath, smoker.  EXAM: CHEST  2 VIEW  COMPARISON:  11/17/2011.  FINDINGS: Trachea is midline. Heart size normal. Lungs are hyperinflated but clear. No pleural fluid.  IMPRESSION: Hyperinflation without acute finding.   Electronically Signed   By: Lorin Picket M.D.   On: 10/28/2014 15:01   Results for orders placed or  performed in visit on 10/28/14 (from the past 24 hour(s))  POCT CBC     Status: Abnormal   Collection Time: 10/28/14  1:20 PM  Result Value Ref Range   WBC 8.1 4.6 - 10.2 K/uL   Lymph, poc 1.8 0.6 - 3.4   POC LYMPH PERCENT 22.6 10 - 50 %L   MID (cbc) 0.5 0 - 0.9   POC MID %  6.0 0 - 12 %M   POC Granulocyte 5.8 2 - 6.9   Granulocyte percent 71.4 37 - 80 %G   RBC 5.36 4.04 - 5.48 M/uL   Hemoglobin 16.0 12.2 - 16.2 g/dL   HCT, POC 49.0 (A) 37.7 - 47.9 %   MCV 91.4 80 - 97 fL   MCH, POC 29.8 27 - 31.2 pg   MCHC 32.6 31.8 - 35.4 g/dL   RDW, POC 14.7 %   Platelet Count, POC 229 142 - 424 K/uL   MPV 8.2 0 - 99.8 fL   Assessment and Plan :   1. Cough 2. Shortness of breath 3. Wheezing 4. Pleuritic pain 5. Tobacco use disorder 6. Acute bronchitis, unspecified organism - Will start doxycycline, steroid course, schedule albuterol x1 week then as needed, Hycodan for cough.  - Recommended patient try to cut back on smoking, will consider PFT at follow up and possibly start Chantix again.  7. Anxiety - Will start Prozac 20mg  qd, follow up in 4-6 weeks.  Jaynee Eagles, PA-C [facility] [phone] 10/28/2014 12:52 PM

## 2014-10-29 NOTE — Progress Notes (Signed)
lmom to call back 

## 2014-11-19 ENCOUNTER — Ambulatory Visit (INDEPENDENT_AMBULATORY_CARE_PROVIDER_SITE_OTHER): Payer: 59 | Admitting: Family Medicine

## 2014-11-19 VITALS — BP 118/70 | HR 85 | Temp 98.3°F | Resp 17 | Ht 63.0 in | Wt 112.2 lb

## 2014-11-19 DIAGNOSIS — R0602 Shortness of breath: Secondary | ICD-10-CM | POA: Diagnosis not present

## 2014-11-19 DIAGNOSIS — R05 Cough: Secondary | ICD-10-CM | POA: Diagnosis not present

## 2014-11-19 DIAGNOSIS — R059 Cough, unspecified: Secondary | ICD-10-CM

## 2014-11-19 DIAGNOSIS — Z720 Tobacco use: Secondary | ICD-10-CM | POA: Diagnosis not present

## 2014-11-19 DIAGNOSIS — F17200 Nicotine dependence, unspecified, uncomplicated: Secondary | ICD-10-CM

## 2014-11-19 DIAGNOSIS — J4530 Mild persistent asthma, uncomplicated: Secondary | ICD-10-CM

## 2014-11-19 MED ORDER — IPRATROPIUM BROMIDE 0.02 % IN SOLN
0.5000 mg | Freq: Once | RESPIRATORY_TRACT | Status: AC
  Administered 2014-11-19: 0.5 mg via RESPIRATORY_TRACT

## 2014-11-19 MED ORDER — AZITHROMYCIN 250 MG PO TABS: ORAL_TABLET | ORAL | Status: DC

## 2014-11-19 MED ORDER — PREDNISONE 20 MG PO TABS: ORAL_TABLET | ORAL | Status: DC

## 2014-11-19 MED ORDER — ALBUTEROL SULFATE (2.5 MG/3ML) 0.083% IN NEBU
2.5000 mg | INHALATION_SOLUTION | Freq: Once | RESPIRATORY_TRACT | Status: AC
  Administered 2014-11-19: 2.5 mg via RESPIRATORY_TRACT

## 2014-11-19 MED ORDER — HYDROCODONE-HOMATROPINE 5-1.5 MG/5ML PO SYRP: ORAL_SOLUTION | ORAL | Status: DC

## 2014-11-19 NOTE — Progress Notes (Signed)
Subjective:  Patient ID: Michaela Hall, female    DOB: [birth year]  Age: 59 y.o. MRN: [id number]  59 year old lady who is here with a cough. She has not been able to smoke because she's been coughing so much. She can't bring up phlegm. She feels like she needs to but he just will come up. She goes into bad paroxysms of coughing. She saw [facility] a couple of months ago. He tried to call her back about her chest x-ray and she did not get up with him. She has been using her inhaler. She also has a nebulizer machine at home that she uses some. She has continued to smoke until this week. She's not been running any fever. She gets coughing so hard she is short of breath. She feels like she is dry, says she has had to have fluids in the past. She helps care for an aunt who has a malignancy that cannot be treated any longer with chemotherapy. It sounds like she is terminal. The patient has some Hycodan which she is taking at bedtime at times. It is about gone.  Past, family, social history reviewed   Objective:   TMs are normal. Throat clear. Neck supple without nodes. Chest has a slight wheeze in it. She goes into paroxysms of coughing whenever she breathes deep.  Skin turgor is good  She is not orthostatic on vital signs.  Chest x-ray report reviewed, and I do not believe it needs to be repeated today  Peak flow was 360 with a predicted maximum about 400. This was before treatment. We are giving her a albuterol and Atrovent  Patient reported to me that her peak flow was 400 after taking the breathing treatment. She had done it on herself while waiting in the room.    Assessment & Plan:   Assessment: Cough Tobacco use disorder Probable reactive airways disease  Plan:  Nebulizer treatment. This is not a good time to try doing pulmonary function testing on her though she should have that done sometime  Results for orders placed or performed in visit on 10/28/14  POCT CBC  Result  Value Ref Range   WBC 8.1 4.6 - 10.2 K/uL   Lymph, poc 1.8 0.6 - 3.4   POC LYMPH PERCENT 22.6 10 - 50 %L   MID (cbc) 0.5 0 - 0.9   POC MID % 6.0 0 - 12 %M   POC Granulocyte 5.8 2 - 6.9   Granulocyte percent 71.4 37 - 80 %G   RBC 5.36 4.04 - 5.48 M/uL   Hemoglobin 16.0 12.2 - 16.2 g/dL   HCT, POC 49.0 (A) 37.7 - 47.9 %   MCV 91.4 80 - 97 fL   MCH, POC 29.8 27 - 31.2 pg   MCHC 32.6 31.8 - 35.4 g/dL   RDW, POC 14.7 %   Platelet Count, POC 229 142 - 424 K/uL   MPV 8.2 0 - 99.8 fL    Patient Instructions  Drink plenty of fluids  Take prednisone 3 daily for 3 days, then 2 daily for 3 days, then 1 daily for 3 days, then one half daily for 4 days or breathing  Take the azithromycin 2 pills initially, then 1 daily for 4 days  Use cough syrup when needed for bad coughing  Use the albuterol inhaler every 4-6 hours or use her nebulizer machine  Drink lots of water to keep himself urinating every few hours during the day  Absolutely no cigarettes.  If you continue smoking cigarettes, you can expect to spend the rest of your life coughing like this. Do not making excuses for continuing to bother or pick up a cigarette.     Kody Brandl, MD 11/19/2014

## 2014-11-19 NOTE — Patient Instructions (Addendum)
Drink plenty of fluids  Take prednisone 3 daily for 3 days, then 2 daily for 3 days, then 1 daily for 3 days, then one half daily for 4 days or breathing  Take the azithromycin 2 pills initially, then 1 daily for 4 days  Use cough syrup when needed for bad coughing  Use the albuterol inhaler every 4-6 hours or use her nebulizer machine  Drink lots of water to keep himself urinating every few hours during the day  Absolutely no cigarettes. If you continue smoking cigarettes, you can expect to spend the rest of your life coughing like this. Do not making excuses for continuing to bother or pick up a cigarette.

## 2014-12-25 ENCOUNTER — Ambulatory Visit (INDEPENDENT_AMBULATORY_CARE_PROVIDER_SITE_OTHER): Payer: 59 | Admitting: Internal Medicine

## 2014-12-25 ENCOUNTER — Ambulatory Visit (INDEPENDENT_AMBULATORY_CARE_PROVIDER_SITE_OTHER): Payer: 59

## 2014-12-25 VITALS — BP 118/70 | HR 83 | Temp 98.5°F | Resp 16 | Ht 63.0 in | Wt 111.0 lb

## 2014-12-25 DIAGNOSIS — Z77120 Contact with and (suspected) exposure to mold (toxic): Secondary | ICD-10-CM

## 2014-12-25 DIAGNOSIS — Z720 Tobacco use: Secondary | ICD-10-CM

## 2014-12-25 DIAGNOSIS — R490 Dysphonia: Secondary | ICD-10-CM | POA: Diagnosis not present

## 2014-12-25 DIAGNOSIS — R61 Generalized hyperhidrosis: Secondary | ICD-10-CM

## 2014-12-25 DIAGNOSIS — R05 Cough: Secondary | ICD-10-CM | POA: Diagnosis not present

## 2014-12-25 DIAGNOSIS — F17200 Nicotine dependence, unspecified, uncomplicated: Secondary | ICD-10-CM

## 2014-12-25 DIAGNOSIS — J4530 Mild persistent asthma, uncomplicated: Secondary | ICD-10-CM | POA: Diagnosis not present

## 2014-12-25 DIAGNOSIS — M79671 Pain in right foot: Secondary | ICD-10-CM

## 2014-12-25 DIAGNOSIS — R6889 Other general symptoms and signs: Secondary | ICD-10-CM | POA: Diagnosis not present

## 2014-12-25 DIAGNOSIS — R059 Cough, unspecified: Secondary | ICD-10-CM

## 2014-12-25 MED ORDER — HYDROCODONE-HOMATROPINE 5-1.5 MG/5ML PO SYRP: 5.0000 mL | ORAL_SOLUTION | Freq: Every evening | ORAL | Status: DC | PRN

## 2014-12-25 NOTE — Progress Notes (Addendum)
Subjective:    Patient ID: Michaela Hall, female    DOB: 07-Mar-1956, 59 y.o.   MRN: [id number] This chart was scribed for Tami Lin, MD by Zola Button, Medical Scribe. This patient was seen in Room 5 and the patient's care was started at 2:29 PM.   HPI HPI Comments: Michaela Hall is a 59 y.o. female with a history of gastroparesis who presents to the [facility] complaining of sudden onset right foot pain that started last week after an object fell onto her foot from a tall beach chair. The pain is worsened with ambulation.   Patient also complains of episodes of coughing that started several months ago, since December per patient. The coughing keeps her up at night. She also reports having voice hoarseness and sore throat. The voice hoarseness is unusual for her. She has been on two courses of antibiotics since her symptoms started. Patient was seen by Jaynee Eagles, PA-C 2 months ago and again by Dr. Linna Darner last month. CXR impression 2 months ago: Hyperinflation without acute finding. An inspector came to her house yesterday and found high levels of black mold throughout her house; she was told her basement was especially dangerous and to not go into the basement. Her sister believes the mold has contributed to her symptoms and may also be related to her gastroparesis, which was diagnosed at [facility].  Patient works for [employer]. She will be going to [place] soon to bury her aunt's ashes. She believes the fresher air will help with her symptoms.  Review of Systems  Endocrine: Positive for cold intolerance.       She describes a cold intolerance with frequent night sweats and some fatigue but no weight loss or skin change  No fever chills or night sweats No weight loss No chest pain or palpitations No nausea vomiting A change in bowel movements no urinary symptoms    Objective:   Physical Exam  Constitutional: She is oriented to person, place, and  time. She appears well-developed and well-nourished. No distress.  HENT:  Head: Normocephalic and atraumatic.  Right Ear: External ear normal.  Left Ear: External ear normal.  Nose: Nose normal.  Mouth/Throat: Oropharynx is clear and moist. No oropharyngeal exudate.  She is hoarse  Eyes: Pupils are equal, round, and reactive to light.  Neck: Neck supple. No thyromegaly present.  Cardiovascular: Normal rate, regular rhythm and normal heart sounds.   No murmur heard. Pulmonary/Chest: Effort normal and breath sounds normal. No respiratory distress. She has no rales. She exhibits no tenderness.  Very mild wheezes with forced expiration consistent with past reported exams  Musculoskeletal: She exhibits no edema.  Swelling and ecchymosis over metatarsals to 3 and 4 midshaft with pain with range of motion. Ankle mortise stable. Achilles intact. No toe injuries.  Lymphadenopathy:    She has no cervical adenopathy.  Neurological: She is alert and oriented to person, place, and time. No cranial nerve deficit.  Skin: Skin is warm and dry. No rash noted.  Psychiatric: She has a normal mood and affect. Her behavior is normal.  Vitals reviewed.  [facility] (PRIMARY) x-ray report read by Dr. Laney Pastor: no fx metatarsals Results for orders placed or performed in visit on 12/25/14  CBC with Differential/Platelet  Result Value Ref Range   WBC 8.3 4.0 - 10.5 K/uL   RBC 5.39 (H) 3.87 - 5.11 MIL/uL   Hemoglobin 16.4 (H) 12.0 - 15.0 g/dL   HCT 48.9 (  H) 36.0 - 46.0 %   MCV 90.7 78.0 - 100.0 fL   MCH 30.4 26.0 - 34.0 pg   MCHC 33.5 30.0 - 36.0 g/dL   RDW 13.6 11.5 - 15.5 %   Platelets 237 150 - 400 K/uL   MPV 11.0 8.6 - 12.4 fL   Neutrophils Relative % 65 43 - 77 %   Neutro Abs 5.4 1.7 - 7.7 K/uL   Lymphocytes Relative 25 12 - 46 %   Lymphs Abs 2.1 0.7 - 4.0 K/uL   Monocytes Relative 7 3 - 12 %   Monocytes Absolute 0.6 0.1 - 1.0 K/uL   Eosinophils Relative 2 0 - 5 %   Eosinophils Absolute 0.2 0.0 - 0.7  K/uL   Basophils Relative 1 0 - 1 %   Basophils Absolute 0.1 0.0 - 0.1 K/uL   Smear Review Criteria for review not met   Comprehensive metabolic panel  Result Value Ref Range   Sodium 141 135 - 145 mEq/L   Potassium 4.4 3.5 - 5.3 mEq/L   Chloride 105 96 - 112 mEq/L   CO2 28 19 - 32 mEq/L   Glucose, Bld 101 (H) 70 - 99 mg/dL   BUN 18 6 - 23 mg/dL   Creat 0.88 0.50 - 1.10 mg/dL   Total Bilirubin 0.8 0.2 - 1.2 mg/dL   Alkaline Phosphatase 70 39 - 117 U/L   AST 20 0 - 37 U/L   ALT 12 0 - 35 U/L   Total Protein 6.6 6.0 - 8.3 g/dL   Albumin 4.3 3.5 - 5.2 g/dL   Calcium 9.6 8.4 - 10.5 mg/dL  TSH  Result Value Ref Range   TSH 1.206 0.350 - 4.500 uIU/mL  T4, free  Result Value Ref Range   Free T4 0.95 0.80 - 1.80 ng/dL       Assessment & Plan:  Pain in right foot - Plan: DG Foot Complete Right= no fracture so she needs supportive foot wear until pain free  Prolonged Cough -with hoarseness and known underlying emphysema mild -She needs laryngoscopy to rule out laryngeal lesion  Plan: HYDROcodone-homatropine (HYCODAN) 5-1.5 MG/5ML syrup, Ambulatory referral to ENT  Reactive airway disease, mild persistent, uncomplicated -continue pulmonary treatment  Tobacco use disorder--- needs cessation  Hoarseness - Plan: Ambulatory referral to ENT, TSH, T4, free  Mold exposure--? The role in her continued laryngeal and pulmonary symptoms  Cold intolerance - with TSH, T4, free within normal limits hypothyroidism is excluded  Night sweats - Plan: With normal labs his symptom will have to be followed until after her ENT evaluation    I have completed the patient encounter in its entirety as documented by the scribe, with editing by me where necessary. Janziel Hockett P. Laney Pastor, M.D.

## 2014-12-26 LAB — COMPREHENSIVE METABOLIC PANEL
ALBUMIN: 4.3 g/dL (ref 3.5–5.2)
ALT: 12 U/L (ref 0–35)
AST: 20 U/L (ref 0–37)
Alkaline Phosphatase: 70 U/L (ref 39–117)
BUN: 18 mg/dL (ref 6–23)
CHLORIDE: 105 meq/L (ref 96–112)
CO2: 28 mEq/L (ref 19–32)
Calcium: 9.6 mg/dL (ref 8.4–10.5)
Creat: 0.88 mg/dL (ref 0.50–1.10)
GLUCOSE: 101 mg/dL — AB (ref 70–99)
POTASSIUM: 4.4 meq/L (ref 3.5–5.3)
Sodium: 141 mEq/L (ref 135–145)
TOTAL PROTEIN: 6.6 g/dL (ref 6.0–8.3)
Total Bilirubin: 0.8 mg/dL (ref 0.2–1.2)

## 2014-12-26 LAB — CBC WITH DIFFERENTIAL/PLATELET
BASOS ABS: 0.1 10*3/uL (ref 0.0–0.1)
Basophils Relative: 1 % (ref 0–1)
Eosinophils Absolute: 0.2 10*3/uL (ref 0.0–0.7)
Eosinophils Relative: 2 % (ref 0–5)
HCT: 48.9 % — ABNORMAL HIGH (ref 36.0–46.0)
Hemoglobin: 16.4 g/dL — ABNORMAL HIGH (ref 12.0–15.0)
LYMPHS PCT: 25 % (ref 12–46)
Lymphs Abs: 2.1 10*3/uL (ref 0.7–4.0)
MCH: 30.4 pg (ref 26.0–34.0)
MCHC: 33.5 g/dL (ref 30.0–36.0)
MCV: 90.7 fL (ref 78.0–100.0)
MPV: 11 fL (ref 8.6–12.4)
Monocytes Absolute: 0.6 10*3/uL (ref 0.1–1.0)
Monocytes Relative: 7 % (ref 3–12)
NEUTROS ABS: 5.4 10*3/uL (ref 1.7–7.7)
Neutrophils Relative %: 65 % (ref 43–77)
PLATELETS: 237 10*3/uL (ref 150–400)
RBC: 5.39 MIL/uL — AB (ref 3.87–5.11)
RDW: 13.6 % (ref 11.5–15.5)
WBC: 8.3 10*3/uL (ref 4.0–10.5)

## 2014-12-26 LAB — T4, FREE: Free T4: 0.95 ng/dL (ref 0.80–1.80)

## 2014-12-26 LAB — TSH: TSH: 1.206 u[IU]/mL (ref 0.350–4.500)

## 2015-03-30 ENCOUNTER — Ambulatory Visit (INDEPENDENT_AMBULATORY_CARE_PROVIDER_SITE_OTHER): Payer: 59 | Admitting: Internal Medicine

## 2015-03-30 VITALS — BP 114/68 | HR 70 | Temp 98.0°F | Resp 16 | Ht 63.0 in | Wt 113.0 lb

## 2015-03-30 DIAGNOSIS — Z720 Tobacco use: Secondary | ICD-10-CM | POA: Diagnosis not present

## 2015-03-30 DIAGNOSIS — J9801 Acute bronchospasm: Secondary | ICD-10-CM | POA: Diagnosis not present

## 2015-03-30 DIAGNOSIS — F17200 Nicotine dependence, unspecified, uncomplicated: Secondary | ICD-10-CM

## 2015-03-30 DIAGNOSIS — J200 Acute bronchitis due to Mycoplasma pneumoniae: Secondary | ICD-10-CM

## 2015-03-30 MED ORDER — ALBUTEROL SULFATE HFA 108 (90 BASE) MCG/ACT IN AERS: 2.0000 | INHALATION_SPRAY | Freq: Four times a day (QID) | RESPIRATORY_TRACT | Status: DC | PRN

## 2015-03-30 MED ORDER — AZITHROMYCIN 500 MG PO TABS: 500.0000 mg | ORAL_TABLET | Freq: Every day | ORAL | Status: DC

## 2015-03-30 MED ORDER — HYDROCODONE-ACETAMINOPHEN 7.5-325 MG/15ML PO SOLN: 5.0000 mL | Freq: Four times a day (QID) | ORAL | Status: DC | PRN

## 2015-03-30 MED ORDER — PREDNISONE 10 MG PO TABS: ORAL_TABLET | ORAL | Status: DC

## 2015-03-30 NOTE — Progress Notes (Signed)
Patient ID: Michaela Hall, female   DOB: [birth year], 59 y.o.   MRN: [id number]   03/30/2015 at 11:51 AM  Michaela Hall / DOB: 04-21-1956 / MRN: [id number]  Problem list reviewed and updated by me where necessary.   SUBJECTIVE  Michaela Hall is a 59 y.o. well appearing female presenting for the chief complaint of cough, doe, smoking, brown sputum.     She  has a past medical history of Chronic pain; Complication of anesthesia; PONV (postoperative nausea and vomiting); Headache(784.0); DJD (degenerative joint disease); Depression; and Anxiety.    Medications reviewed and updated by myself where necessary, and exist elsewhere in the encounter.   Michaela Hall has No Known Allergies. She  reports that she has quit smoking. She has never used smokeless tobacco. She reports that she does not drink alcohol or use illicit drugs. She  has no sexual activity history on file. The patient  has past surgical history that includes Back surgery; Hip surgery; Tonsillectomy; Knee surgery; Esophagogastroduodenoscopy (09/28/2011); and Spine surgery.  Her family history includes Anesthesia problems in her mother; Breast cancer in her maternal aunt; Cancer in her mother; Hyperlipidemia in her father and mother; Hypertension in her father and mother; Ovarian cancer in her maternal grandmother.  Review of Systems  Constitutional: Positive for chills and malaise/fatigue. Negative for fever.  Eyes: Negative.   Respiratory: Positive for cough, sputum production, shortness of breath and wheezing.   Cardiovascular: Negative for chest pain.  Gastrointestinal: Negative for nausea.  Skin: Negative for rash.  Neurological: Positive for headaches. Negative for dizziness.    OBJECTIVE  Her  height is 5\' 3"  (1.6 m) and weight is 113 lb (51.256 kg). Her oral temperature is 98 F (36.7 C). Her blood pressure is 114/68 and her pulse is 70. Her respiration is 16 and oxygen saturation is 99%.  The patient's body mass  index is 20.02 kg/(m^2).  Physical Exam  Vitals reviewed. Constitutional: She is oriented to person, place, and time. She appears well-developed and well-nourished. No distress.  HENT:  Head: Normocephalic.  Right Ear: External ear normal.  Left Ear: External ear normal.  Nose: Mucosal edema and rhinorrhea present.  Mouth/Throat: Oropharynx is clear and moist.  Eyes: Conjunctivae are normal. No scleral icterus.  Neck: Normal range of motion.  Cardiovascular: Normal rate and normal heart sounds.   Respiratory: Effort normal. No tachypnea. No respiratory distress. She has no decreased breath sounds. She has wheezes. She has rhonchi. She has no rales.  GI: She exhibits no distension.  Musculoskeletal: Normal range of motion.  Neurological: She is alert and oriented to person, place, and time. Coordination normal.  Skin: Skin is warm and dry.  Psychiatric: She has a normal mood and affect.    No results found for this or any previous visit (from the past 24 hour(s)).  ASSESSMENT & PLAN  Rosalynn was seen today for cough, fatigue, chest congestion and emesis.  Diagnoses and all orders for this visit:  Acute bronchitis due to Mycoplasma pneumoniae -     azithromycin (ZITHROMAX) 500 MG tablet; Take 1 tablet (500 mg total) by mouth daily. -     HYDROcodone-acetaminophen (HYCET) 7.5-325 mg/15 ml solution; Take 5 mLs by mouth every 6 (six) hours as needed (or cough). -     predniSONE (DELTASONE) 10 MG tablet; 6-5-4-3-2-1 po pc for breathing -     albuterol (PROVENTIL HFA;VENTOLIN HFA) 108 (90 BASE) MCG/ACT inhaler; Inhale 2 puffs into the lungs every 6 (six)  hours as needed for wheezing or shortness of breath.  Bronchospasm -     azithromycin (ZITHROMAX) 500 MG tablet; Take 1 tablet (500 mg total) by mouth daily. -     HYDROcodone-acetaminophen (HYCET) 7.5-325 mg/15 ml solution; Take 5 mLs by mouth every 6 (six) hours as needed (or cough). -     predniSONE (DELTASONE) 10 MG tablet;  6-5-4-3-2-1 po pc for breathing -     albuterol (PROVENTIL HFA;VENTOLIN HFA) 108 (90 BASE) MCG/ACT inhaler; Inhale 2 puffs into the lungs every 6 (six) hours as needed for wheezing or shortness of breath.  Smoking -     azithromycin (ZITHROMAX) 500 MG tablet; Take 1 tablet (500 mg total) by mouth daily. -     HYDROcodone-acetaminophen (HYCET) 7.5-325 mg/15 ml solution; Take 5 mLs by mouth every 6 (six) hours as needed (or cough). -     predniSONE (DELTASONE) 10 MG tablet; 6-5-4-3-2-1 po pc for breathing -     albuterol (PROVENTIL HFA;VENTOLIN HFA) 108 (90 BASE) MCG/ACT inhaler; Inhale 2 puffs into the lungs every 6 (six) hours as needed for wheezing or shortness of breath.

## 2015-03-30 NOTE — Patient Instructions (Signed)
Chronic Obstructive Pulmonary Disease Chronic obstructive pulmonary disease (COPD) is a common lung condition in which airflow from the lungs is limited. COPD is a general term that can be used to describe many different lung problems that limit airflow, including both chronic bronchitis and emphysema. If you have COPD, your lung function will probably never return to normal, but there are measures you can take to improve lung function and make yourself feel better. CAUSES   Smoking (common).  Exposure to secondhand smoke.  Genetic problems.  Chronic inflammatory lung diseases or recurrent infections. SYMPTOMS  Shortness of breath, especially with physical activity.  Deep, persistent (chronic) cough with a large amount of thick mucus.  Wheezing.  Rapid breaths (tachypnea).  Gray or bluish discoloration (cyanosis) of the skin, especially in your fingers, toes, or lips.  Fatigue.  Weight loss.  Frequent infections or episodes when breathing symptoms become much worse (exacerbations).  Chest tightness. DIAGNOSIS Your health care provider will take a medical history and perform a physical examination to diagnose COPD. Additional tests for COPD may include:  Lung (pulmonary) function tests.  Chest X-ray.  CT scan.  Blood tests. TREATMENT  Treatment for COPD may include:  Inhaler and nebulizer medicines. These help manage the symptoms of COPD and make your breathing more comfortable.  Supplemental oxygen. Supplemental oxygen is only helpful if you have a low oxygen level in your blood.  Exercise and physical activity. These are beneficial for nearly all people with COPD.  Lung surgery or transplant.  Nutrition therapy to gain weight, if you are underweight.  Pulmonary rehabilitation. This may involve working with a team of health care providers and specialists, such as respiratory, occupational, and physical therapists. HOME CARE INSTRUCTIONS  Take all medicines  (inhaled or pills) as directed by your health care provider.  Avoid over-the-counter medicines or cough syrups that dry up your airway (such as antihistamines) and slow down the elimination of secretions unless instructed otherwise by your health care provider.  If you are a smoker, the most important thing that you can do is stop smoking. Continuing to smoke will cause further lung damage and breathing trouble. Ask your health care provider for help with quitting smoking. He or she can direct you to community resources or hospitals that provide support.  Avoid exposure to irritants such as smoke, chemicals, and fumes that aggravate your breathing.  Use oxygen therapy and pulmonary rehabilitation if directed by your health care provider. If you require home oxygen therapy, ask your health care provider whether you should purchase a pulse oximeter to measure your oxygen level at home.  Avoid contact with individuals who have a contagious illness.  Avoid extreme temperature and humidity changes.  Eat healthy foods. Eating smaller, more frequent meals and resting before meals may help you maintain your strength.  Stay active, but balance activity with periods of rest. Exercise and physical activity will help you maintain your ability to do things you want to do.  Preventing infection and hospitalization is very important when you have COPD. Make sure to receive all the vaccines your health care provider recommends, especially the pneumococcal and influenza vaccines. Ask your health care provider whether you need a pneumonia vaccine.  Learn and use relaxation techniques to manage stress.  Learn and use controlled breathing techniques as directed by your health care provider. Controlled breathing techniques include:  Pursed lip breathing. Start by breathing in (inhaling) through your nose for 1 second. Then, purse your lips as if you were   going to whistle and breathe out (exhale) through the  pursed lips for 2 seconds.  Diaphragmatic breathing. Start by putting one hand on your abdomen just above your waist. Inhale slowly through your nose. The hand on your abdomen should move out. Then purse your lips and exhale slowly. You should be able to feel the hand on your abdomen moving in as you exhale.  Learn and use controlled coughing to clear mucus from your lungs. Controlled coughing is a series of short, progressive coughs. The steps of controlled coughing are: 1. Lean your head slightly forward. 2. Breathe in deeply using diaphragmatic breathing. 3. Try to hold your breath for 3 seconds. 4. Keep your mouth slightly open while coughing twice. 5. Spit any mucus out into a tissue. 6. Rest and repeat the steps once or twice as needed. SEEK MEDICAL CARE IF:  You are coughing up more mucus than usual.  There is a change in the color or thickness of your mucus.  Your breathing is more labored than usual.  Your breathing is faster than usual. SEEK IMMEDIATE MEDICAL CARE IF:  You have shortness of breath while you are resting.  You have shortness of breath that prevents you from:  Being able to talk.  Performing your usual physical activities.  You have chest pain lasting longer than 5 minutes.  Your skin color is more cyanotic than usual.  You measure low oxygen saturations for longer than 5 minutes with a pulse oximeter. MAKE SURE YOU:  Understand these instructions.  Will watch your condition.  Will get help right away if you are not doing well or get worse.   This information is not intended to replace advice given to you by your health care provider. Make sure you discuss any questions you have with your health care provider.   Document Released: 03/08/2005 Document Revised: 06/19/2014 Document Reviewed: 01/23/2013 Elsevier Interactive Patient Education 2016 Elsevier [employer] Acute Bronchitis Bronchitis is inflammation of the airways that extend from the windpipe  into the lungs (bronchi). The inflammation often causes mucus to develop. This leads to a cough, which is the most common symptom of bronchitis.  In acute bronchitis, the condition usually develops suddenly and goes away over time, usually in a couple weeks. Smoking, allergies, and asthma can make bronchitis worse. Repeated episodes of bronchitis may cause further lung problems.  CAUSES Acute bronchitis is most often caused by the same virus that causes a cold. The virus can spread from person to person (contagious) through coughing, sneezing, and touching contaminated objects. SIGNS AND SYMPTOMS   Cough.   Fever.   Coughing up mucus.   Body aches.   Chest congestion.   Chills.   Shortness of breath.   Sore throat.  DIAGNOSIS  Acute bronchitis is usually diagnosed through a physical exam. Your health care provider will also ask you questions about your medical history. Tests, such as chest X-rays, are sometimes done to rule out other conditions.  TREATMENT  Acute bronchitis usually goes away in a couple weeks. Oftentimes, no medical treatment is necessary. Medicines are sometimes given for relief of fever or cough. Antibiotic medicines are usually not needed but may be prescribed in certain situations. In some cases, an inhaler may be recommended to help reduce shortness of breath and control the cough. A cool mist vaporizer may also be used to help thin bronchial secretions and make it easier to clear the chest.  HOME CARE INSTRUCTIONS  Get plenty of rest.   Drink  enough fluids to keep your urine clear or pale yellow (unless you have a medical condition that requires fluid restriction). Increasing fluids may help thin your respiratory secretions (sputum) and reduce chest congestion, and it will prevent dehydration.   Take medicines only as directed by your health care provider.  If you were prescribed an antibiotic medicine, finish it all even if you start to feel  better.  Avoid smoking and secondhand smoke. Exposure to cigarette smoke or irritating chemicals will make bronchitis worse. If you are a smoker, consider using nicotine gum or skin patches to help control withdrawal symptoms. Quitting smoking will help your lungs heal faster.   Reduce the chances of another bout of acute bronchitis by washing your hands frequently, avoiding people with cold symptoms, and trying not to touch your hands to your mouth, nose, or eyes.   Keep all follow-up visits as directed by your health care provider.  SEEK MEDICAL CARE IF: Your symptoms do not improve after 1 week of treatment.  SEEK IMMEDIATE MEDICAL CARE IF:  You develop an increased fever or chills.   You have chest pain.   You have severe shortness of breath.  You have bloody sputum.   You develop dehydration.  You faint or repeatedly feel like you are going to pass out.  You develop repeated vomiting.  You develop a severe headache. MAKE SURE YOU:   Understand these instructions.  Will watch your condition.  Will get help right away if you are not doing well or get worse.   This information is not intended to replace advice given to you by your health care provider. Make sure you discuss any questions you have with your health care provider.   Document Released: 07/06/2004 Document Revised: 06/19/2014 Document Reviewed: 11/19/2012 Elsevier Interactive Patient Education 2016 [employer]. Smoking Cessation, Tips for Success If you are ready to quit smoking, congratulations! You have chosen to help yourself be healthier. Cigarettes bring nicotine, tar, carbon monoxide, and other irritants into your body. Your lungs, heart, and blood vessels will be able to work better without these poisons. There are many different ways to quit smoking. Nicotine gum, nicotine patches, a nicotine inhaler, or nicotine nasal spray can help with physical craving. Hypnosis, support groups, and medicines  help break the habit of smoking. WHAT THINGS CAN I DO TO MAKE QUITTING EASIER?  Here are some tips to help you quit for good:  Pick a date when you will quit smoking completely. Tell all of your friends and family about your plan to quit on that date.  Do not try to slowly cut down on the number of cigarettes you are smoking. Pick a quit date and quit smoking completely starting on that day.  Throw away all cigarettes.   Clean and remove all ashtrays from your home, work, and car.  On a card, write down your reasons for quitting. Carry the card with you and read it when you get the urge to smoke.  Cleanse your body of nicotine. Drink enough water and fluids to keep your urine clear or pale yellow. Do this after quitting to flush the nicotine from your body.  Learn to predict your moods. Do not let a bad situation be your excuse to have a cigarette. Some situations in your life might tempt you into wanting a cigarette.  Never have "just one" cigarette. It leads to wanting another and another. Remind yourself of your decision to quit.  Change habits associated with smoking. If  you smoked while driving or when feeling stressed, try other activities to replace smoking. Stand up when drinking your coffee. Brush your teeth after eating. Sit in a different chair when you read the paper. Avoid alcohol while trying to quit, and try to drink fewer caffeinated beverages. Alcohol and caffeine may urge you to smoke.  Avoid foods and drinks that can trigger a desire to smoke, such as sugary or spicy foods and alcohol.  Ask people who smoke not to smoke around you.  Have something planned to do right after eating or having a cup of coffee. For example, plan to take a walk or exercise.  Try a relaxation exercise to calm you down and decrease your stress. Remember, you may be tense and nervous for the first 2 weeks after you quit, but this will pass.  Find new activities to keep your hands busy. Play  with a pen, coin, or rubber band. Doodle or draw things on paper.  Brush your teeth right after eating. This will help cut down on the craving for the taste of tobacco after meals. You can also try mouthwash.   Use oral substitutes in place of cigarettes. Try using lemon drops, carrots, cinnamon sticks, or chewing gum. Keep them handy so they are available when you have the urge to smoke.  When you have the urge to smoke, try deep breathing.  Designate your home as a nonsmoking area.  If you are a heavy smoker, ask your health care provider about a prescription for nicotine chewing gum. It can ease your withdrawal from nicotine.  Reward yourself. Set aside the cigarette money you save and buy yourself something nice.  Look for support from others. Join a support group or smoking cessation program. Ask someone at home or at work to help you with your plan to quit smoking.  Always ask yourself, "Do I need this cigarette or is this just a reflex?" Tell yourself, "Today, I choose not to smoke," or "I do not want to smoke." You are reminding yourself of your decision to quit.  Do not replace cigarette smoking with electronic cigarettes (commonly called e-cigarettes). The safety of e-cigarettes is unknown, and some may contain harmful chemicals.  If you relapse, do not give up! Plan ahead and think about what you will do the next time you get the urge to smoke. HOW WILL I FEEL WHEN I QUIT SMOKING? You may have symptoms of withdrawal because your body is used to nicotine (the addictive substance in cigarettes). You may crave cigarettes, be irritable, feel very hungry, cough often, get headaches, or have difficulty concentrating. The withdrawal symptoms are only temporary. They are strongest when you first quit but will go away within 10-14 days. When withdrawal symptoms occur, stay in control. Think about your reasons for quitting. Remind yourself that these are signs that your body is healing and  getting used to being without cigarettes. Remember that withdrawal symptoms are easier to treat than the major diseases that smoking can cause.  Even after the withdrawal is over, expect periodic urges to smoke. However, these cravings are generally short lived and will go away whether you smoke or not. Do not smoke! WHAT RESOURCES ARE AVAILABLE TO HELP ME QUIT SMOKING? Your health care provider can direct you to community resources or hospitals for support, which may include:  Group support.  Education.  Hypnosis.  Therapy.   This information is not intended to replace advice given to you by your health care provider.  Make sure you discuss any questions you have with your health care provider.   Document Released: 02/25/2004 Document Revised: 06/19/2014 Document Reviewed: 11/14/2012 Elsevier Interactive Patient Education [employer].

## 2015-04-01 ENCOUNTER — Telehealth: Payer: Self-pay

## 2015-04-01 DIAGNOSIS — J200 Acute bronchitis due to Mycoplasma pneumoniae: Secondary | ICD-10-CM

## 2015-04-01 DIAGNOSIS — F17200 Nicotine dependence, unspecified, uncomplicated: Secondary | ICD-10-CM

## 2015-04-01 DIAGNOSIS — J9801 Acute bronchospasm: Secondary | ICD-10-CM

## 2015-04-01 MED ORDER — HYDROCODONE-ACETAMINOPHEN 7.5-325 MG/15ML PO SOLN: 5.0000 mL | Freq: Four times a day (QID) | ORAL | Status: DC | PRN

## 2015-04-01 NOTE — Telephone Encounter (Signed)
Patient went to pick up her prescription written yesterday for HYDROcodone-homatropine (HYCODAN) 5-1.5 MG/5ML syrup It was not at the pharmacy.   Please call it in.    [phone]

## 2015-04-01 NOTE — Telephone Encounter (Signed)
Can we reprint? She states she did not have a Rx. She looked everywhere.

## 2015-04-01 NOTE — Telephone Encounter (Signed)
Refilled - printed. [place] database shows she has not filled other rx.

## 2015-04-02 NOTE — Telephone Encounter (Signed)
Rx placed in pick up draw.

## 2015-04-02 NOTE — Telephone Encounter (Signed)
Pt.notified

## 2015-04-20 ENCOUNTER — Telehealth: Payer: Self-pay

## 2015-04-20 DIAGNOSIS — J449 Chronic obstructive pulmonary disease, unspecified: Secondary | ICD-10-CM

## 2015-04-20 NOTE — Telephone Encounter (Signed)
Pt needs a referral to pulmonology for COPD please.  Dr. Lamonte Sakai at [facility]

## 2015-04-21 NOTE — Telephone Encounter (Signed)
Per Dr. Elder Cyphers, ok to do. Referral placed.

## 2015-05-07 ENCOUNTER — Other Ambulatory Visit: Payer: Self-pay | Admitting: Internal Medicine

## 2015-05-09 NOTE — Telephone Encounter (Signed)
LM   Who fills her Ambien?  Does not look like we have every filled here.  Put on hold

## 2015-05-19 ENCOUNTER — Encounter: Payer: Self-pay | Admitting: Emergency Medicine

## 2015-05-19 ENCOUNTER — Ambulatory Visit (INDEPENDENT_AMBULATORY_CARE_PROVIDER_SITE_OTHER): Payer: Self-pay | Admitting: Emergency Medicine

## 2015-05-19 VITALS — BP 124/82 | HR 62 | Ht 63.0 in | Wt 110.0 lb

## 2015-05-19 DIAGNOSIS — R079 Chest pain, unspecified: Secondary | ICD-10-CM

## 2015-05-19 DIAGNOSIS — R0600 Dyspnea, unspecified: Secondary | ICD-10-CM | POA: Insufficient documentation

## 2015-05-19 DIAGNOSIS — R053 Chronic cough: Secondary | ICD-10-CM | POA: Insufficient documentation

## 2015-05-19 DIAGNOSIS — R05 Cough: Secondary | ICD-10-CM

## 2015-05-19 MED ORDER — FLUTICASONE PROPIONATE 50 MCG/ACT NA SUSP: 2.0000 | Freq: Every day | NASAL | Status: DC

## 2015-05-19 NOTE — Assessment & Plan Note (Signed)
etiology unclear, suspect related to her coexisting  Dyspnea. Probably some contribution of typical factors including GERD and allergic rhinitis. Must also consider other potential etiologies including hypersensitivity pneumonitis given her mold exposure. She is a smoker and is at risk for malignancy. Needs imaging, PFT

## 2015-05-19 NOTE — Patient Instructions (Addendum)
We will perform full Pulmonary function testing.  We will perform a Ct scan of your chest  Please continue your protonix 40mg  twice a day. Take this medication either 1 hour before or 1 hour after eating Please start fluticasone nasal spray 2 sprays each nostril daily Please start loratadine 10mg  daily Follow with Dr Lamonte Sakai next available to review your studies.

## 2015-05-19 NOTE — Progress Notes (Signed)
Subjective:    Patient ID: Michaela Hall, female    DOB: [birth year], 59 y.o.   MRN: [id number]   HPI 59 yo woman, former smoker (15 pk-yrs) with hx chronic pain, HA, DJD, anxiety/depression. She is a self-referral for evaluation of dyspnea and cough. She reports that she had been well until about Fall '15. At that time she developed dry cough, especially at night. Had some nasal drainage, voice changes. She has never really been completely better since. She has been treated on a few occasions with pred and abx without any change. She saw ENT in July, was started on protonix bid. She has am nausea, vomiting. Sweats at night. Also on gabapentin, but not for cough, for pain. She has intermittent acute dyspnea. Has not been at baseline breathing status since last year. She has a mold exposure in her home up until July when she moved elsewhere.    Review of Systems  Constitutional: Positive for appetite change and unexpected weight change. Negative for fever.  HENT: Positive for dental problem. Negative for congestion, ear pain, nosebleeds, postnasal drip, rhinorrhea, sinus pressure, sneezing, sore throat and trouble swallowing.   Eyes: Negative for redness and itching.  Respiratory: Positive for cough and shortness of breath. Negative for chest tightness and wheezing.   Cardiovascular: Negative for palpitations and leg swelling.  Gastrointestinal: Negative for nausea and vomiting.       Acid Heartburn  Genitourinary: Negative for dysuria.  Musculoskeletal: Negative for joint swelling.  Skin: Negative for rash.  Neurological: Negative for headaches.  Hematological: Does not bruise/bleed easily.  Psychiatric/Behavioral: Negative for dysphoric mood. The patient is not nervous/anxious.     Past Medical History  Diagnosis Date  . Chronic pain     back and right hip  . Complication of anesthesia   . PONV (postoperative nausea and vomiting)   . Headache(784.0)   . DJD (degenerative joint  disease)   . Depression   . Anxiety      Family History  Problem Relation Age of Onset  . Anesthesia problems Mother   . Cancer Mother     ovary/uterus & breast  . Hyperlipidemia Mother   . Hypertension Mother   . Breast cancer Maternal Aunt   . Ovarian cancer Maternal Grandmother   .     Marland Kitchen Hyperlipidemia Father   . Hypertension Father      Social History   Social History  . Marital Status: Married    Spouse Name: N/A  . Number of Children: 3  . Years of Education: N/A   Occupational History  . Medicare ins. sales [employer] History Main Topics  . Smoking status: Former Smoker -- 0.50 packs/day for 20 years    Types: Cigarettes    Quit date: 06/12/2013  . Smokeless tobacco: Never Used  . Alcohol Use: No  . Drug Use: No  . Sexual Activity: Not on file   Other Topics Concern  . Not on file   Social History Narrative   Last colon screening: 09/2011     No Known Allergies   Outpatient Prescriptions Prior to Visit  Medication Sig Dispense Refill  . albuterol (PROVENTIL HFA;VENTOLIN HFA) 108 (90 BASE) MCG/ACT inhaler Inhale 2 puffs into the lungs every 6 (six) hours as needed for wheezing or shortness of breath (cough, shortness of breath or wheezing.). 1 Inhaler 6  . GABAPENTIN PO Take 100 mg by mouth 3 (three) times daily.     . Ondansetron  HCl (ZOFRAN PO) Take 8 mg by mouth 2 (two) times daily.     . Oxycodone HCl 10 MG TABS Take 10 mg by mouth 2 (two) times daily as needed.  0  . tiZANidine (ZANAFLEX) 4 MG tablet Take 4 mg by mouth every 6 (six) hours as needed.   2  . albuterol (PROVENTIL HFA;VENTOLIN HFA) 108 (90 BASE) MCG/ACT inhaler Inhale 2 puffs into the lungs every 6 (six) hours as needed for wheezing or shortness of breath. 1 Inhaler 3  . azithromycin (ZITHROMAX) 500 MG tablet Take 1 tablet (500 mg total) by mouth daily. 5 tablet 0  . HYDROcodone-acetaminophen (HYCET) 7.5-325 mg/15 ml solution Take 5 mLs by mouth every 6 (six) hours as  needed (or cough). 240 mL 0  . predniSONE (DELTASONE) 10 MG tablet 6-5-4-3-2-1 po pc for breathing 21 tablet 0   No facility-administered medications prior to visit.         Objective:   Physical Exam  Filed Vitals:   05/19/15 1531  BP: 124/82  Pulse: 62  Height: 5\' 3"  (1.6 m)  Weight: 110 lb (49.896 kg)  SpO2: 97%  Gen: Pleasant, thin woman, in no distress,  normal affect  ENT: No lesions,  mouth clear,  oropharynx clear, no postnasal drip  Neck: No JVD, no TMG, no carotid bruits  Lungs: No use of accessory muscles,  clear without rales or rhonchi  Cardiovascular: RRR, heart sounds normal, no murmur or gallops, no peripheral edema  Musculoskeletal: No deformities, no cyanosis or clubbing  Neuro: alert, non focal  Skin: Warm, no lesions or rashes      Assessment & Plan:  Chronic cough etiology unclear, suspect related to her coexisting  Dyspnea. Probably some contribution of typical factors including GERD and allergic rhinitis. Must also consider other potential etiologies including hypersensitivity pneumonitis given her mold exposure. She is a smoker and is at risk for malignancy. Needs imaging, PFT  Dyspnea DDx and evaluation to mirror that of her chronic cough for now.

## 2015-05-19 NOTE — Assessment & Plan Note (Signed)
DDx and evaluation to mirror that of her chronic cough for now.

## 2015-05-26 ENCOUNTER — Telehealth: Payer: Self-pay | Admitting: Emergency Medicine

## 2015-05-26 NOTE — Telephone Encounter (Signed)
Auth# place in appt [id number] Joellen [place]

## 2015-05-27 ENCOUNTER — Other Ambulatory Visit: Payer: Self-pay

## 2015-05-31 ENCOUNTER — Ambulatory Visit
Admission: RE | Admit: 2015-05-31 | Discharge: 2015-05-31 | Disposition: A | Discharge status: Home / Self Care | Payer: 59 | Source: Ambulatory Visit | Attending: Emergency Medicine | Admitting: Emergency Medicine

## 2015-05-31 DIAGNOSIS — R079 Chest pain, unspecified: Secondary | ICD-10-CM

## 2015-06-01 ENCOUNTER — Telehealth: Payer: Self-pay | Admitting: Emergency Medicine

## 2015-06-01 NOTE — Telephone Encounter (Signed)
Please tell the patient that her CT scan of the chest shows normal lungs, no evidence of blood clots or other abnormality to explain her symptoms. There are two very small pulmonary nodules that are seen commonly and are likely insignificant. We will discuss any follow up scans to insure that these nodules are not changing when I see her in the office.

## 2015-06-01 NOTE — Telephone Encounter (Signed)
Called spoke with pt. Made aware RB in office this afternoon. Pt is requesting CT results ASAP. Please advise thanks

## 2015-06-02 NOTE — Telephone Encounter (Signed)
lmtcb x1 for pt. 

## 2015-06-03 NOTE — Telephone Encounter (Signed)
[phone] calling back

## 2015-06-03 NOTE — Telephone Encounter (Signed)
Patient notified of CT results.   Patient wanted to know if the CT scan shows her Trachea.  She said that she is still coughing a lot and wants to know what is causing the cough.

## 2015-06-03 NOTE — Telephone Encounter (Signed)
lmtcb X2 for pt.  

## 2015-06-04 NOTE — Telephone Encounter (Signed)
Patient requesting more information regarding cause of cough.   She wants to know if the CT scan shows her Trachea. Sent message to [place], pt aware that RB is out of office and says she is okay waiting until her next OV to discuss with RB after getting PFT on 07/23/15. Nothing further needed.

## 2015-07-23 ENCOUNTER — Encounter: Payer: Self-pay | Admitting: Emergency Medicine

## 2015-07-23 ENCOUNTER — Ambulatory Visit (INDEPENDENT_AMBULATORY_CARE_PROVIDER_SITE_OTHER): Payer: 59 | Admitting: Emergency Medicine

## 2015-07-23 ENCOUNTER — Encounter (INDEPENDENT_AMBULATORY_CARE_PROVIDER_SITE_OTHER): Payer: 59 | Admitting: Emergency Medicine

## 2015-07-23 VITALS — BP 114/70 | HR 76 | Wt 113.0 lb

## 2015-07-23 DIAGNOSIS — R918 Other nonspecific abnormal finding of lung field: Secondary | ICD-10-CM | POA: Diagnosis not present

## 2015-07-23 DIAGNOSIS — R053 Chronic cough: Secondary | ICD-10-CM

## 2015-07-23 DIAGNOSIS — R0600 Dyspnea, unspecified: Secondary | ICD-10-CM

## 2015-07-23 DIAGNOSIS — R05 Cough: Secondary | ICD-10-CM | POA: Diagnosis not present

## 2015-07-23 DIAGNOSIS — R079 Chest pain, unspecified: Secondary | ICD-10-CM | POA: Diagnosis not present

## 2015-07-23 LAB — PULMONARY FUNCTION TEST
DL/VA % PRED: 82 %
DL/VA: 3.81 ml/min/mmHg/L
DLCO unc % pred: 82 %
DLCO unc: 18.28 ml/min/mmHg
FEF 25-75 POST: 2.51 L/s
FEF 25-75 Pre: 2.25 L/sec
FEF2575-%Change-Post: 11 %
FEF2575-%PRED-POST: 110 %
FEF2575-%Pred-Pre: 98 %
FEV1-%CHANGE-POST: 1 %
FEV1-%PRED-PRE: 107 %
FEV1-%Pred-Post: 108 %
FEV1-PRE: 2.6 L
FEV1-Post: 2.64 L
FEV1FVC-%Change-Post: 6 %
FEV1FVC-%PRED-PRE: 100 %
FEV6-%Change-Post: -3 %
FEV6-%Pred-Post: 103 %
FEV6-%Pred-Pre: 107 %
FEV6-POST: 3.12 L
FEV6-Pre: 3.24 L
FEV6FVC-%Change-Post: 0 %
FEV6FVC-%PRED-POST: 102 %
FEV6FVC-%Pred-Pre: 102 %
FVC-%Change-Post: -4 %
FVC-%PRED-POST: 100 %
FVC-%PRED-PRE: 105 %
FVC-POST: 3.15 L
FVC-PRE: 3.3 L
PRE FEV6/FVC RATIO: 98 %
Post FEV1/FVC ratio: 84 %
Post FEV6/FVC ratio: 99 %
Pre FEV1/FVC ratio: 79 %
RV % pred: 66 %
RV: 1.26 L
TLC % PRED: 159 %
TLC: 7.71 L

## 2015-07-23 MED ORDER — HYDROCODONE-HOMATROPINE 5-1.5 MG/5ML PO SYRP: 5.0000 mL | ORAL_SOLUTION | Freq: Four times a day (QID) | ORAL | Status: DC | PRN

## 2015-07-23 NOTE — Progress Notes (Signed)
Subjective:    Patient ID: Michaela Hall, female    DOB: [birth year], 60 y.o.   MRN: [id number]   HPI 60 yo woman, former smoker (15 pk-yrs) with hx chronic pain, HA, DJD, anxiety/depression. She is a self-referral for evaluation of dyspnea and cough. She reports that she had been well until about Fall '15. At that time she developed dry cough, especially at night. Had some nasal drainage, voice changes. She has never really been completely better since. She has been treated on a few occasions with pred and abx without any change. She saw ENT in July, was started on protonix bid. She has am nausea, vomiting. Sweats at night. Also on gabapentin, but not for cough, for pain. She has intermittent acute dyspnea. Has not been at baseline breathing status since last year. She has a mold exposure in her home up until July when she moved elsewhere.   ROV 07/23/15 -- follow-up visit for dyspnea and cough. Her last visit we empirically started Protonix and an allergy regimen (fluticasone and loratadine). Given her history of mold exposure and small tobacco exposure we also formed pulmonary function testing and a CT scan of the chest both of which I have personally reviewed. Chest CT showed two to 3 mm nodules, one in the left upper lobe and another in the anterior right lower lobe that are likely benign but which will need to be followed. Her pulmonary function testing shows normal spirometry and normal diffusion capacity. There is no bronchodilator response. She was unable to do lung volumes effectively and no data was generated. She tells me that her cough is unchanged. She has gastroparesis - has emesis almost every day that is influencing her UA irritation. She does describe some R upper back pain with exertion that corresponds with her dyspnea.    Review of Systems  Constitutional: Positive for appetite change and unexpected weight change. Negative for fever.  HENT: Positive for dental problem. Negative  for congestion, ear pain, nosebleeds, postnasal drip, rhinorrhea, sinus pressure, sneezing, sore throat and trouble swallowing.   Eyes: Negative for redness and itching.  Respiratory: Positive for cough and shortness of breath. Negative for chest tightness and wheezing.   Cardiovascular: Negative for palpitations and leg swelling.  Gastrointestinal: Negative for nausea and vomiting.       Acid Heartburn  Genitourinary: Negative for dysuria.  Musculoskeletal: Negative for joint swelling.  Skin: Negative for rash.  Neurological: Negative for headaches.  Hematological: Does not bruise/bleed easily.  Psychiatric/Behavioral: Negative for dysphoric mood. The patient is not nervous/anxious.     Past Medical History  Diagnosis Date  . Chronic pain     back and right hip  . Complication of anesthesia   . PONV (postoperative nausea and vomiting)   . Headache(784.0)   . DJD (degenerative joint disease)   . Depression   . Anxiety      Family History  Problem Relation Age of Onset  . Anesthesia problems Mother   . Cancer Mother     ovary/uterus & breast  . Hyperlipidemia Mother   . Hypertension Mother   . Breast cancer Maternal Aunt   . Ovarian cancer Maternal Grandmother   .     Marland Kitchen Hyperlipidemia Father   . Hypertension Father      Social History   Social History  . Marital Status: Married    Spouse Name: N/A  . Number of Children: 3  . Years of Education: N/A   Occupational History  .  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 Smoking status: Former Smoker -- 0.50 packs/day for 20 years    Types: Cigarettes    Quit date: 06/12/2013  . Smokeless tobacco: Never Used  . Alcohol Use: No  . Drug Use: No  . Sexual Activity: Not on file   Other Topics Concern  . Not on file   Social History Narrative   Last colon screening: 09/2011     No Known Allergies   Outpatient Prescriptions Prior to Visit  Medication Sig Dispense Refill  . albuterol  (PROVENTIL HFA;VENTOLIN HFA) 108 (90 BASE) MCG/ACT inhaler Inhale 2 puffs into the lungs every 6 (six) hours as needed for wheezing or shortness of breath (cough, shortness of breath or wheezing.). 1 Inhaler 6  . fluticasone (FLONASE) 50 MCG/ACT nasal spray Place 2 sprays into both nostrils daily. 16 g 2  . GABAPENTIN PO Take 100 mg by mouth 3 (three) times daily.     . Ondansetron HCl (ZOFRAN PO) Take 8 mg by mouth 2 (two) times daily.     . Oxycodone HCl 10 MG TABS Take 10 mg by mouth 2 (two) times daily as needed.  0  . pantoprazole (PROTONIX) 40 MG tablet Take 40 mg by mouth 2 (two) times daily.    Marland Kitchen tiZANidine (ZANAFLEX) 4 MG tablet Take 4 mg by mouth every 6 (six) hours as needed.   2   No facility-administered medications prior to visit.         Objective:   Physical Exam  Filed Vitals:   07/23/15 1508 07/23/15 1509  BP:  114/70  Pulse:  76  Weight: 113 lb (51.256 kg)   SpO2:  97%  Gen: Pleasant, thin woman, in no distress,  normal affect  ENT: No lesions,  mouth clear,  oropharynx clear, no postnasal drip  Neck: No JVD, no TMG, no carotid bruits  Lungs: No use of accessory muscles,  clear without rales or rhonchi  Cardiovascular: RRR, heart sounds normal, no murmur or gallops, no peripheral edema  Musculoskeletal: No deformities, no cyanosis or clubbing  Neuro: alert, non focal  Skin: Warm, no lesions or rashes      Assessment & Plan:  Dyspnea Based on her pulmonary function testing and her CT scan of the chest I strongly suspect that her dyspnea is due to deconditioning. She does tell me that she has consistent right mid back discomfort with exertion. For this reason I will refer her to cardiology to have risk stratification for possible coronary disease. I suspect that this will be reassuring and that she can start a regular exercise routine and hopefully build up her endurance and functional capacity.   Chronic cough She hasn't responded in any way to allergy  regimen or to the PPI. I suspect that her frequent emesis due to gastroparesis is irritating her upper airway despite his medications. We will stop them at this time. I believe she needs to follow with gastroenterology to hopefully decrease the frequency of her emesis which at this time is at least daily.   Pulmonary nodules Bilateral lower lobe small nodules that are likely benign but which will need to be followed in 12 months. Repeat her CT in December

## 2015-07-23 NOTE — Assessment & Plan Note (Addendum)
Based on her pulmonary function testing and her CT scan of the chest I strongly suspect that her dyspnea is due to deconditioning. She does tell me that she has consistent right mid back discomfort with exertion. For this reason I will refer her to cardiology to have risk stratification for possible coronary disease. I suspect that this will be reassuring and that she can start a regular exercise routine and hopefully build up her endurance and functional capacity.

## 2015-07-23 NOTE — Progress Notes (Signed)
PFT done today. 07/23/2015

## 2015-07-23 NOTE — Addendum Note (Signed)
Addended by: Desmond Dike C on: 07/23/2015 03:44 PM   Modules accepted: Orders

## 2015-07-23 NOTE — Patient Instructions (Addendum)
Your pulmonary function testing and CT scan of the chest are both reassuring. The do not explain the reason for your shortness of breath Believe that her chronic cough is being caused by your gastroparesis and your recurrent emesis. In order to make this better you will need to make to gastroparesis and vomiting better.  Stop your fluticasone nasal spray, loratadine, Protonix We will refer you to see cardiology to evaluate your shortness of breath and the associated right mid back pain If you're cardiology evaluation is reassuring that I believe you can safely start an exercise program to try and rebuild your stamina and exercise tolerance We will repeat your CT scan of the chest without contrast in 1 year to follow your small pulmonary nodules.  Follow with Michaela Hall in 12 months to discuss the CT scan results

## 2015-07-23 NOTE — Assessment & Plan Note (Signed)
She hasn't responded in any way to allergy regimen or to the PPI. I suspect that her frequent emesis due to gastroparesis is irritating her upper airway despite his medications. We will stop them at this time. I believe she needs to follow with gastroenterology to hopefully decrease the frequency of her emesis which at this time is at least daily.

## 2015-07-23 NOTE — Assessment & Plan Note (Signed)
Bilateral lower lobe small nodules that are likely benign but which will need to be followed in 12 months. Repeat her CT in December

## 2015-08-04 ENCOUNTER — Encounter: Payer: Self-pay | Admitting: Cardiovascular Disease

## 2015-08-04 ENCOUNTER — Ambulatory Visit (INDEPENDENT_AMBULATORY_CARE_PROVIDER_SITE_OTHER): Payer: 59 | Admitting: Cardiovascular Disease

## 2015-08-04 VITALS — BP 112/70 | HR 82 | Ht 63.0 in | Wt 109.1 lb

## 2015-08-04 DIAGNOSIS — Z13220 Encounter for screening for lipoid disorders: Secondary | ICD-10-CM

## 2015-08-04 DIAGNOSIS — Z87891 Personal history of nicotine dependence: Secondary | ICD-10-CM

## 2015-08-04 DIAGNOSIS — R053 Chronic cough: Secondary | ICD-10-CM

## 2015-08-04 DIAGNOSIS — Z79899 Other long term (current) drug therapy: Secondary | ICD-10-CM

## 2015-08-04 DIAGNOSIS — R0789 Other chest pain: Secondary | ICD-10-CM

## 2015-08-04 DIAGNOSIS — R0609 Other forms of dyspnea: Secondary | ICD-10-CM

## 2015-08-04 DIAGNOSIS — R0600 Dyspnea, unspecified: Secondary | ICD-10-CM

## 2015-08-04 DIAGNOSIS — R05 Cough: Secondary | ICD-10-CM

## 2015-08-04 DIAGNOSIS — E0590 Thyrotoxicosis, unspecified without thyrotoxic crisis or storm: Secondary | ICD-10-CM

## 2015-08-04 DIAGNOSIS — R918 Other nonspecific abnormal finding of lung field: Secondary | ICD-10-CM

## 2015-08-04 NOTE — Patient Instructions (Signed)
Your physician recommends that you return for lab work fasting.  Your physician has requested that you have a lexiscan myoview. For further information please visit [email or web address]. Please follow instruction sheet, as given.  Your physician has requested that you have an echocardiogram. Echocardiography is a painless test that uses sound waves to create images of your heart. It provides your doctor with information about the size and shape of your heart and how well your heart's chambers and valves are working. This procedure takes approximately one hour. There are no restrictions for this procedure.   Your physician recommends that you schedule a follow-up appointment in: 4-6 weeks with dr  Claiborne Billings

## 2015-08-04 NOTE — Progress Notes (Signed)
Patient ID: CALEIGH RABELO, female   DOB: 27-Dec-1955, 60 y.o.   MRN: [id number]     Primary MD: Tami Lin Pulmonary MD: Dr. Izell Shadyside   PATIENT PROFILE: KEWANDA POLAND is a 60 y.o. female  Who is referred through the courtesy of Dr. Lamonte Sakai for evaluation of significant dyspnea as well as right mid back discomfort.   HPI:  Jamye Balicki Harmsen denies any known history of prior cardiac disease. Dating back to 1990 she had undergone numerous surgeries including an L4-L5 laminectomy, left knee reconstructive surgery, hip surgery,  and significant gastroparesis. She is remote tobacco user had smoked for a approximatelly 20 years, one half to 1 pack per day per she quit 2 years ago. She tells me that last year she was bedridden for 7 months.  Since that time, she has been markedly deconditioned and note shortness of breath with minimal activity.  She has been living with her sister since her house was found to have significant black mold which and is currently under renovation.  She has noticed some episodes of chest pressure associated with her increasing shortness of breath.  She has been evaluated by Dr. Malvin Johns.  A chest CT showed 2-3 mm nodules, one in the left upper lobe and another in the anterior right lower lobe that were felt most likely to be benign but will require follow-up.  On primary function testing.  She had normal spirometry and normal diffusion capacity.  There was no bronchodilator response.  She was unable to have lung volumes obtained.  She had had difficulty with her gastroparesis with frequent vomiting.  Because of her debilitated state and inability to really do any activity, she is now referred for cardiology evaluation.  She has had a chronic cough which had not responded to PPI or allergy regimens.  Past Medical History  Diagnosis Date  . Chronic pain     back and right hip  . Complication of anesthesia   . PONV (postoperative nausea and vomiting)   . Headache(784.0)     . DJD (degenerative joint disease)   . Depression   . Anxiety     Past Surgical History  Procedure Laterality Date  . Back surgery    . Hip surgery    . Tonsillectomy    . Knee surgery    . Esophagogastroduodenoscopy  09/28/2011    Procedure: ESOPHAGOGASTRODUODENOSCOPY (EGD);  Surgeon: Winfield Cunas., MD;  Location: [facility];  Service: Endoscopy;  Laterality: N/A;  . Spine surgery      No Known Allergies  Current Outpatient Prescriptions  Medication Sig Dispense Refill  . GABAPENTIN PO Take 100 mg by mouth 3 (three) times daily.     Marland Kitchen HYDROcodone-homatropine (HYCODAN) 5-1.5 MG/5ML syrup Take 5 mLs by mouth every 6 (six) hours as needed for cough. 240 mL 0  . ondansetron (ZOFRAN-ODT) 8 MG disintegrating tablet Take 1 tablet by mouth 3 (three) times daily. Take 1 tab by mouth tid    . Ondansetron HCl (ZOFRAN PO) Take 8 mg by mouth 2 (two) times daily.     . Oxycodone HCl 10 MG TABS Take 10 mg by mouth 2 (two) times daily as needed.  0  . progesterone (PROMETRIUM) 100 MG capsule Take 1 capsule by mouth at bedtime. Take 1 cap by mouth daily at bedtime  11  . tiZANidine (ZANAFLEX) 4 MG tablet Take 4 mg by mouth every 6 (six) hours as needed.   2  . VIVELLE-DOT 0.05  MG/24HR patch Place 1 patch onto the skin 2 (two) times a week. Place 1 patch onto the skin 2 (two) times a week.  3   No current facility-administered medications for this visit.    Social History   Social History  . Marital Status: Married    Spouse Name: N/A  . Number of Children: 3  . Years of Education: N/A   Occupational History  . 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 Smoking status: Former Smoker -- 0.50 packs/day for 20 years    Types: Cigarettes    Quit date: 06/12/2013  . Smokeless tobacco: Never Used  . Alcohol Use: No  . Drug Use: No  . Sexual Activity: Not on file   Other Topics Concern  . Not on file   Social History Narrative   Last colon  screening: 09/2011   Social history is notable in that she is married for 25 years.  She has 3 children 6 grandchildren.  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  Family History  Problem Relation Age of Onset  . Anesthesia problems Mother   . Cancer Mother     ovary/uterus & breast  . Hyperlipidemia Mother   . Hypertension Mother   . Breast cancer Maternal Aunt   . Ovarian cancer Maternal Grandmother   .     Marland Kitchen Hyperlipidemia Father   . Hypertension Father    Additional family history is notable in that her mother died at 33 with a subarachnoid hemorrhage.  Father died at 64 with a brain tumor. There is heart disease and stroke as well as cervical cancer in her grandparents. She has 2 sisters who are alive and well.  She has 3 children , ages 53, 27, and 53.  ROS General: Negative; No fevers, chills, or night sweats HEENT: Negative; No changes in vision or hearing, sinus congestion, difficulty swallowing Pulmonary:  Positive for cough and significant shortness of breath with activity Cardiovascular:  See HPI;  GI: Negative; No nausea, vomiting, diarrhea, or abdominal pain GU: Negative; No dysuria, hematuria, or difficulty voiding Musculoskeletal: Negative; no myalgias, joint pain, or weakness Hematologic/Oncologic: Negative; no easy bruising, bleeding Endocrine: Negative; no heat/cold intolerance; no diabetes Neuro: Negative; no changes in balance, headaches Skin: Negative; No rashes or skin lesions Psychiatric: Negative; No behavioral problems, depression Sleep: Negative; No daytime sleepiness, hypersomnolence, bruxism, restless legs, hypnogagnic hallucinations Other comprehensive 14 point system review is negative   Physical Exam BP 112/70 mmHg  Pulse 82  Ht '5\' 3"'  (1.6 m)  Wt 109 lb 1 oz (49.47 kg)  BMI 19.32 kg/m2  Wt Readings from Last 3 Encounters:  08/04/15 109 lb 1 oz (49.47 kg)  07/23/15 113 lb (51.256  kg)  05/19/15 110 lb (49.896 kg)   General: Alert, oriented, no distress.   Skin: normal turgor, no rashes, warm and dry;  Increased facial wrinkles HEENT: Normocephalic, atraumatic. Pupils equal round and reactive to light; sclera anicteric; extraocular muscles intact; Fundi  Without hemorrhages or exudates Nose without nasal septal hypertrophy Mouth/Parynx benign; Mallinpatti scale Neck: No JVD, no carotid bruits; normal carotid upstroke Lungs: clear to ausculatation and percussion; no wheezing or rales Chest wall: without tenderness to palpitation Heart: PMI not displaced, RRR, s1 s2 normal, 1/6 systolic murmur, no diastolic murmur, no rubs, gallops, thrills, or heaves Abdomen: soft, nontender; no hepatosplenomehaly, BS+; abdominal aorta nontender and not dilated by  palpation. Back: no CVA tenderness Pulses 2+ Musculoskeletal: full range of motion, normal strength, no joint deformities Extremities: no clubbing cyanosis or edema, Homan's sign negative  Neurologic: grossly nonfocal; Cranial nerves grossly wnl Psychologic: Normal mood and affect   ECG (independently read by me):  Normal sinus rhythm at 83 bpm. Right axis deviation.  No significant ST-T changes.  LABS:  BMP Latest Ref Rng 12/25/2014 07/03/2013 10/04/2011  Glucose 70 - 99 mg/dL 101(H) 110(H) 85  BUN 6 - 23 mg/dL '18 20 14  ' Creatinine 0.50 - 1.10 mg/dL 0.88 0.90 0.6  Sodium 135 - 145 mEq/L 141 142 141  Potassium 3.5 - 5.3 mEq/L 4.4 4.7 4.2  Chloride 96 - 112 mEq/L 105 100 104  CO2 19 - 32 mEq/L '28 30 30  ' Calcium 8.4 - 10.5 mg/dL 9.6 9.8 9.6     Hepatic Function Latest Ref Rng 12/25/2014 07/03/2013 10/04/2011  Total Protein 6.0 - 8.3 g/dL 6.6 6.8 6.4  Albumin 3.5 - 5.2 g/dL 4.3 3.9 3.7  AST 0 - 37 U/L '20 15 24  ' ALT 0 - 35 U/L '12 7 21  ' Alk Phosphatase 39 - 117 U/L 70 72 70  Total Bilirubin 0.2 - 1.2 mg/dL 0.8 0.3 0.4    CBC Latest Ref Rng 12/25/2014 10/28/2014 08/12/2013  WBC 4.0 - 10.5 K/uL 8.3 8.1 7.7  Hemoglobin  12.0 - 15.0 g/dL 16.4(H) 16.0 14.7  Hematocrit 36.0 - 46.0 % 48.9(H) 49.0(A) 46.4  Platelets 150 - 400 K/uL 237 - -   Lab Results  Component Value Date   MCV 90.7 12/25/2014   MCV 91.4 10/28/2014   MCV 93.6 08/12/2013   Lab Results  Component Value Date   TSH 1.206 12/25/2014   No results found for: HGBA1C   BNP No results found for: BNP  ProBNP No results found for: PROBNP   Lipid Panel  No results found for: CHOL, TRIG, HDL, CHOLHDL, VLDL, LDLCALC, LDLDIRECT  RADIOLOGY: No results found.   ASSESSMENT AND PLAN:  Ms. Kailly Richoux is a 60 year old female who has a prior tobacco history of approximately 20 years and last year was significantly incapacitated as a result of severe gastroparesis, leaving her bedridden for 7 months. She continues to experience significant shortness of breath with minimal activity.  She has noticed some vague chest pressure when she gets short of breath and denies any awareness of associated wheezing.  She has been found to have 2 small lung nodules which appear to be benign but will be having follow-up evaluation. She did have mold exposure and currently is not living at her home and is living with her sister. According to the sister, there is no history suggestive of sleep apnea. I have recommended that laboratory be obtained in the fasting state. I am scheduling her for 2-D echo Doppler study to evaluate both systolic and diastolic function, valvular architecture, and obtain noninvasive assessment of pulmonary pressure. She will also be scheduled for [place] study to make certain her shortness of breath is not an ischemic equivalent in evaluation of potential CAD. I will see her back in the office in approximate 46 weeks for follow-up evaluation and further recommendations will be made at that time.   Troy Sine, MD, [facility] 08/04/2015 1:06 PM

## 2015-08-05 ENCOUNTER — Encounter: Payer: Self-pay | Admitting: Cardiovascular Disease

## 2015-08-05 DIAGNOSIS — Z87891 Personal history of nicotine dependence: Secondary | ICD-10-CM | POA: Insufficient documentation

## 2015-08-05 DIAGNOSIS — R0789 Other chest pain: Secondary | ICD-10-CM | POA: Insufficient documentation

## 2015-08-11 ENCOUNTER — Telehealth (HOSPITAL_COMMUNITY): Payer: Self-pay

## 2015-08-11 NOTE — Telephone Encounter (Signed)
Encounter complete. 

## 2015-08-13 ENCOUNTER — Telehealth: Payer: Self-pay | Admitting: Emergency Medicine

## 2015-08-13 ENCOUNTER — Ambulatory Visit (HOSPITAL_COMMUNITY)
Admission: RE | Admit: 2015-08-13 | Discharge: 2015-08-13 | Disposition: A | Discharge status: Home / Self Care | Payer: 59 | Source: Ambulatory Visit | Attending: Cardiovascular Disease | Admitting: Cardiovascular Disease

## 2015-08-13 DIAGNOSIS — R079 Chest pain, unspecified: Secondary | ICD-10-CM | POA: Diagnosis not present

## 2015-08-13 DIAGNOSIS — R0602 Shortness of breath: Secondary | ICD-10-CM | POA: Diagnosis not present

## 2015-08-13 DIAGNOSIS — R0609 Other forms of dyspnea: Secondary | ICD-10-CM | POA: Diagnosis not present

## 2015-08-13 DIAGNOSIS — R42 Dizziness and giddiness: Secondary | ICD-10-CM | POA: Insufficient documentation

## 2015-08-13 DIAGNOSIS — Z87891 Personal history of nicotine dependence: Secondary | ICD-10-CM | POA: Insufficient documentation

## 2015-08-13 DIAGNOSIS — R5383 Other fatigue: Secondary | ICD-10-CM | POA: Diagnosis not present

## 2015-08-13 LAB — MYOCARDIAL PERFUSION IMAGING
CHL CUP NUCLEAR SDS: 0
CHL CUP RESTING HR STRESS: 63 {beats}/min
CSEPPHR: 115 {beats}/min
LV dias vol: 67 mL
LVSYSVOL: 24 mL
SRS: 4
SSS: 4
TID: 1.14

## 2015-08-13 MED ORDER — REGADENOSON 0.4 MG/5ML IV SOLN
0.4000 mg | Freq: Once | INTRAVENOUS | Status: AC
  Administered 2015-08-13: 0.4 mg via INTRAVENOUS

## 2015-08-13 MED ORDER — TECHNETIUM TC 99M SESTAMIBI GENERIC - CARDIOLITE
30.3000 | Freq: Once | INTRAVENOUS | Status: AC | PRN
  Administered 2015-08-13: 30.3 via INTRAVENOUS

## 2015-08-13 MED ORDER — AMINOPHYLLINE 25 MG/ML IV SOLN
75.0000 mg | Freq: Once | INTRAVENOUS | Status: AC
  Administered 2015-08-13: 75 mg via INTRAVENOUS

## 2015-08-13 MED ORDER — TECHNETIUM TC 99M SESTAMIBI GENERIC - CARDIOLITE
10.3000 | Freq: Once | INTRAVENOUS | Status: AC | PRN
  Administered 2015-08-13: 10.3 via INTRAVENOUS

## 2015-08-13 NOTE — Telephone Encounter (Signed)
Patient calling to have CT sent to Dr. Laney Pastor.  Advised patient that Dr. Laney Pastor is linked through [facility] and he has a copy of all of the records.  She requested that I send message to Dr. Laney Pastor requesting that he review the CT scan and give his recommendations regarding the CT done 05/31/15.  Sending message as [name] to Dr. Laney Pastor

## 2015-08-14 NOTE — Telephone Encounter (Signed)
I agree with everything Dr Lamonte Sakai has suggested including the cardiology referral Nodules appear benign but with her smoking history she needs to be rechecked as planned

## 2015-08-15 ENCOUNTER — Encounter: Payer: Self-pay | Admitting: Emergency Medicine

## 2015-08-15 NOTE — Telephone Encounter (Signed)
Pt wanted you to know the reason for concern is she has applied for life insurance and didn't want the diagnosis of COPD on her history as she states the CT scan proved she did not have COPD.

## 2015-08-15 NOTE — Telephone Encounter (Signed)
Let her know there is now no mention of COPD on her problem list and that Dr Agustina Caroli last note indicates normal lung function from that standpoint

## 2015-08-16 ENCOUNTER — Other Ambulatory Visit: Payer: Self-pay | Admitting: Cardiovascular Disease

## 2015-08-16 ENCOUNTER — Other Ambulatory Visit: Payer: Self-pay

## 2015-08-16 ENCOUNTER — Encounter: Payer: Self-pay | Admitting: *Deleted

## 2015-08-16 ENCOUNTER — Ambulatory Visit (HOSPITAL_COMMUNITY): Payer: 59 | Attending: Cardiovascular Disease

## 2015-08-16 DIAGNOSIS — Z87891 Personal history of nicotine dependence: Secondary | ICD-10-CM | POA: Diagnosis not present

## 2015-08-16 DIAGNOSIS — R0609 Other forms of dyspnea: Secondary | ICD-10-CM | POA: Diagnosis not present

## 2015-08-16 DIAGNOSIS — R079 Chest pain, unspecified: Secondary | ICD-10-CM | POA: Diagnosis present

## 2015-08-16 NOTE — Telephone Encounter (Signed)
Patient notified of Dr. Ninfa Meeker message. Nothing further needed.

## 2015-08-16 NOTE — Telephone Encounter (Signed)
lmtcb X1 to make pt aware of below message.

## 2015-08-16 NOTE — Telephone Encounter (Signed)
Patient returned call, CB is [phone].

## 2015-09-01 ENCOUNTER — Telehealth: Payer: Self-pay | Admitting: *Deleted

## 2015-09-01 NOTE — Telephone Encounter (Signed)
-----   Message from Troy Sine, MD sent at 08/21/2015  3:25 PM EST ----- NL LV EF; grade 2 dd

## 2015-09-01 NOTE — Telephone Encounter (Signed)
Left results on patient's VM. Call back if questions or concerns.

## 2015-09-04 ENCOUNTER — Ambulatory Visit (INDEPENDENT_AMBULATORY_CARE_PROVIDER_SITE_OTHER): Payer: 59

## 2015-09-04 ENCOUNTER — Ambulatory Visit (INDEPENDENT_AMBULATORY_CARE_PROVIDER_SITE_OTHER): Payer: 59 | Admitting: Family Medicine

## 2015-09-04 VITALS — BP 122/64 | HR 78 | Temp 99.5°F | Resp 16 | Ht 63.0 in | Wt 108.0 lb

## 2015-09-04 DIAGNOSIS — R05 Cough: Secondary | ICD-10-CM

## 2015-09-04 DIAGNOSIS — R059 Cough, unspecified: Secondary | ICD-10-CM

## 2015-09-04 DIAGNOSIS — H6692 Otitis media, unspecified, left ear: Secondary | ICD-10-CM | POA: Diagnosis not present

## 2015-09-04 DIAGNOSIS — R197 Diarrhea, unspecified: Secondary | ICD-10-CM | POA: Diagnosis not present

## 2015-09-04 DIAGNOSIS — R509 Fever, unspecified: Secondary | ICD-10-CM

## 2015-09-04 DIAGNOSIS — K3184 Gastroparesis: Secondary | ICD-10-CM

## 2015-09-04 DIAGNOSIS — R5383 Other fatigue: Secondary | ICD-10-CM | POA: Diagnosis not present

## 2015-09-04 DIAGNOSIS — E869 Volume depletion, unspecified: Secondary | ICD-10-CM

## 2015-09-04 LAB — POCT CBC
GRANULOCYTE PERCENT: 84.2 % — AB (ref 37–80)
HCT, POC: 41.3 % (ref 37.7–47.9)
Hemoglobin: 14.9 g/dL (ref 12.2–16.2)
Lymph, poc: 1.1 (ref 0.6–3.4)
MCH: 32.9 pg — AB (ref 27–31.2)
MCHC: 36.2 g/dL — AB (ref 31.8–35.4)
MCV: 90.8 fL (ref 80–97)
MID (CBC): 0.5 (ref 0–0.9)
MPV: 8.9 fL (ref 0–99.8)
PLATELET COUNT, POC: 127 10*3/uL — AB (ref 142–424)
POC Granulocyte: 8.5 — AB (ref 2–6.9)
POC LYMPH PERCENT: 10.4 %L (ref 10–50)
POC MID %: 5.4 %M (ref 0–12)
RBC: 4.54 M/uL (ref 4.04–5.48)
RDW, POC: 14.5 %
WBC: 10.1 10*3/uL (ref 4.6–10.2)

## 2015-09-04 LAB — POCT INFLUENZA A/B
INFLUENZA B, POC: NEGATIVE
Influenza A, POC: NEGATIVE

## 2015-09-04 MED ORDER — AZITHROMYCIN 250 MG PO TABS: ORAL_TABLET | ORAL | Status: DC

## 2015-09-04 NOTE — Patient Instructions (Addendum)
IF you received an x-ray today, you will receive an invoice from [facility]. Please contact [facility] at [phone] with questions or concerns regarding your invoice.   IF you received labwork today, you will receive an invoice from [employer]. Please contact [facility] at [phone] with questions or concerns regarding your invoice.   Our billing staff will not be able to assist you with questions regarding bills from these companies.  You will be contacted with the lab results as soon as they are available. The fastest way to get your results is to activate your My Chart account. Instructions are located on the last page of this paperwork. If you have not heard from [place] regarding the results in 2 weeks, please contact this office.    He can start the azithromycin for the left ear infection and possible early bronchitis. Mucinex or Mucinex DM as needed for cough, if you are wheezing or short of breath, Or worsening symptoms, recommend recheck here or emergency room.  For dehydration, drink small sips of fluids frequently as we discussed. If you are having more lightheadedness, dizziness, or trouble keeping fluids down, return here or emergency room for possible repeat IV fluids.  Unfortunately, diarrhea can be worsened by antibiotics, but if you do have worsening diarrhea, recommend recheck here or other provider for other infectious diarrhea testing.   Return to the clinic or go to the nearest emergency room if any of your symptoms worsen or new symptoms occur.  Otitis Media, Adult Otitis media is redness, soreness, and inflammation of the middle ear. Otitis media may be caused by allergies or, most commonly, by infection. Often it occurs as a complication of the common cold. SIGNS AND SYMPTOMS Symptoms of otitis media may include:  Earache.  Fever.  Ringing in your ear.  Headache.  Leakage of fluid from the ear. DIAGNOSIS To  diagnose otitis media, your health care provider will examine your ear with an otoscope. This is an instrument that allows your health care provider to see into your ear in order to examine your eardrum. Your health care provider also will ask you questions about your symptoms. TREATMENT  Typically, otitis media resolves on its own within 3-5 days. Your health care provider may prescribe medicine to ease your symptoms of pain. If otitis media does not resolve within 5 days or is recurrent, your health care provider may prescribe antibiotic medicines if he or she suspects that a bacterial infection is the cause. HOME CARE INSTRUCTIONS   If you were prescribed an antibiotic medicine, finish it all even if you start to feel better.  Take medicines only as directed by your health care provider.  Keep all follow-up visits as directed by your health care provider. SEEK MEDICAL CARE IF:  You have otitis media only in one ear, or bleeding from your nose, or both.  You notice a lump on your neck.  You are not getting better in 3-5 days.  You feel worse instead of better. SEEK IMMEDIATE MEDICAL CARE IF:   You have pain that is not controlled with medicine.  You have swelling, redness, or pain around your ear or stiffness in your neck.  You notice that part of your face is paralyzed.  You notice that the bone behind your ear (mastoid) is tender when you touch it. MAKE SURE YOU:   Understand these instructions.  Will watch your condition.  Will get help right away if you are not doing  well or get worse.   This information is not intended to replace advice given to you by your health care provider. Make sure you discuss any questions you have with your health care provider.   Document Released: 03/03/2004 Document Revised: 06/19/2014 Document Reviewed: 12/24/2012 Elsevier Interactive Patient Education 2016 [employer].  Diarrhea Diarrhea is frequent loose and watery bowel movements. It  can cause you to feel weak and dehydrated. Dehydration can cause you to become tired and thirsty, have a dry mouth, and have decreased urination that often is dark yellow. Diarrhea is a sign of another problem, most often an infection that will not last long. In most cases, diarrhea typically lasts 2-3 days. However, it can last longer if it is a sign of something more serious. It is important to treat your diarrhea as directed by your caregiver to lessen or prevent future episodes of diarrhea. CAUSES  Some common causes include:  Gastrointestinal infections caused by viruses, bacteria, or parasites.  Food poisoning or food allergies.  Certain medicines, such as antibiotics, chemotherapy, and laxatives.  Artificial sweeteners and fructose.  Digestive disorders. HOME CARE INSTRUCTIONS  Ensure adequate fluid intake (hydration): Have 1 cup (8 oz) of fluid for each diarrhea episode. Avoid fluids that contain simple sugars or sports drinks, fruit juices, whole milk products, and sodas. Your urine should be clear or pale yellow if you are drinking enough fluids. Hydrate with an oral rehydration solution that you can purchase at pharmacies, retail stores, and online. You can prepare an oral rehydration solution at home by mixing the following ingredients together:   - tsp table salt.   tsp baking soda.   tsp salt substitute containing potassium chloride.  1  tablespoons sugar.  1 L (34 oz) of water.  Certain foods and beverages may increase the speed at which food moves through the gastrointestinal (GI) tract. These foods and beverages should be avoided and include:  Caffeinated and alcoholic beverages.  High-fiber foods, such as raw fruits and vegetables, nuts, seeds, and whole grain breads and cereals.  Foods and beverages sweetened with sugar alcohols, such as xylitol, sorbitol, and mannitol.  Some foods may be well tolerated and may help thicken stool including:  Starchy foods,  such as rice, toast, pasta, low-sugar cereal, oatmeal, grits, baked potatoes, crackers, and bagels.  Bananas.  Applesauce.  Add probiotic-rich foods to help increase healthy bacteria in the GI tract, such as yogurt and fermented milk products.  Wash your hands well after each diarrhea episode.  Only take over-the-counter or prescription medicines as directed by your caregiver.  Take a warm bath to relieve any burning or pain from frequent diarrhea episodes. SEEK IMMEDIATE MEDICAL CARE IF:   You are unable to keep fluids down.  You have persistent vomiting.  You have blood in your stool, or your stools are black and tarry.  You do not urinate in 6-8 hours, or there is only a small amount of very dark urine.  You have abdominal pain that increases or localizes.  You have weakness, dizziness, confusion, or light-headedness.  You have a severe headache.  Your diarrhea gets worse or does not get better.  You have a fever or persistent symptoms for more than 2-3 days.  You have a fever and your symptoms suddenly get worse. MAKE SURE YOU:   Understand these instructions.  Will watch your condition.  Will get help right away if you are not doing well or get worse.   This information is not  intended to replace advice given to you by your health care provider. Make sure you discuss any questions you have with your health care provider.   Document Released: 05/19/2002 Document Revised: 06/19/2014 Document Reviewed: 02/04/2012 Elsevier Interactive Patient Education 2016 [employer]  Cough, Adult Coughing is a reflex that clears your throat and your airways. Coughing helps to heal and protect your lungs. It is normal to cough occasionally, but a cough that happens with other symptoms or lasts a long time may be a sign of a condition that needs treatment. A cough may last only 2-3 weeks (acute), or it may last longer than 8 weeks (chronic). CAUSES Coughing is commonly caused  by:  Breathing in substances that irritate your lungs.  A viral or bacterial respiratory infection.  Allergies.  Asthma.  Postnasal drip.  Smoking.  Acid backing up from the stomach into the esophagus (gastroesophageal reflux).  Certain medicines.  Chronic lung problems, including COPD (or rarely, lung cancer).  Other medical conditions such as heart failure. HOME CARE INSTRUCTIONS  Pay attention to any changes in your symptoms. Take these actions to help with your discomfort:  Take medicines only as told by your health care provider.  If you were prescribed an antibiotic medicine, take it as told by your health care provider. Do not stop taking the antibiotic even if you start to feel better.  Talk with your health care provider before you take a cough suppressant medicine.  Drink enough fluid to keep your urine clear or pale yellow.  If the air is dry, use a cold steam vaporizer or humidifier in your bedroom or your home to help loosen secretions.  Avoid anything that causes you to cough at work or at home.  If your cough is worse at night, try sleeping in a semi-upright position.  Avoid cigarette smoke. If you smoke, quit smoking. If you need help quitting, ask your health care provider.  Avoid caffeine.  Avoid alcohol.  Rest as needed. SEEK MEDICAL CARE IF:   You have new symptoms.  You cough up pus.  Your cough does not get better after 2-3 weeks, or your cough gets worse.  You cannot control your cough with suppressant medicines and you are losing sleep.  You develop pain that is getting worse or pain that is not controlled with pain medicines.  You have a fever.  You have unexplained weight loss.  You have night sweats. SEEK IMMEDIATE MEDICAL CARE IF:  You cough up blood.  You have difficulty breathing.  Your heartbeat is very fast.   This information is not intended to replace advice given to you by your health care provider. Make sure you  discuss any questions you have with your health care provider.   Document Released: 11/25/2010 Document Revised: 02/17/2015 Document Reviewed: 08/05/2014 Elsevier Interactive Patient Education [employer].

## 2015-09-04 NOTE — Progress Notes (Addendum)
Subjective:    Patient ID: Michaela Hall, female    DOB: 1956/06/03, 60 y.o.   MRN: [id number] By signing my name below, I, Judithe Modest, attest that this documentation has been prepared under the direction and in the presence of Merri Ray, MD. Electronically Signed: Judithe Modest, ER Scribe. 09/04/2015. 3:56 PM.  Chief Complaint  Patient presents with  . ear problem    left, pt feels like it is busted eardrum, x today   . Diarrhea    x 4 days   . Sore Throat  . Fatigue    HPI HPI Comments: Michaela Hall is a 59 y.o. female with a past hx of gastroparesis who presents to [facility] today complaining of diarrhea (4-6 events per day), vomiting (1-2 events per day), HA, cough, fatigue and sore throat for the last four days. There has been intermittent bright red blood in her diarrhea. She is also complaining of lightheadedness and dizziness for the last 48 hours. She had sick contacts with family members 10 days ago who tested positive for flu B. She also woke up this morning with signigicant left ear pain. She denies discharge or drainage out of left ear. She states she feels very dehydrated.   Last week she had a bout of gastroparesis with vomiting. She was not able to eat or drink much all week.    Patient Active Problem List   Diagnosis Date Noted  . History of tobacco abuse 08/05/2015  . Chest pressure 08/05/2015  . Pulmonary nodules 07/23/2015  . Chronic cough 05/19/2015  . Dyspnea 05/19/2015  . 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 Anxiety state, unspecified 08/12/2012  . Insomnia 08/12/2012  . Menopause 08/12/2012  . Hyperthyroidism 07/31/2012  . Nausea with vomiting 10/03/2011   Past Medical History  Diagnosis Date  . Chronic pain     back and right hip  . Complication of anesthesia   . PONV (postoperative nausea and vomiting)   . Headache(784.0)   . DJD (degenerative joint disease)   . Depression   . Anxiety    Past Surgical History  Procedure Laterality  Date  . Back surgery    . Hip surgery    . Tonsillectomy    . Knee surgery    . Esophagogastroduodenoscopy  09/28/2011    Procedure: ESOPHAGOGASTRODUODENOSCOPY (EGD);  Surgeon: Winfield Cunas., MD;  Location: [facility];  Service: Endoscopy;  Laterality: N/A;  . Spine surgery     No Known Allergies Prior to Admission medications   Medication Sig Start Date End Date Taking? Authorizing Provider  ondansetron (ZOFRAN-ODT) 8 MG disintegrating tablet Take 1 tablet by mouth 3 (three) times daily. Take 1 tab by mouth tid 08/03/15  Yes Historical Provider, MD  Oxycodone HCl 10 MG TABS Take 10 mg by mouth 2 (two) times daily as needed. 09/25/14  Yes Historical Provider, MD  progesterone (PROMETRIUM) 100 MG capsule Take 1 capsule by mouth at bedtime. Take 1 cap by mouth daily at bedtime 07/27/15  Yes Historical Provider, MD  VIVELLE-DOT 0.05 MG/24HR patch Place 1 patch onto the skin 2 (two) times a week. Place 1 patch onto the skin 2 (two) times a week. 07/26/15  Yes Historical Provider, MD   Social History   Social History  . Marital Status: Married    Spouse Name: N/A  . Number of Children: 3  . Years of Education: N/A   Occupational History  . Medicare ins. [employer]  History Main Topics  . Smoking status: Former Smoker -- 0.50 packs/day for 20 years    Types: Cigarettes    Quit date: 06/12/2013  . Smokeless tobacco: Never Used  . Alcohol Use: No  . Drug Use: No  . Sexual Activity: Not on file   Other Topics Concern  . Not on file   Social History Narrative   Last colon screening: 09/2011    Review of Systems  Constitutional: Positive for fatigue. Negative for fever and chills.  HENT: Positive for ear pain and sore throat.   Respiratory: Positive for cough and shortness of breath.   Gastrointestinal: Positive for nausea, vomiting and diarrhea.  Musculoskeletal: Positive for myalgias.  Neurological: Positive for headaches.       Objective:  BP  122/64 mmHg  Pulse 78  Temp(Src) 99.5 F (37.5 C) (Oral)  Resp 16  Ht 5\' 3"  (1.6 m)  Wt 108 lb (48.988 kg)  BMI 19.14 kg/m2  SpO2 96%  Physical Exam  Constitutional: She is oriented to person, place, and time. She appears well-developed and well-nourished. No distress.  HENT:  Head: Normocephalic and atraumatic.  Right TM is pearly grey with minimal clear fluid at the base of the TM. Left ear is retracted and injected with clear to which effusion at the base. Canal is clear.   Eyes: Pupils are equal, round, and reactive to light.  Neck: Neck supple.  Slight tenderness on left SCM, no lymphadenopathy.   Cardiovascular: Normal rate.   Pulmonary/Chest: Effort normal. No respiratory distress.  Few course breath sounds left lower lobe, but overall clear.   Abdominal:  Abdomen flat. No focal tenderness. Generalized abdominal discomfort.  Musculoskeletal: Normal range of motion.  Neurological: She is alert and oriented to person, place, and time. Coordination normal.  Skin: Skin is warm and dry. She is not diaphoretic.  Psychiatric: She has a normal mood and affect. Her behavior is normal.  Nursing note and vitals reviewed.  Results for orders placed or performed in visit on 09/04/15  POCT CBC  Result Value Ref Range   WBC 10.1 4.6 - 10.2 K/uL   Lymph, poc 1.1 0.6 - 3.4   POC LYMPH PERCENT 10.4 10 - 50 %L   MID (cbc) 0.5 0 - 0.9   POC MID % 5.4 0 - 12 %M   POC Granulocyte 8.5 (A) 2 - 6.9   Granulocyte percent 84.2 (A) 37 - 80 %G   RBC 4.54 4.04 - 5.48 M/uL   Hemoglobin 14.9 12.2 - 16.2 g/dL   HCT, POC 41.3 37.7 - 47.9 %   MCV 90.8 80 - 97 fL   MCH, POC 32.9 (A) 27 - 31.2 pg   MCHC 36.2 (A) 31.8 - 35.4 g/dL   RDW, POC 14.5 %   Platelet Count, POC 127 (A) 142 - 424 K/uL   MPV 8.9 0 - 99.8 fL  POCT Influenza A/B  Result Value Ref Range   Influenza A, POC Negative Negative   Influenza B, POC Negative Negative   Orthostatic VS for the past 24 hrs:  BP- Lying Pulse- Lying BP-  Sitting Pulse- Sitting BP- Standing at 0 minutes Pulse- Standing at 0 minutes  09/04/15 1601 127/76 mmHg 83 125/65 mmHg 89 115/78 mmHg 86   1liter NS given  Feels improved after 1L IVF.      Assessment & Plan:   Michaela Hall is a 60 y.o. female Fever, unspecified - Plan: DG Chest 2 View, POCT CBC, POCT  Influenza A/B  Other fatigue - Plan: DG Chest 2 View, POCT CBC, POCT Influenza A/B  Gastroparesis  Diarrhea, unspecified type  Volume depletion  Acute left otitis media, recurrence not specified, unspecified otitis media type - Plan: azithromycin (ZITHROMAX) 250 MG tablet  Cough - Plan: DG Chest 2 View, azithromycin (ZITHROMAX) 250 MG tablet   Suspected initial viral illness/possible false negative flu testing, with secondary otitis media and now volume depletion with her previous gastroparesis and now diarrhea. Improved symptoms of fatigue after 1 L of IV fluid  - discussed oral rehydration therapy at home with frequent sips of fluids. Continue usual treatment for gastroparesis.  -Azithromycin for otitis media, as well as to cover for early bronchitis/lower respiratory tract infection.  -Mucinex, symptomatic care for cough. RTC precautions.  -Discussed symptomatic care for diarrhea, and possible chance of worsening diarrhea with antibiotic. If this does occur, discussed need for return to clinic and further testing - possible GI pathogen panel testing. Understanding expressed.  Meds ordered this encounter  Medications  . azithromycin (ZITHROMAX) 250 MG tablet    Sig: Take 2 pills by mouth on day 1, then 1 pill by mouth per day on days 2 through 5.    Dispense:  6 tablet    Refill:  0   Patient Instructions       IF you received an x-ray today, you will receive an invoice from [facility]. Please contact [facility] at [phone] with questions or concerns regarding your invoice.   IF you received labwork today, you will receive an invoice from  [employer]. Please contact [facility] at [phone] with questions or concerns regarding your invoice.   Our billing staff will not be able to assist you with questions regarding bills from these companies.  You will be contacted with the lab results as soon as they are available. The fastest way to get your results is to activate your My Chart account. Instructions are located on the last page of this paperwork. If you have not heard from [place] regarding the results in 2 weeks, please contact this office.    He can start the azithromycin for the left ear infection and possible early bronchitis. Mucinex or Mucinex DM as needed for cough, if you are wheezing or short of breath, Or worsening symptoms, recommend recheck here or emergency room.  For dehydration, drink small sips of fluids frequently as we discussed. If you are having more lightheadedness, dizziness, or trouble keeping fluids down, return here or emergency room for possible repeat IV fluids.  Unfortunately, diarrhea can be worsened by antibiotics, but if you do have worsening diarrhea, recommend recheck here or other provider for other infectious diarrhea testing.   Return to the clinic or go to the nearest emergency room if any of your symptoms worsen or new symptoms occur.  Otitis Media, Adult Otitis media is redness, soreness, and inflammation of the middle ear. Otitis media may be caused by allergies or, most commonly, by infection. Often it occurs as a complication of the common cold. SIGNS AND SYMPTOMS Symptoms of otitis media may include:  Earache.  Fever.  Ringing in your ear.  Headache.  Leakage of fluid from the ear. DIAGNOSIS To diagnose otitis media, your health care provider will examine your ear with an otoscope. This is an instrument that allows your health care provider to see into your ear in order to examine your eardrum. Your health care provider also will ask you questions about  your  symptoms. TREATMENT  Typically, otitis media resolves on its own within 3-5 days. Your health care provider may prescribe medicine to ease your symptoms of pain. If otitis media does not resolve within 5 days or is recurrent, your health care provider may prescribe antibiotic medicines if he or she suspects that a bacterial infection is the cause. HOME CARE INSTRUCTIONS   If you were prescribed an antibiotic medicine, finish it all even if you start to feel better.  Take medicines only as directed by your health care provider.  Keep all follow-up visits as directed by your health care provider. SEEK MEDICAL CARE IF:  You have otitis media only in one ear, or bleeding from your nose, or both.  You notice a lump on your neck.  You are not getting better in 3-5 days.  You feel worse instead of better. SEEK IMMEDIATE MEDICAL CARE IF:   You have pain that is not controlled with medicine.  You have swelling, redness, or pain around your ear or stiffness in your neck.  You notice that part of your face is paralyzed.  You notice that the bone behind your ear (mastoid) is tender when you touch it. MAKE SURE YOU:   Understand these instructions.  Will watch your condition.  Will get help right away if you are not doing well or get worse.   This information is not intended to replace advice given to you by your health care provider. Make sure you discuss any questions you have with your health care provider.   Document Released: 03/03/2004 Document Revised: 06/19/2014 Document Reviewed: 12/24/2012 Elsevier Interactive Patient Education 2016 [employer].  Diarrhea Diarrhea is frequent loose and watery bowel movements. It can cause you to feel weak and dehydrated. Dehydration can cause you to become tired and thirsty, have a dry mouth, and have decreased urination that often is dark yellow. Diarrhea is a sign of another problem, most often an infection that will not last long. In  most cases, diarrhea typically lasts 2-3 days. However, it can last longer if it is a sign of something more serious. It is important to treat your diarrhea as directed by your caregiver to lessen or prevent future episodes of diarrhea. CAUSES  Some common causes include:  Gastrointestinal infections caused by viruses, bacteria, or parasites.  Food poisoning or food allergies.  Certain medicines, such as antibiotics, chemotherapy, and laxatives.  Artificial sweeteners and fructose.  Digestive disorders. HOME CARE INSTRUCTIONS  Ensure adequate fluid intake (hydration): Have 1 cup (8 oz) of fluid for each diarrhea episode. Avoid fluids that contain simple sugars or sports drinks, fruit juices, whole milk products, and sodas. Your urine should be clear or pale yellow if you are drinking enough fluids. Hydrate with an oral rehydration solution that you can purchase at pharmacies, retail stores, and online. You can prepare an oral rehydration solution at home by mixing the following ingredients together:   - tsp table salt.   tsp baking soda.   tsp salt substitute containing potassium chloride.  1  tablespoons sugar.  1 L (34 oz) of water.  Certain foods and beverages may increase the speed at which food moves through the gastrointestinal (GI) tract. These foods and beverages should be avoided and include:  Caffeinated and alcoholic beverages.  High-fiber foods, such as raw fruits and vegetables, nuts, seeds, and whole grain breads and cereals.  Foods and beverages sweetened with sugar alcohols, such as xylitol, sorbitol, and mannitol.  Some foods may be well  tolerated and may help thicken stool including:  Starchy foods, such as rice, toast, pasta, low-sugar cereal, oatmeal, grits, baked potatoes, crackers, and bagels.  Bananas.  Applesauce.  Add probiotic-rich foods to help increase healthy bacteria in the GI tract, such as yogurt and fermented milk products.  Wash your  hands well after each diarrhea episode.  Only take over-the-counter or prescription medicines as directed by your caregiver.  Take a warm bath to relieve any burning or pain from frequent diarrhea episodes. SEEK IMMEDIATE MEDICAL CARE IF:   You are unable to keep fluids down.  You have persistent vomiting.  You have blood in your stool, or your stools are black and tarry.  You do not urinate in 6-8 hours, or there is only a small amount of very dark urine.  You have abdominal pain that increases or localizes.  You have weakness, dizziness, confusion, or light-headedness.  You have a severe headache.  Your diarrhea gets worse or does not get better.  You have a fever or persistent symptoms for more than 2-3 days.  You have a fever and your symptoms suddenly get worse. MAKE SURE YOU:   Understand these instructions.  Will watch your condition.  Will get help right away if you are not doing well or get worse.   This information is not intended to replace advice given to you by your health care provider. Make sure you discuss any questions you have with your health care provider.   Document Released: 05/19/2002 Document Revised: 06/19/2014 Document Reviewed: 02/04/2012 Elsevier Interactive Patient Education 2016 Elsevier Inc.  Cough, Adult Coughing is a reflex that clears your throat and your airways. Coughing helps to heal and protect your lungs. It is normal to cough occasionally, but a cough that happens with other symptoms or lasts a long time may be a sign of a condition that needs treatment. A cough may last only 2-3 weeks (acute), or it may last longer than 8 weeks (chronic). CAUSES Coughing is commonly caused by:  Breathing in substances that irritate your lungs.  A viral or bacterial respiratory infection.  Allergies.  Asthma.  Postnasal drip.  Smoking.  Acid backing up from the stomach into the esophagus (gastroesophageal reflux).  Certain  medicines.  Chronic lung problems, including COPD (or rarely, lung cancer).  Other medical conditions such as heart failure. HOME CARE INSTRUCTIONS  Pay attention to any changes in your symptoms. Take these actions to help with your discomfort:  Take medicines only as told by your health care provider.  If you were prescribed an antibiotic medicine, take it as told by your health care provider. Do not stop taking the antibiotic even if you start to feel better.  Talk with your health care provider before you take a cough suppressant medicine.  Drink enough fluid to keep your urine clear or pale yellow.  If the air is dry, use a cold steam vaporizer or humidifier in your bedroom or your home to help loosen secretions.  Avoid anything that causes you to cough at work or at home.  If your cough is worse at night, try sleeping in a semi-upright position.  Avoid cigarette smoke. If you smoke, quit smoking. If you need help quitting, ask your health care provider.  Avoid caffeine.  Avoid alcohol.  Rest as needed. SEEK MEDICAL CARE IF:   You have new symptoms.  You cough up pus.  Your cough does not get better after 2-3 weeks, or your cough gets worse.  You cannot control your cough with suppressant medicines and you are losing sleep.  You develop pain that is getting worse or pain that is not controlled with pain medicines.  You have a fever.  You have unexplained weight loss.  You have night sweats. SEEK IMMEDIATE MEDICAL CARE IF:  You cough up blood.  You have difficulty breathing.  Your heartbeat is very fast.   This information is not intended to replace advice given to you by your health care provider. Make sure you discuss any questions you have with your health care provider.   Document Released: 11/25/2010 Document Revised: 02/17/2015 Document Reviewed: 08/05/2014 Elsevier Interactive Patient Education [employer].

## 2015-09-09 ENCOUNTER — Telehealth: Payer: Self-pay | Admitting: Radiology

## 2015-09-09 NOTE — Telephone Encounter (Signed)
-----   Message from [name], MD sent at 09/07/2015 12:59 PM EDT ----- Call patient. Check status. The radiologist did see a possible right lower lobe pneumonia. The azithromycin she was started on should be sufficient to treat this, but if cough not improving in the next few days, or any persistent fevers after 48 hours on antibiotics,, recommend she recheck with me in the next day or 2. Sooner if worse.  As long as she is improving, recommend repeat chest x-ray in 4-6 weeks to make sure this area has cleared. Let me know if she has any questions in the meantime.

## 2015-09-09 NOTE — Telephone Encounter (Signed)
Notes Recorded by Ebony Hail on 09/09/2015 at 9:50 AM The patient called back this morning, she stated that she feel some better, but her Ears are full with fluid now , and went to the pharmacy , and got some Sudafed. Otherwise, she is improving little by little. Still coughing. I also advised her to come to the office to check her ears, if is not getting better in few days , and to repeat CXR in 4-6 weeks Notes Recorded by Ebony Hail on 09/08/2015 at 4:16 PM Called and left message on AM to call back. Notes Recorded by Serita Kyle, Rad Tech on 09/07/2015 at 6:09 PM Left VM to call back.

## 2015-09-15 ENCOUNTER — Ambulatory Visit (INDEPENDENT_AMBULATORY_CARE_PROVIDER_SITE_OTHER): Payer: 59 | Admitting: Family Medicine

## 2015-09-15 VITALS — BP 122/76 | HR 92 | Temp 98.2°F | Resp 16 | Ht 63.0 in | Wt 109.0 lb

## 2015-09-15 DIAGNOSIS — R05 Cough: Secondary | ICD-10-CM | POA: Diagnosis not present

## 2015-09-15 DIAGNOSIS — H938X2 Other specified disorders of left ear: Secondary | ICD-10-CM | POA: Diagnosis not present

## 2015-09-15 DIAGNOSIS — R071 Chest pain on breathing: Secondary | ICD-10-CM

## 2015-09-15 DIAGNOSIS — R059 Cough, unspecified: Secondary | ICD-10-CM

## 2015-09-15 MED ORDER — HYDROCOD POLST-CPM POLST ER 10-8 MG/5ML PO SUER: 5.0000 mL | Freq: Every evening | ORAL | Status: DC | PRN

## 2015-09-15 NOTE — Patient Instructions (Addendum)
Continue tylenol or ibuprofen for pain  Warm compresses  Cough, Adult Coughing is a reflex that clears your throat and your airways. Coughing helps to heal and protect your lungs. It is normal to cough occasionally, but a cough that happens with other symptoms or lasts a long time may be a sign of a condition that needs treatment. A cough may last only 2-3 weeks (acute), or it may last longer than 8 weeks (chronic). CAUSES Coughing is commonly caused by:  Breathing in substances that irritate your lungs.  A viral or bacterial respiratory infection.  Allergies.  Asthma.  Postnasal drip.  Smoking.  Acid backing up from the stomach into the esophagus (gastroesophageal reflux).  Certain medicines.  Chronic lung problems, including COPD (or rarely, lung cancer).  Other medical conditions such as heart failure. HOME CARE INSTRUCTIONS  Pay attention to any changes in your symptoms. Take these actions to help with your discomfort:  Take medicines only as told by your health care provider.  If you were prescribed an antibiotic medicine, take it as told by your health care provider. Do not stop taking the antibiotic even if you start to feel better.  Talk with your health care provider before you take a cough suppressant medicine.  Drink enough fluid to keep your urine clear or pale yellow.  If the air is dry, use a cold steam vaporizer or humidifier in your bedroom or your home to help loosen secretions.  Avoid anything that causes you to cough at work or at home.  If your cough is worse at night, try sleeping in a semi-upright position.  Avoid cigarette smoke. If you smoke, quit smoking. If you need help quitting, ask your health care provider.  Avoid caffeine.  Avoid alcohol.  Rest as needed. SEEK MEDICAL CARE IF:   You have new symptoms.  You cough up pus.  Your cough does not get better after 2-3 weeks, or your cough gets worse.  You cannot control your cough  with suppressant medicines and you are losing sleep.  You develop pain that is getting worse or pain that is not controlled with pain medicines.  You have a fever.  You have unexplained weight loss.  You have night sweats. SEEK IMMEDIATE MEDICAL CARE IF:  You cough up blood.  You have difficulty breathing.  Your heartbeat is very fast.   This information is not intended to replace advice given to you by your health care provider. Make sure you discuss any questions you have with your health care provider.   Document Released: 11/25/2010 Document Revised: 02/17/2015 Document Reviewed: 08/05/2014 Elsevier Interactive Patient Education 2016 [employer].     IF you received an x-ray today, you will receive an invoice from [facility]. Please contact [facility] at [phone] with questions or concerns regarding your invoice.   IF you received labwork today, you will receive an invoice from [employer]. Please contact [facility] at [phone] with questions or concerns regarding your invoice.   Our billing staff will not be able to assist you with questions regarding bills from these companies.  You will be contacted with the lab results as soon as they are available. The fastest way to get your results is to activate your My Chart account. Instructions are located on the last page of this paperwork. If you have not heard from [place] regarding the results in 2 weeks, please contact this office.

## 2015-09-15 NOTE — Progress Notes (Signed)
Subjective:    Patient ID: Michaela Hall, female    DOB: 01/04/1956, 60 y.o.   MRN: [id number]  HPI This is a pleasant 60 yo female who presents today with left sided upper chest pain that occurs with deep breaths. She has had a bad cough for about 2 weeks. She has occasional clear sputum. She has taken tylenol and ibuprofen with relief. She slept well last night.  Her left ear has been feeling blocked. She took sudafed without relief. She was seen 09/04/15 with acute left OM. She was given Zpack and has had resolution of fevers and feels much improved. Energy better, no wheeze, no SOB.   Recent normal echo/stress test 3/17.   Past Medical History  Diagnosis Date  . Chronic pain     back and right hip  . Complication of anesthesia   . PONV (postoperative nausea and vomiting)   . Headache(784.0)   . DJD (degenerative joint disease)   . Depression   . Anxiety    Past Surgical History  Procedure Laterality Date  . Back surgery    . Hip surgery    . Tonsillectomy    . Knee surgery    . Esophagogastroduodenoscopy  09/28/2011    Procedure: ESOPHAGOGASTRODUODENOSCOPY (EGD);  Surgeon: Winfield Cunas., MD;  Location: [facility];  Service: Endoscopy;  Laterality: N/A;  . Spine surgery     Family History  Problem Relation Age of Onset  . Anesthesia problems Mother   . Cancer Mother     ovary/uterus & breast  . Hyperlipidemia Mother   . Hypertension Mother   . Breast cancer Maternal Aunt   . Ovarian cancer Maternal Grandmother   .     Marland Kitchen Hyperlipidemia Father   . Hypertension Father    Social History  Substance Use Topics  . Smoking status: Former Smoker -- 0.50 packs/day for 20 years    Types: Cigarettes    Quit date: 06/12/2013  . Smokeless tobacco: Never Used  . Alcohol Use: No    Review of Systems Per HPI    Objective:   Physical Exam  Constitutional: She is oriented to person, place, and time. She appears well-developed and well-nourished. No distress.  HENT:   Head: Normocephalic and atraumatic.  Right Ear: Tympanic membrane, external ear and ear canal normal.  Left Ear: External ear and ear canal normal.  Mouth/Throat: Oropharynx is clear and moist.  Left TM opaque with visible fluid level. Not injected. Weber- no lateralization, Rine- AC>BC. Gross hearing normal.   Eyes: Conjunctivae are normal.  Neck: Normal range of motion. Neck supple.  Cardiovascular: Normal rate, regular rhythm and normal heart sounds.   Pulmonary/Chest: Effort normal and breath sounds normal.  Tender at left upper chest and left upper back.   Musculoskeletal: Normal range of motion.  Neurological: She is alert and oriented to person, place, and time.  Skin: Skin is warm and dry. She is not diaphoretic.  Psychiatric: She has a normal mood and affect. Her behavior is normal. Judgment and thought content normal.  Vitals reviewed.     BP 122/76 mmHg  Pulse 92  Temp(Src) 98.2 F (36.8 C) (Oral)  Resp 16  Ht 5\' 3"  (1.6 m)  Wt 109 lb (49.442 kg)  BMI 19.31 kg/m2  SpO2 96% Wt Readings from Last 3 Encounters:  09/15/15 109 lb (49.442 kg)  09/04/15 108 lb (48.988 kg)  08/13/15 109 lb (49.442 kg)        Assessment &  Plan:  1. Chest pain on breathing - likely due to prolonged cough, improvement with tylenol/ibuprofen and heat- continue these measures  2. Clogged ear, left - hearing intact and ear exam improved, still has slight fluid level, discussed that this would take some time to resolve  3. Cough - chlorpheniramine-HYDROcodone (TUSSIONEX PENNKINETIC ER) 10-8 MG/5ML SUER; Take 5 mLs by mouth at bedtime as needed for cough.  Dispense: 70 mL; Refill: 0 - RTC 5-7 days if no improvement Clarene Reamer, FNP-BC  [facility]  09/15/2015 12:11 PM

## 2015-09-28 ENCOUNTER — Telehealth: Payer: Self-pay

## 2015-09-28 NOTE — Telephone Encounter (Signed)
Holly from [employer] is calling to see if we have received a fax request for medical records. Please call once received! [phone]

## 2015-09-29 NOTE — Progress Notes (Signed)
This encounter was created in error - please disregard.

## 2015-10-01 ENCOUNTER — Ambulatory Visit: Payer: 59 | Admitting: Cardiovascular Disease

## 2015-10-21 ENCOUNTER — Encounter: Payer: Self-pay | Admitting: Neurology

## 2015-10-21 ENCOUNTER — Ambulatory Visit (INDEPENDENT_AMBULATORY_CARE_PROVIDER_SITE_OTHER): Payer: 59 | Admitting: Neurology

## 2015-10-21 VITALS — BP 118/74 | HR 72 | Ht 63.0 in | Wt 110.0 lb

## 2015-10-21 DIAGNOSIS — R29898 Other symptoms and signs involving the musculoskeletal system: Secondary | ICD-10-CM | POA: Diagnosis not present

## 2015-10-21 DIAGNOSIS — M47812 Spondylosis without myelopathy or radiculopathy, cervical region: Secondary | ICD-10-CM | POA: Insufficient documentation

## 2015-10-21 HISTORY — DX: Other symptoms and signs involving the musculoskeletal system: R29.898

## 2015-10-21 HISTORY — DX: Spondylosis without myelopathy or radiculopathy, cervical region: M47.812

## 2015-10-21 NOTE — Progress Notes (Signed)
Reason for visit: Left arm pain and weakness  Referring physician: Dr. Horton Chin is a 60 y.o. female  History of present illness:  Ms. Guerriero is a 60 year old right-handed white female with a history of lumbosacral spine surgery done in 1990 and in 1992 by Dr. Joya Salm. The patient seemed to do well following the surgery, but 7 years ago she began having increasing problems with back pain. The patient has had chronic low back pain recently, she has received epidural steroid injections on a regular basis with some benefit. The patient indicates that within the last 6 weeks she has had a significant increase in pain in the left shoulder, with numbness going down the medial aspect of the left arm to the wrist. She feels that the left hand is weak, she is dropping things. She is also had onset of severe shortness of breath over the last 3-4 months, and she has had a workup without any definite etiology being found for this. She has had a CT of the chest. The patient has been concerned about a small 1 cm area of atrophy in the left shoulder area. She denies any issues with controlling the bowels or the bladder. She does have a history of gastroparesis. She has some gait instability, some occasional falls are noted. She feels that she is getting slightly weaker in the left leg. The patient has had MRI evaluation recently for the thoracic spine that shows multilevel spondylosis and spinal stenosis is most significant at the thoracic level 5-6. The patient also had MRI of the lumbar spine that shows multilevel degenerative disc disease with a small disc protrusion at the L3-4 level and lateral recess stenosis at the L4-5 level bilaterally. The patient is sent to this office for further evaluation.  Past Medical History  Diagnosis Date  . Chronic pain     back and right hip  . Complication of anesthesia   . PONV (postoperative nausea and vomiting)   . Headache(784.0)   . DJD (degenerative  joint disease)   . Depression   . Anxiety   . Weakness of left arm 10/21/2015  . Cervical spondylosis without myelopathy 10/21/2015    Past Surgical History  Procedure Laterality Date  . Back surgery    . Hip surgery    . Tonsillectomy    . Knee surgery    . Esophagogastroduodenoscopy  09/28/2011    Procedure: ESOPHAGOGASTRODUODENOSCOPY (EGD);  Surgeon: Winfield Cunas., MD;  Location: [facility];  Service: Endoscopy;  Laterality: N/A;  . Spine surgery      Family History  Problem Relation Age of Onset  . Anesthesia problems Mother   . Cancer Mother     ovary/uterus & breast  . Hyperlipidemia Mother   . Hypertension Mother   . Breast cancer Maternal Aunt   . Ovarian cancer Maternal Grandmother   . Hyperlipidemia Father   . Hypertension Father     Social history:  reports that she quit smoking about 16 months ago. Her smoking use included Cigarettes. She has a 10 pack-year smoking history. She has never used smokeless tobacco. She reports that she does not drink alcohol or use illicit drugs.  Medications:  Prior to Admission medications   Medication Sig Start Date End Date Taking? Authorizing Provider  chlorpheniramine-HYDROcodone (TUSSIONEX PENNKINETIC ER) 10-8 MG/5ML SUER Take 5 mLs by mouth at bedtime as needed for cough. 09/15/15   Elby Beck, FNP  ondansetron (ZOFRAN-ODT) 8 MG disintegrating tablet  Take 1 tablet by mouth 3 (three) times daily. Take 1 tab by mouth tid 08/03/15   Historical Provider, MD  Oxycodone HCl 10 MG TABS Take 10 mg by mouth 2 (two) times daily as needed. 09/25/14   Historical Provider, MD  progesterone (PROMETRIUM) 200 MG capsule  08/13/15   Historical Provider, MD  VIVELLE-DOT 0.05 MG/24HR patch Place 1 patch onto the skin 2 (two) times a week. Place 1 patch onto the skin 2 (two) times a week. 07/26/15   Historical Provider, MD     No Known Allergies  ROS:  Out of a complete 14 system review of symptoms, the patient complains only of the  following symptoms, and all other reviewed systems are negative.  Weight loss, fatigue Chest pain Easy bruising, easy bleeding Shortness of breath, cough Feeling hot, cold, flushing Joint pain, achy muscles Headache, numbness, weakness Decreased energy, change in appetite, disinterest in activities  Blood pressure 118/74, pulse 72, height 5\' 3"  (1.6 m), weight 110 lb (49.896 kg).  Physical Exam  General: The patient is alert and cooperative at the time of the examination.  Eyes: Pupils are equal, round, and reactive to light. Discs are flat bilaterally.  Neck: The neck is supple, no carotid bruits are noted.  Respiratory: The respiratory examination is clear.  Cardiovascular: The cardiovascular examination reveals a regular rate and rhythm, no obvious murmurs or rubs are noted.  Neuromuscular: Range of movement of the cervical spine lacks about 15 of full lateral rotation bilaterally.  Skin: Extremities are without significant edema.  Neurologic Exam  Mental status: The patient is alert and oriented x 3 at the time of the examination. The patient has apparent normal recent and remote memory, with an apparently normal attention span and concentration ability.  Cranial nerves: Facial symmetry is present. There is good sensation of the face to pinprick and soft touch bilaterally. The strength of the facial muscles and the muscles to head turning and shoulder shrug are normal bilaterally. Speech is well enunciated, no aphasia or dysarthria is noted. Extraocular movements are full. Visual fields are full. The tongue is midline, and the patient has symmetric elevation of the soft palate. No obvious hearing deficits are noted.  Motor: The motor testing reveals 5 over 5 strength of all 4 extremities. Good symmetric motor tone is noted throughout.  Sensory: Sensory testing is intact to pinprick, soft touch, vibration sensation, and position sense on all 4 extremities, with exception of  some decrease in pinprick sensation on the left forearm and hand as compared to the right. No evidence of extinction is noted.  Coordination: Cerebellar testing reveals good finger-nose-finger and heel-to-shin bilaterally.  Gait and station: Gait is normal. Tandem gait is normal. Romberg is negative. No drift is seen.  Reflexes: Deep tendon reflexes are symmetric and normal bilaterally, with exception that the left ankle jerk reflex is absent. Toes are downgoing bilaterally.   Assessment/Plan:  1. Cervical and thoracic spondylosis  2. Reports of left arm numbness and weakness  The clinical examination is relatively unremarkable, the patient has some giveaway type weakness and poor effort with the left arm. No true weakness is seen. The patient reports numbness in the left arm in a T1 distribution. The atrophy that she notes on the left shoulder appears to be involving the skin, not the muscle. The patient will be set up for nerve conduction studies of both arms, EMG on the left arm. She will follow-up for this study.  Jill Alexanders MD  10/21/2015 7:46 PM  [facility] [address] [place], [place] [address]  Phone [phone] Fax [phone]

## 2015-11-09 ENCOUNTER — Other Ambulatory Visit: Payer: Self-pay | Admitting: Obstetrics and Gynecology

## 2015-11-09 DIAGNOSIS — Z13820 Encounter for screening for osteoporosis: Secondary | ICD-10-CM

## 2015-11-12 ENCOUNTER — Other Ambulatory Visit: Payer: Self-pay | Admitting: Obstetrics and Gynecology

## 2015-11-12 DIAGNOSIS — E2839 Other primary ovarian failure: Secondary | ICD-10-CM

## 2015-11-12 DIAGNOSIS — Z1231 Encounter for screening mammogram for malignant neoplasm of breast: Secondary | ICD-10-CM

## 2015-11-24 ENCOUNTER — Ambulatory Visit
Admission: RE | Admit: 2015-11-24 | Discharge: 2015-11-24 | Disposition: A | Discharge status: Home / Self Care | Payer: 59 | Source: Ambulatory Visit | Attending: Obstetrics and Gynecology | Admitting: Obstetrics and Gynecology

## 2015-11-24 DIAGNOSIS — E2839 Other primary ovarian failure: Secondary | ICD-10-CM

## 2015-11-24 DIAGNOSIS — Z1231 Encounter for screening mammogram for malignant neoplasm of breast: Secondary | ICD-10-CM

## 2016-07-20 ENCOUNTER — Other Ambulatory Visit: Payer: 59

## 2016-07-24 ENCOUNTER — Encounter: Payer: Self-pay | Admitting: Gastroenterology

## 2016-07-27 ENCOUNTER — Encounter: Payer: Self-pay | Admitting: Emergency Medicine

## 2016-07-27 ENCOUNTER — Ambulatory Visit
Admission: RE | Admit: 2016-07-27 | Discharge: 2016-07-27 | Disposition: A | Discharge status: Home / Self Care | Payer: Managed Care, Other (non HMO) | Source: Ambulatory Visit | Attending: Emergency Medicine | Admitting: Emergency Medicine

## 2016-07-27 ENCOUNTER — Ambulatory Visit (INDEPENDENT_AMBULATORY_CARE_PROVIDER_SITE_OTHER): Payer: Managed Care, Other (non HMO) | Admitting: Emergency Medicine

## 2016-07-27 VITALS — BP 122/60 | HR 67 | Ht 62.0 in | Wt 111.7 lb

## 2016-07-27 DIAGNOSIS — R911 Solitary pulmonary nodule: Secondary | ICD-10-CM

## 2016-07-27 DIAGNOSIS — R059 Cough, unspecified: Secondary | ICD-10-CM

## 2016-07-27 DIAGNOSIS — R05 Cough: Secondary | ICD-10-CM | POA: Diagnosis not present

## 2016-07-27 DIAGNOSIS — R918 Other nonspecific abnormal finding of lung field: Secondary | ICD-10-CM | POA: Diagnosis not present

## 2016-07-27 DIAGNOSIS — R0600 Dyspnea, unspecified: Secondary | ICD-10-CM

## 2016-07-27 DIAGNOSIS — J3489 Other specified disorders of nose and nasal sinuses: Secondary | ICD-10-CM

## 2016-07-27 DIAGNOSIS — R053 Chronic cough: Secondary | ICD-10-CM

## 2016-07-27 MED ORDER — HYDROCOD POLST-CPM POLST ER 10-8 MG/5ML PO SUER: 5.0000 mL | Freq: Every evening | ORAL | 0 refills | Status: DC | PRN

## 2016-07-27 NOTE — Patient Instructions (Signed)
We will refer you to see ENT regarding your L nasal obstruction and sinus fullness.  Your CT scan of the chest is stable. You need a repeat Ct scan without contrast in 1 year to follow pulmonary nodules.  Follow with Dr Lamonte Sakai in 6 months or sooner if you have any problems

## 2016-07-27 NOTE — Progress Notes (Signed)
Subjective:    Patient ID: Michaela Hall, female    DOB: [birth year], 61 y.o.   MRN: [id number]   HPI 61 yo woman, former smoker (15 pk-yrs) with hx chronic pain, HA, DJD, anxiety/depression. She is a self-referral for evaluation of dyspnea and cough. She reports that she had been well until about Fall '15. At that time she developed dry cough, especially at night. Had some nasal drainage, voice changes. She has never really been completely better since. She has been treated on a few occasions with pred and abx without any change. She saw ENT in July, was started on protonix bid. She has am nausea, vomiting. Sweats at night. Also on gabapentin, but not for cough, for pain. She has intermittent acute dyspnea. Has not been at baseline breathing status since last year. She has a mold exposure in her home up until July when she moved elsewhere.   ROV 2/61/17 -- follow-up visit for dyspnea and cough. Her last visit we empirically started Protonix and an allergy regimen (fluticasone and loratadine). Given her history of mold exposure and small tobacco exposure we also formed pulmonary function testing and a CT scan of the chest both of which I have personally reviewed. Chest CT showed two to 3 mm nodules, one in the left upper lobe and another in the anterior right lower lobe that are likely benign but which will need to be followed. Her pulmonary function testing shows normal spirometry and normal diffusion capacity. There is no bronchodilator response. She was unable to do lung volumes effectively and no data was generated. She tells me that her cough is unchanged. She has gastroparesis - has emesis almost every day that is influencing her UA irritation. She does describe some R upper back pain with exertion that corresponds with her dyspnea.   ROV 07/27/16 -- This is a follow-up visit for evaluation of dyspnea and cough. She has gastroparesis and emesis. Chronic cough associated with this. She has been  treated with allergy regimen and PPI. CT chest done today shows that 2 small 26mm nodules that appear stable in size. She denies any rhinitis. She feels a full ness in her L maxillary sinus, ? A nasal obstruction.    Review of Systems  Constitutional: Positive for appetite change and unexpected weight change. Negative for fever.  HENT: Positive for dental problem. Negative for congestion, ear pain, nosebleeds, postnasal drip, rhinorrhea, sinus pressure, sneezing, sore throat and trouble swallowing.   Eyes: Negative for redness and itching.  Respiratory: Positive for cough and shortness of breath. Negative for chest tightness and wheezing.   Cardiovascular: Negative for palpitations and leg swelling.  Gastrointestinal: Negative for nausea and vomiting.       Acid Heartburn  Genitourinary: Negative for dysuria.  Musculoskeletal: Negative for joint swelling.  Skin: Negative for rash.  Neurological: Negative for headaches.  Hematological: Does not bruise/bleed easily.  Psychiatric/Behavioral: Negative for dysphoric mood. The patient is not nervous/anxious.     Past Medical History:  Diagnosis Date  . Anxiety   . Cervical spondylosis without myelopathy 10/21/2015  . Chronic pain    back and right hip  . Complication of anesthesia   . Depression   . DJD (degenerative joint disease)   . Headache(784.0)   . PONV (postoperative nausea and vomiting)   . Weakness of left arm 10/21/2015     Family History  Problem Relation Age of Onset  . Anesthesia problems Mother   . Cancer Mother  ovary/uterus & breast  . Hyperlipidemia Mother   . Hypertension Mother   . Breast cancer Maternal Aunt   . Ovarian cancer Maternal Grandmother   . Hyperlipidemia Father   . Hypertension Father      Social History   Social History  . Marital status: Married    Spouse name: N/A  . Number of children: 3  . Years of education: N/A   Occupational History  . Medicare ins. sales [employer] History Main Topics  . Smoking status: Former Smoker    Packs/day: 0.50    Years: 20.00    Types: Cigarettes    Quit date: 06/12/2014  . Smokeless tobacco: Never Used  . Alcohol use No  . Drug use: No  . Sexual activity: Not on file   Other Topics Concern  . Not on file   Social History Narrative   Lives at home w/ her husband   Right-handed   Very little caffeine use, drinks 7up   Last colon screening: 09/2011     No Known Allergies   Outpatient Medications Prior to Visit  Medication Sig Dispense Refill  . ondansetron (ZOFRAN-ODT) 8 MG disintegrating tablet Take 1 tablet by mouth 3 (three) times daily. Take 1 tab by mouth tid    . Oxycodone HCl 10 MG TABS Take 10 mg by mouth 2 (two) times daily as needed.  0  . progesterone (PROMETRIUM) 200 MG capsule     . VIVELLE-DOT 0.05 MG/24HR patch Place 1 patch onto the skin 2 (two) times a week. Place 1 patch onto the skin 2 (two) times a week.  3  . chlorpheniramine-HYDROcodone (TUSSIONEX PENNKINETIC ER) 10-8 MG/5ML SUER Take 5 mLs by mouth at bedtime as needed for cough. (Patient not taking: Reported on 07/27/2016) 70 mL 0   No facility-administered medications prior to visit.          Objective:   Physical Exam  Vitals:   07/27/16 1513  BP: 122/60  Pulse: 67  SpO2: 97%  Weight: 50.7 kg (111 lb 11.2 oz)  Height: 5\' 2"  (1.575 m)  Gen: Pleasant, thin woman, in no distress,  normal affect  ENT: No lesions,  mouth clear,  oropharynx clear, no postnasal drip, her turbinates are red but I don't see any kind of obstructing lesion  Neck: No JVD, no TMG, no carotid bruits  Lungs: No use of accessory muscles,  clear without rales or rhonchi  Cardiovascular: RRR, heart sounds normal, no murmur or gallops, no peripheral edema  Musculoskeletal: No deformities, no cyanosis or clubbing  Neuro: alert, non focal  Skin: Warm, no lesions or rashes      Assessment & Plan:  Pulmonary nodules Your CT scan of the chest is  stable. You need a repeat Ct scan without contrast in 1 year to follow pulmonary nodules.   Chronic cough Attempt to control reflux and rhinitis sx  Nasal obstruction Referred to ENT

## 2016-10-11 ENCOUNTER — Ambulatory Visit: Payer: 59 | Admitting: Allergy & Immunology

## 2016-11-01 DIAGNOSIS — J3489 Other specified disorders of nose and nasal sinuses: Secondary | ICD-10-CM | POA: Insufficient documentation

## 2016-11-01 NOTE — Assessment & Plan Note (Signed)
Referred to ENT

## 2016-11-01 NOTE — Assessment & Plan Note (Signed)
Attempt to control reflux and rhinitis sx

## 2016-11-01 NOTE — Assessment & Plan Note (Signed)
Your CT scan of the chest is stable. You need a repeat Ct scan without contrast in 1 year to follow pulmonary nodules.

## 2016-11-08 ENCOUNTER — Ambulatory Visit (INDEPENDENT_AMBULATORY_CARE_PROVIDER_SITE_OTHER): Payer: Managed Care, Other (non HMO) | Admitting: Family Medicine

## 2016-11-08 ENCOUNTER — Encounter: Payer: Self-pay | Admitting: Family Medicine

## 2016-11-08 ENCOUNTER — Ambulatory Visit (INDEPENDENT_AMBULATORY_CARE_PROVIDER_SITE_OTHER): Payer: Managed Care, Other (non HMO)

## 2016-11-08 VITALS — BP 100/64 | HR 72 | Temp 98.1°F | Resp 16 | Ht 62.0 in | Wt 106.2 lb

## 2016-11-08 DIAGNOSIS — R509 Fever, unspecified: Secondary | ICD-10-CM

## 2016-11-08 DIAGNOSIS — R05 Cough: Secondary | ICD-10-CM

## 2016-11-08 DIAGNOSIS — K3184 Gastroparesis: Secondary | ICD-10-CM

## 2016-11-08 DIAGNOSIS — J189 Pneumonia, unspecified organism: Secondary | ICD-10-CM

## 2016-11-08 DIAGNOSIS — J181 Lobar pneumonia, unspecified organism: Secondary | ICD-10-CM

## 2016-11-08 DIAGNOSIS — R059 Cough, unspecified: Secondary | ICD-10-CM

## 2016-11-08 LAB — POCT CBC
Granulocyte percent: 82.1 %G — AB (ref 37–80)
HEMATOCRIT: 44.9 % (ref 37.7–47.9)
HEMOGLOBIN: 15.7 g/dL (ref 12.2–16.2)
LYMPH, POC: 1.1 (ref 0.6–3.4)
MCH, POC: 32.8 pg — AB (ref 27–31.2)
MCHC: 35 g/dL (ref 31.8–35.4)
MCV: 93.7 fL (ref 80–97)
MID (CBC): 0.5 (ref 0–0.9)
MPV: 7.6 fL (ref 0–99.8)
POC GRANULOCYTE: 7.1 — AB (ref 2–6.9)
POC LYMPH %: 12.1 % (ref 10–50)
POC MID %: 5.8 % (ref 0–12)
Platelet Count, POC: 193 10*3/uL (ref 142–424)
RBC: 4.8 M/uL (ref 4.04–5.48)
RDW, POC: 13.2 %
WBC: 8.7 10*3/uL (ref 4.6–10.2)

## 2016-11-08 MED ORDER — LEVOFLOXACIN 500 MG PO TABS: 500.0000 mg | ORAL_TABLET | Freq: Every day | ORAL | 0 refills | Status: DC

## 2016-11-08 NOTE — Patient Instructions (Addendum)
X-ray does indicate a pneumonia, and with your vomiting - aspiration pneumonia is possible.  Start Levaquin 1 pill once per day. Recheck in 48-72 hours unless you are feeling significantly better. If worsening prior to that time, return here or the emergency room. Mucinex or Mucinex DM would be the safest to use for your cough based on your other medications.   Plan for repeat chest x-ray in 4-6 weeks to make sure area seen today has cleared.   Aspiration Pneumonia Aspiration pneumonia is an infection in your lungs. It occurs when food, liquid, or stomach contents (vomit) are inhaled (aspirated) into your lungs. When these things get into your lungs, swelling (inflammation) and infection can occur. This can make it difficult for you to breathe. Aspiration pneumonia is a serious condition and can be life threatening. What increases the risk? Aspiration pneumonia is more likely to occur when a person's cough (gag) reflex or ability to swallow has been decreased. Some things that can do this include:  Having a brain injury or disease, such as stroke, seizures, Parkinson's disease, dementia, or amyotrophic lateral sclerosis (ALS).  Being given general anesthetic for procedures.  Being in a coma (unconscious).  Having a narrowing of the tube that carries food to the stomach (esophagus).  Drinking too much alcohol. If a person passes out and vomits, vomit can be swallowed into the lungs.  Taking certain medicines, such as tranquilizers or sedatives. What are the signs or symptoms?  Coughing after swallowing food or liquids.  Breathing problems, such as wheezing or shortness of breath.  Bluish skin. This can be caused by lack of oxygen.  Coughing up food or mucus. The mucus might contain blood, greenish material, or yellowish-white fluid (pus).  Fever.  Chest pain.  Being more tired than usual (fatigue).  Sweating more than usual.  Bad breath. How is this diagnosed? A physical exam  will be done. During the exam, the health care provider will listen to your lungs with a stethoscope to check for:  Crackling sounds in the lungs.  Decreased breath sounds.  A rapid heartbeat. Various tests may be ordered. These may include:  Chest X-ray.  CT scan.  Swallowing study. This test looks at how food is swallowed and whether it goes into your breathing tube (trachea) or food pipe (esophagus).  Sputum culture. Saliva and mucus (sputum) are collected from the lungs or the tubes that carry air to the lungs (bronchi). The sputum is then tested for bacteria.  Bronchoscopy. This test uses a flexible tube (bronchoscope) to see inside the lungs. How is this treated? Treatment will usually include antibiotic medicines. Other medicines may also be used to reduce fever or pain. You may need to be treated in the hospital. In the hospital, your breathing will be carefully monitored. Depending on how well you are breathing, you may need to be given oxygen, or you may need breathing support from a breathing machine (ventilator). For people who fail a swallowing study, a feeding tube might be placed in the stomach, or they may be asked to avoid certain food textures or liquids when they eat. Follow these instructions at home:  Carefully follow any special eating instructions you were given, such as avoiding certain food textures or thickening liquids. This reduces the risk of developing aspiration pneumonia again.  Only take over-the-counter or prescription medicines as directed by your health care provider. Follow the directions carefully.  If you were prescribed antibiotics, take them as directed. Finish them even if  you start to feel better.  Rest as instructed by your health care provider.  Keep all follow-up appointments with your health care provider. Contact a health care provider if:  You develop worsening shortness of breath, wheezing, or difficulty breathing.  You develop a  fever.  You have chest pain. This information is not intended to replace advice given to you by your health care provider. Make sure you discuss any questions you have with your health care provider. Document Released: 03/26/2009 Document Revised: 11/10/2015 Document Reviewed: 11/14/2012 Elsevier Interactive Patient Education  2017 [employer].    We recommend that you schedule a mammogram for breast cancer screening. Typically, you do not need a referral to do this. Please contact a local imaging center to schedule your mammogram.  [facility] - [phone]  *ask for the Radiology Department [facility]) - [phone] or [phone]  [facility] - [phone] [place] [phone] [facility] - [phone]  *ask for the [facility] - [phone]  *ask for the Radiology Department [facility] - [phone]  *ask for the [facility] - [phone]  IF you received an x-ray today, you will receive an invoice from [facility]. Please contact [facility]iology at [phone] with questions or concerns regarding your invoice.   IF you received labwork today, you will receive an invoice from [facility]. Please contact [facility] at [phone] with questions or concerns regarding your invoice.   Our billing staff will not be able to assist you with questions regarding bills from these companies.  You will be contacted with the lab results as soon as they are available. The fastest way to get your results is to activate your My Chart account. Instructions are located on the last page of this paperwork. If you have not heard from [place] regarding the results in 2 weeks, please contact this office.

## 2016-11-08 NOTE — Progress Notes (Signed)
Subjective:  This chart was scribed for Michaela Agreste, MD by [name], at [place] at [facility].  This patient was seen in room 6 and the patient's care was started at 9:50 AM.   Chief Complaint  Patient presents with  . Sore Throat  . Cough    Hx of bronichitis per pt  . Fever    Unspecified     Patient ID: Michaela Hall, female    DOB: 1955/06/20, 61 y.o.   MRN: [id number]  HPI HPI Comments: Michaela Hall is a 61 y.o. female who presents to Primary Care at [facility] for a sore throat and productive cough (yellow phlegm -started yesterday)  onset about three days ago. Her symptoms initially started with a sore throat and headache followed immediately by a cough and chest congestion.  Yesterday, her temperature read around 101.5. Patient feels extremely tight in her chest when her cough comes on  She has taken antihistamines but denies any relief and has not taken any other medications.  She states that she was "so sick" yesterday that she wishes she were in the hospital. Patient will be getting a further evaluation at [facility] (to assess vocal chord dysfunction) as she has been coughing for about 4 years.   Patient is currently compliant with her Omeprazole (once a day) and takes a zyrtec daily as well.  She had an episode of vomiting a couple of days ago and states that she vomits frequently every week due to her gastroparesis.  She does have a gastroenterologist following her at [facility] for this. Patient denies any heavy drinking (maybe once per week).   Patient has had similar symptoms in the past.  She believes that a doctor she had in the past inaccurately diagnosed her with COPD due to frequent bouts of pneumonia.    She has a history of lung nodules, chronic cough and dyspnea, evaluated by pulmonary Jul 27 2016.  She does have gastroparesis and cough thought to be due to this. She had a CT chest done on feb 15th with two small 3 mm nodules that were stable.   Treated with allergy regimen and PPI. She was referred to ENT for sinus issues.    Patient Active Problem List   Diagnosis Date Noted  . Nasal obstruction 11/01/2016  . Weakness of left arm 10/21/2015  . Cervical spondylosis without myelopathy 10/21/2015  . History of tobacco abuse 08/05/2015  . Chest pressure 08/05/2015  . Pulmonary nodules 07/23/2015  . Chronic cough 05/19/2015  . Dyspnea 05/19/2015  . 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 Anxiety state, unspecified 08/12/2012  . Insomnia 08/12/2012  . Menopause 08/12/2012  . Hyperthyroidism 07/31/2012  . Nausea with vomiting 10/03/2011   Past Medical History:  Diagnosis Date  . Anxiety   . Cervical spondylosis without myelopathy 10/21/2015  . Chronic pain    back and right hip  . Complication of anesthesia   . Depression   . DJD (degenerative joint disease)   . Headache(784.0)   . PONV (postoperative nausea and vomiting)   . Weakness of left arm 10/21/2015   Past Surgical History:  Procedure Laterality Date  . BACK SURGERY    . ESOPHAGOGASTRODUODENOSCOPY  09/28/2011   Procedure: ESOPHAGOGASTRODUODENOSCOPY (EGD);  Surgeon: Winfield Cunas., MD;  Location: [facility];  Service: Endoscopy;  Laterality: N/A;  . HIP SURGERY    . KNEE SURGERY    . SPINE SURGERY    . TONSILLECTOMY  No Known Allergies Prior to Admission medications   Medication Sig Start Date End Date Taking? Authorizing Provider  clonazePAM (KLONOPIN) 1 MG tablet Take 1 mg by mouth. 10/18/16 10/18/17 Yes [provider]  ipratropium-albuterol (DUONEB) 0.5-2.5 (3) MG/3ML SOLN Take 3 mLs by nebulization.   Yes [provider]  ondansetron (ZOFRAN-ODT) 8 MG disintegrating tablet Take 1 tablet by mouth 3 (three) times daily. Take 1 tab by mouth tid 08/03/15  Yes [provider]  Oxycodone HCl 10 MG TABS Take 10 mg by mouth 2 (two) times daily as needed. 09/25/14  Yes [provider]  progesterone (PROMETRIUM) 200 MG capsule  08/13/15   Yes [provider]  VIVELLE-DOT 0.05 MG/24HR patch Place 1 patch onto the skin 2 (two) times a week. Place 1 patch onto the skin 2 (two) times a week. 07/26/15  Yes [provider]  chlorpheniramine-HYDROcodone (TUSSIONEX PENNKINETIC ER) 10-8 MG/5ML SUER Take 5 mLs by mouth at bedtime as needed for cough. Patient not taking: Reported on 11/08/2016 07/27/16   Collene Gobble, MD   Social History   Social History  . Marital status: Married    Spouse name: N/A  . Number of children: 3  . Years of education: N/A   Occupational History  . Medicare ins. sales [employer] History Main Topics  . Smoking status: Former Smoker    Packs/day: 0.50    Years: 20.00    Types: Cigarettes    Quit date: 06/12/2014  . Smokeless tobacco: Never Used  . Alcohol use No  . Drug use: No  . Sexual activity: Not on file   Other Topics Concern  . Not on file   Social History Narrative   Lives at home w/ her husband   Right-handed   Very little caffeine use, drinks 7up   Last colon screening: 09/2011      Review of Systems  Constitutional: Positive for fever.  HENT: Positive for congestion and sore throat.   Eyes: Negative for photophobia, pain, redness and itching.  Respiratory: Positive for cough. Negative for shortness of breath.   Gastrointestinal: Positive for vomiting.  Musculoskeletal: Negative for neck pain and neck stiffness.  Neurological: Positive for headaches. Negative for syncope and speech difficulty.       Objective:   Physical Exam  Constitutional: She is oriented to person, place, and time. She appears well-developed and well-nourished. No distress.  HENT:  Head: Normocephalic and atraumatic.  Right Ear: Hearing, tympanic membrane, external ear and ear canal normal.  Left Ear: Hearing, tympanic membrane, external ear and ear canal normal.  Nose: Nose normal.  Mouth/Throat: Oropharynx is clear and moist. No oropharyngeal exudate.  Eyes:  Conjunctivae and EOM are normal. Pupils are equal, round, and reactive to light.  Cardiovascular: Normal rate, regular rhythm, normal heart sounds and intact distal pulses.  Exam reveals no gallop and no friction rub.   No murmur heard. Pulmonary/Chest: She has no rhonchi.  Some possible faint rhonchi in the right lower lobe, no wheezing, normal effort no distress.   Neurological: She is alert and oriented to person, place, and time.  Skin: Skin is warm and dry. No rash noted.  Psychiatric: She has a normal mood and affect. Her behavior is normal.  Vitals reviewed.   Vitals:   11/08/16 0914  BP: 100/64  Pulse: 72  Resp: 16  Temp: 98.1 F (36.7 C)  TempSrc: Oral  SpO2: 98%  Weight: 106 lb 3.2 oz (48.2 kg)  Height: 5\' 2"  (1.575 m)   Results for orders placed or performed in visit on 11/08/16  POCT CBC  Result Value Ref Range   WBC 8.7 4.6 - 10.2 K/uL   Lymph, poc 1.1 0.6 - 3.4   POC LYMPH PERCENT 12.1 10 - 50 %L   MID (cbc) 0.5 0 - 0.9   POC MID % 5.8 0 - 12 %M   POC Granulocyte 7.1 (A) 2 - 6.9   Granulocyte percent 82.1 (A) 37 - 80 %G   RBC 4.80 4.04 - 5.48 M/uL   Hemoglobin 15.7 12.2 - 16.2 g/dL   HCT, POC 44.9 37.7 - 47.9 %   MCV 93.7 80 - 97 fL   MCH, POC 32.8 (A) 27 - 31.2 pg   MCHC 35.0 31.8 - 35.4 g/dL   RDW, POC 13.2 %   Platelet Count, POC 193 142 - 424 K/uL   MPV 7.6 0 - 99.8 fL    Dg Chest 2 View  Result Date: 11/08/2016 CLINICAL DATA:  Cough and fever, former smoker. EXAM: CHEST  2 VIEW COMPARISON:  CT scan chest of July 27, 2016 and chest x-ray of September 04, 2015 FINDINGS: The lungs are mildly hyperinflated. There is parenchymal density in the posterior inferior aspect of the left upper lobe which is new. There is no pleural effusion or pneumothorax. The heart and pulmonary vascularity are normal. The trachea is midline. The bony thorax exhibits no acute abnormality. IMPRESSION: COPD, stable. Increased density posteriorly and inferiorly in the left upper  lobe compatible with pneumonia. Followup PA and lateral chest X-ray is recommended in 3-4 weeks following trial of antibiotic therapy to ensure resolution and exclude underlying malignancy. Electronically Signed   By: David  Martinique M.D.   On: 11/08/2016 10:19        Assessment & Plan:    LORANA MAFFEO is a 61 y.o. female Cough - Plan: DG Chest 2 View  Fever, unspecified - Plan: POCT CBC, DG Chest 2 View  Pneumonia of left upper lobe due to infectious organism ([place]) - Plan: levofloxacin (LEVAQUIN) 500 MG tablet  Gastroparesis  History of gastroparesis. Emesis episodes few times per week. Now with acute onset of fever, cough, left upper lobe pneumonia noted on chest x-ray. Reassuring vital signs, O2 sat normal, reassuring CBC. However with history of gastroparesis, will cover with quinolone for possible aspiration pneumonia versus community-acquired pneumonia.  -Start Levaquin 500 mg daily, potential side effects and tendinopathy risks discussed.  -Mucinex or Mucinex DM as needed for cough. Based on her other medications, did not prescribe narcotic cough syrup or Phenergan DM as she takes Zofran.  - Recheck in 48-72 hours, sooner if worse.  - Plan a recheck chest x-ray in 4-6 weeks.   Meds ordered this encounter  Medications  . DISCONTD: ALPRAZolam (XANAX) 0.25 MG tablet    Sig: TAKE 1-2 TABLETS 3 TIMES DAILY    Refill:  0  . clonazePAM (KLONOPIN) 1 MG tablet    Sig: Take 1 mg by mouth.  . levofloxacin (LEVAQUIN) 500 MG tablet    Sig: Take 1 tablet (500 mg total) by mouth daily.    Dispense:  10 tablet    Refill:  0   Patient Instructions   X-ray does indicate a pneumonia, and with your vomiting - aspiration pneumonia is possible.  Start Levaquin 1 pill once per day. Recheck in 48-72 hours unless you are feeling significantly better. If worsening prior to that time, return here or  the emergency room. Mucinex or Mucinex DM would be the safest to use for your cough based on your  other medications.   Plan for repeat chest x-ray in 4-6 weeks to make sure area seen today has cleared.   Aspiration Pneumonia Aspiration pneumonia is an infection in your lungs. It occurs when food, liquid, or stomach contents (vomit) are inhaled (aspirated) into your lungs. When these things get into your lungs, swelling (inflammation) and infection can occur. This can make it difficult for you to breathe. Aspiration pneumonia is a serious condition and can be life threatening. What increases the risk? Aspiration pneumonia is more likely to occur when a person's cough (gag) reflex or ability to swallow has been decreased. Some things that can do this include:  Having a brain injury or disease, such as stroke, seizures, Parkinson's disease, dementia, or amyotrophic lateral sclerosis (ALS).  Being given general anesthetic for procedures.  Being in a coma (unconscious).  Having a narrowing of the tube that carries food to the stomach (esophagus).  Drinking too much alcohol. If a person passes out and vomits, vomit can be swallowed into the lungs.  Taking certain medicines, such as tranquilizers or sedatives. What are the signs or symptoms?  Coughing after swallowing food or liquids.  Breathing problems, such as wheezing or shortness of breath.  Bluish skin. This can be caused by lack of oxygen.  Coughing up food or mucus. The mucus might contain blood, greenish material, or yellowish-white fluid (pus).  Fever.  Chest pain.  Being more tired than usual (fatigue).  Sweating more than usual.  Bad breath. How is this diagnosed? A physical exam will be done. During the exam, the health care provider will listen to your lungs with a stethoscope to check for:  Crackling sounds in the lungs.  Decreased breath sounds.  A rapid heartbeat. Various tests may be ordered. These may include:  Chest X-ray.  CT scan.  Swallowing study. This test looks at how food is swallowed and  whether it goes into your breathing tube (trachea) or food pipe (esophagus).  Sputum culture. Saliva and mucus (sputum) are collected from the lungs or the tubes that carry air to the lungs (bronchi). The sputum is then tested for bacteria.  Bronchoscopy. This test uses a flexible tube (bronchoscope) to see inside the lungs. How is this treated? Treatment will usually include antibiotic medicines. Other medicines may also be used to reduce fever or pain. You may need to be treated in the hospital. In the hospital, your breathing will be carefully monitored. Depending on how well you are breathing, you may need to be given oxygen, or you may need breathing support from a breathing machine (ventilator). For people who fail a swallowing study, a feeding tube might be placed in the stomach, or they may be asked to avoid certain food textures or liquids when they eat. Follow these instructions at home:  Carefully follow any special eating instructions you were given, such as avoiding certain food textures or thickening liquids. This reduces the risk of developing aspiration pneumonia again.  Only take over-the-counter or prescription medicines as directed by your health care provider. Follow the directions carefully.  If you were prescribed antibiotics, take them as directed. Finish them even if you start to feel better.  Rest as instructed by your health care provider.  Keep all follow-up appointments with your health care provider. Contact a health care provider if:  You develop worsening shortness of breath, wheezing, or difficulty  breathing.  You develop a fever.  You have chest pain. This information is not intended to replace advice given to you by your health care provider. Make sure you discuss any questions you have with your health care provider. Document Released: 03/26/2009 Document Revised: 11/10/2015 Document Reviewed: 11/14/2012 Elsevier Interactive Patient Education  2017  [employer].    We recommend that you schedule a mammogram for breast cancer screening. Typically, you do not need a referral to do this. Please contact a local imaging center to schedule your mammogram.  [facility] - [phone]  *ask for the Radiology Department [facility] - [phone] or [phone]  [facility] - [phone] [place] [phone] [facility] - [phone]  *ask for the [facility] - [phone]  *ask for the Radiology Department [facility] - [phone]  *ask for the [place] - [phone]  IF you received an x-ray today, you will receive an invoice from [facility]. Please contact [facility] at [phone] with questions or concerns regarding your invoice.   IF you received labwork today, you will receive an invoice from [facility]. Please contact [facility] at [phone] with questions or concerns regarding your invoice.   Our billing staff will not be able to assist you with questions regarding bills from these companies.  You will be contacted with the lab results as soon as they are available. The fastest way to get your results is to activate your My Chart account. Instructions are located on the last page of this paperwork. If you have not heard from [place] regarding the results in 2 weeks, please contact this office.      I personally performed the services described in this documentation, which was scribed in my presence. The recorded information has been reviewed and considered for accuracy and completeness, addended by me as needed, and agree with information above.  Signed,   Merri Ray, MD Primary Care at [place].  11/08/16 11:16 AM

## 2016-11-20 ENCOUNTER — Telehealth: Payer: Self-pay | Admitting: Family Medicine

## 2016-11-20 NOTE — Telephone Encounter (Signed)
Pt calling stating that she still have a cough and yellowish mucus and feel like if she could have a few more days of levaquin then she should feel better

## 2016-11-21 NOTE — Telephone Encounter (Signed)
Patient called, she still has a cough, and she think if is possible to have another few more days of Levaquin, then she should feel better.

## 2016-11-22 MED ORDER — LEVOFLOXACIN 500 MG PO TABS: 500.0000 mg | ORAL_TABLET | Freq: Every day | ORAL | 0 refills | Status: DC

## 2016-11-22 NOTE — Telephone Encounter (Signed)
Left message on voicemail that Levaquin was extended for another 4 days and that she does need to return to the clinic in the next couple days if she is not feeling better. Advised that if she was feeling better that she did not need to follow up for another 4weeks to repeat chest xray. Advised patient to call back with any questions.

## 2016-11-22 NOTE — Telephone Encounter (Signed)
I extended her Levaquin for 4 more days which would be typical max dosing for pneumonia. However I did want her to follow-up within 2-3 days from onset of treatment which was on May 30. Has she seen another provider? If not,  should recheck within the next few days if not significantly better. If improved, then can follow up in next 4 weeks for repeat xray.

## 2017-07-16 IMAGING — NM NM MISC PROCEDURE
6 series · 36 of 36 positions shown · non-contrast
Comparison: none

[Series 1: wbr rest · 6.40mm/px · 6 of 64 frames shown]
[frame 6/64]
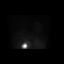
[frame 16/64]
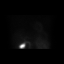
[frame 27/64]
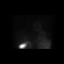
[frame 38/64]
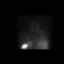
[frame 48/64]
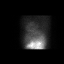
[frame 59/64]
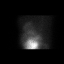

[Series 1: wbr_r-proj_st wbr rest · 6.40mm/px · 6 of 64 frames shown]
[frame 6/64]
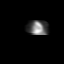
[frame 16/64]
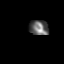
[frame 27/64]
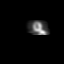
[frame 38/64]
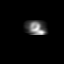
[frame 48/64]
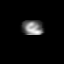
[frame 59/64]
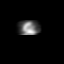

[Series 2: wbr_s-proj_st wbr stress-gsp · 6.40mm/px · 6 of 512 frames shown]
[frame 43/512]
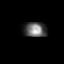
[frame 128/512]
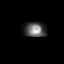
[frame 214/512]
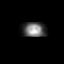
[frame 299/512]
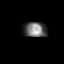
[frame 384/512]
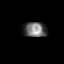
[frame 470/512]
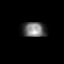

[Series 2: wbr stress-gsp · 6.40mm/px · 6 of 512 frames shown]
[frame 43/512]
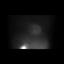
[frame 128/512]
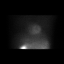
[frame 214/512]
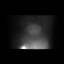
[frame 299/512]
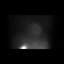
[frame 384/512]
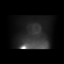
[frame 470/512]
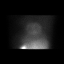

[Series 3: wbr stress-sum-em · 6.40mm/px · 6 of 64 frames shown]
[frame 6/64]
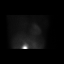
[frame 16/64]
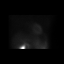
[frame 27/64]
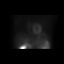
[frame 38/64]
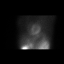
[frame 48/64]
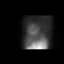
[frame 59/64]
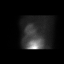

[Series 3: wbr_s-proj_st wbr stress-sum-em · 6.40mm/px · 6 of 64 frames shown]
[frame 6/64]
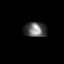
[frame 16/64]
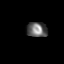
[frame 27/64]
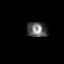
[frame 38/64]
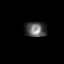
[frame 48/64]
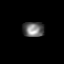
[frame 59/64]
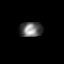

[36 of 36 positions shown; findings below may reference images not displayed]

Canned report from images found in remote index.

Refer to host system for actual result text.

## 2017-07-27 ENCOUNTER — Inpatient Hospital Stay: Admission: RE | Admit: 2017-07-27 | Payer: Managed Care, Other (non HMO) | Source: Ambulatory Visit

## 2017-08-24 ENCOUNTER — Ambulatory Visit: Payer: Self-pay

## 2017-08-24 NOTE — Telephone Encounter (Signed)
Would also recommend evaluation as soon as possible. Emergency department certainly reasonable with chest pain and dyspnea symptoms. If she refuses ED, at minimum would recommend she be seen tomorrow. I am not in the office tomorrow so can see any provider.

## 2017-08-24 NOTE — Telephone Encounter (Signed)
Patient called in with c/o "right chest pain." She says "it's pain in my right lung. It hurts in the right chest near my shoulder blade. It does not radiate and it is constant at a 6." I asked about onset, she says "about 2 days ago." I asked about other symptoms, she says "I have been coughing and sweating at night." I asked does it get worse with exertion, deep breathing, she says "I can't say it gets worse with exertion, but it is worse with deep breathing." According to protocol, go to ED. I advised her to go to ED, she said "I am laying here in the bed not feeling like doing anything and I will keep laying here instead of going to the ED. I will not go there and sit all day. Don't you have an x-ray machine there that I can come get an x-ray?" I advised that it would be better to go to the ED. However, I will send this information to the provider and he will make a decision and someone will call you with the recommendation. She verbalized understanding.  Reason for Disposition . Taking a deep breath makes pain worse  Answer Assessment - Initial Assessment Questions 1. LOCATION: "Where does it hurt?"       Right near shoulder blade 2. RADIATION: "Does the pain go anywhere else?" (e.g., into neck, jaw, arms, back)     No 3. ONSET: "When did the chest pain begin?" (Minutes, hours or days)      A couple of days ago 4. PATTERN "Does the pain come and go, or has it been constant since it started?"  "Does it get worse with exertion?"      Constant, hasn't noticed it getting worse 5. DURATION: "How long does it last" (e.g., seconds, minutes, hours)     Constant 6. SEVERITY: "How bad is the pain?"  (e.g., Scale 1-10; mild, moderate, or severe)    - MILD (1-3): doesn't interfere with normal activities     - MODERATE (4-7): interferes with normal activities or awakens from sleep    - SEVERE (8-10): excruciating pain, unable to do any normal activities       6 7. CARDIAC RISK FACTORS: "Do you have any  history of heart problems or risk factors for heart disease?" (e.g., prior heart attack, angina; high blood pressure, diabetes, being overweight, high cholesterol, smoking, or strong family history of heart disease)     No 8. PULMONARY RISK FACTORS: "Do you have any history of lung disease?"  (e.g., blood clots in lung, asthma, emphysema, birth control pills)    Take hormone pills 9. CAUSE: "What do you think is causing the chest pain?"     I don't know 10. OTHER SYMPTOMS: "Do you have any other symptoms?" (e.g., dizziness, nausea, vomiting, sweating, fever, difficulty breathing, cough)       Cough, sweating at night 11. PREGNANCY: "Is there any chance you are pregnant?" "When was your last menstrual period?"       No  Protocols used: CHEST PAIN-A-AH

## 2017-08-27 ENCOUNTER — Other Ambulatory Visit (INDEPENDENT_AMBULATORY_CARE_PROVIDER_SITE_OTHER): Payer: 59

## 2017-08-27 ENCOUNTER — Ambulatory Visit (INDEPENDENT_AMBULATORY_CARE_PROVIDER_SITE_OTHER): Payer: 59 | Admitting: Emergency Medicine

## 2017-08-27 ENCOUNTER — Encounter: Payer: Self-pay | Admitting: Emergency Medicine

## 2017-08-27 DIAGNOSIS — R0789 Other chest pain: Secondary | ICD-10-CM

## 2017-08-27 DIAGNOSIS — R05 Cough: Secondary | ICD-10-CM | POA: Diagnosis not present

## 2017-08-27 DIAGNOSIS — R053 Chronic cough: Secondary | ICD-10-CM

## 2017-08-27 DIAGNOSIS — J3489 Other specified disorders of nose and nasal sinuses: Secondary | ICD-10-CM

## 2017-08-27 DIAGNOSIS — R059 Cough, unspecified: Secondary | ICD-10-CM

## 2017-08-27 LAB — BASIC METABOLIC PANEL
BUN: 22 mg/dL (ref 6–23)
CO2: 31 mEq/L (ref 19–32)
CREATININE: 0.78 mg/dL (ref 0.40–1.20)
Calcium: 9 mg/dL (ref 8.4–10.5)
Chloride: 102 mEq/L (ref 96–112)
GFR: 79.62 mL/min (ref >60.00)
Glucose, Bld: 98 mg/dL (ref 70–99)
Potassium: 4.7 mEq/L (ref 3.5–5.1)
Sodium: 138 mEq/L (ref 135–145)

## 2017-08-27 NOTE — Patient Instructions (Addendum)
We will perform a CT scan of your chest with contrast.  This will allow [place] to follow your prior pulmonary nodules and also evaluate for possible causes of chest pain. We will perform a CT scan of your sinuses as originally planned by Dr. Redmond Baseman.  I will then refer you back to see Dr. Redmond Baseman to review the results and talk about your symptoms. Consider restarting loratadine 10 mg daily as well as fluticasone nasal spray 2 sprays each nostril once a day.  These may help her nasal obstruction and also some of your cough symptoms. Follow with Dr Lamonte Sakai next available to review your CT scans together.

## 2017-08-27 NOTE — Assessment & Plan Note (Signed)
Nasal sinus obstruction.  She still has problems with this.  On my exam there may be a scab on the left lateral aspect of her nare but I do not see any other obstruction.  Dr. Redmond Baseman had wanted a CT scan of her sinuses but she did'nt ever get it done.  I will order this now and send her back to followed up with him.

## 2017-08-27 NOTE — Assessment & Plan Note (Signed)
For the most part stable.  I asked her to try restarting her nasal steroid and loratadine.

## 2017-08-27 NOTE — Addendum Note (Signed)
Addended by: Desmond Dike C on: 08/27/2017 02:35 PM   Modules accepted: Orders

## 2017-08-27 NOTE — Assessment & Plan Note (Signed)
Somewhat pleuritic in nature.  I suspect that this is related to her chronic cough.  I do not feel any focal musculoskeletal pressure points.  Believe she needs a CT scan of her chest to ensure no evidence for pulmonary embolism.  She needed a CT anyway to follow her pulmonary nodules.

## 2017-08-27 NOTE — Addendum Note (Signed)
Addended by: Desmond Dike C on: 08/27/2017 02:24 PM   Modules accepted: Orders

## 2017-08-27 NOTE — Progress Notes (Addendum)
Subjective:    Patient ID: Michaela Hall, female    DOB: Mar 01, 1956, 62 y.o.   MRN: [id number]   HPI 62 yo woman, former smoker (15 pk-yrs) with hx chronic pain, HA, DJD, anxiety/depression. She is a self-referral for evaluation of dyspnea and cough. She reports that she had been well until about Fall '15. At that time she developed dry cough, especially at night. Had some nasal drainage, voice changes. She has never really been completely better since. She has been treated on a few occasions with pred and abx without any change. She saw ENT in July, was started on protonix bid. She has am nausea, vomiting. Sweats at night. Also on gabapentin, but not for cough, for pain. She has intermittent acute dyspnea. Has not been at baseline breathing status since last year. She has a mold exposure in her home up until July when she moved elsewhere.   ROV 07/23/15 -- follow-up visit for dyspnea and cough. Her last visit we empirically started Protonix and an allergy regimen (fluticasone and loratadine). Given her history of mold exposure and small tobacco exposure we also formed pulmonary function testing and a CT scan of the chest both of which I have personally reviewed. Chest CT showed two to 3 mm nodules, one in the left upper lobe and another in the anterior right lower lobe that are likely benign but which will need to be followed. Her pulmonary function testing shows normal spirometry and normal diffusion capacity. There is no bronchodilator response. She was unable to do lung volumes effectively and no data was generated. She tells me that her cough is unchanged. She has gastroparesis - has emesis almost every day that is influencing her UA irritation. She does describe some R upper back pain with exertion that corresponds with her dyspnea.   ROV 07/27/16 -- This is a follow-up visit for evaluation of dyspnea and cough. She has gastroparesis and emesis. Chronic cough associated with this. She has been  treated with allergy regimen and PPI. CT chest done today shows that 2 small 64mm nodules that appear stable in size compared with 05/31/2015. She denies any rhinitis. She feels a full ness in her L maxillary sinus, ? A nasal obstruction.   Acute OV 08/27/17 --patient has a history of dyspnea and chronic cough.  This in the setting of gastroparesis and frequent emesis.  She also has small pulmonary nodules that we have been following with serial CT scans of the chest.  I last saw her approximately 1 year ago.  She returns today for an acute visit reporting that she was well until about 6 days ago, then she developed headache, some night sweats, an increase in her cough and also some pain R mid back, pleuritic in nature. Not sure about fever. Some nasal congestion but no real change from her baseline. Some yellow mucous from her nasal passages and her chest. She still feels a Left nasal obstruction - she saw Dr Redmond Baseman before.    Review of Systems  Constitutional: Positive for appetite change and unexpected weight change. Negative for fever.  HENT: Positive for dental problem. Negative for congestion, ear pain, nosebleeds, postnasal drip, rhinorrhea, sinus pressure, sneezing, sore throat and trouble swallowing.   Eyes: Negative for redness and itching.  Respiratory: Positive for cough and shortness of breath. Negative for chest tightness and wheezing.   Cardiovascular: Negative for palpitations and leg swelling.  Gastrointestinal: Negative for nausea and vomiting.       Acid  Heartburn  Genitourinary: Negative for dysuria.  Musculoskeletal: Negative for joint swelling.  Skin: Negative for rash.  Neurological: Negative for headaches.  Hematological: Does not bruise/bleed easily.  Psychiatric/Behavioral: Negative for dysphoric mood. The patient is not nervous/anxious.     Past Medical History:  Diagnosis Date  . Anxiety   . Cervical spondylosis without myelopathy 10/21/2015  . Chronic pain    back  and right hip  . Complication of anesthesia   . Depression   . DJD (degenerative joint disease)   . Headache(784.0)   . PONV (postoperative nausea and vomiting)   . Weakness of left arm 10/21/2015     Family History  Problem Relation Age of Onset  . Anesthesia problems Mother   . Cancer Mother        ovary/uterus & breast  . Hyperlipidemia Mother   . Hypertension Mother   . Breast cancer Maternal Aunt   . Ovarian cancer Maternal Grandmother   . Hyperlipidemia Father   . Hypertension Father      Social History   Socioeconomic History  . Marital status: Married    Spouse name: Not on file  . Number of children: 3  . Years of education: Not on file  . Highest education level: Not on file  Social Needs  . Financial resource strain: Not on file  . Food insecurity - worry: Not on file  . Food insecurity - inability: Not on file  . Transportation needs - medical: Not on file  . Transportation needs - non-medical: Not on file  Occupational History  . Occupation: Medicare ins. Scientist, clinical (histocompatibility and immunogenetics): [employer]  Tobacco Use  . Smoking status: Former Smoker    Packs/day: 0.50    Years: 20.00    Pack years: 10.00    Types: Cigarettes    Last attempt to quit: 06/12/2014    Years since quitting: 3.2  . Smokeless tobacco: Never Used  Substance and Sexual Activity  . Alcohol use: No    Alcohol/week: 0.0 oz  . Drug use: No  . Sexual activity: Not on file  Other Topics Concern  . Not on file  Social History Narrative   Lives at home w/ her husband   Right-handed   Very little caffeine use, drinks 7up   Last colon screening: 09/2011     No Known Allergies   Outpatient Medications Prior to Visit  Medication Sig Dispense Refill  . estradiol (ESTRACE) 1 MG tablet Take 1 tablet by mouth daily.    Marland Kitchen ipratropium-albuterol (DUONEB) 0.5-2.5 (3) MG/3ML SOLN Take 3 mLs by nebulization.    . ondansetron (ZOFRAN-ODT) 8 MG disintegrating tablet Take 1 tablet by mouth 3 (three)  times daily. Take 1 tab by mouth tid    . Oxycodone HCl 10 MG TABS Take 10 mg by mouth 2 (two) times daily as needed.  0  . progesterone (PROMETRIUM) 200 MG capsule     . VIVELLE-DOT 0.05 MG/24HR patch Place 1 patch onto the skin 2 (two) times a week. Place 1 patch onto the skin 2 (two) times a week.  3  . clonazePAM (KLONOPIN) 1 MG tablet Take 1 mg by mouth.    . levofloxacin (LEVAQUIN) 500 MG tablet Take 1 tablet (500 mg total) by mouth daily. 4 tablet 0   No facility-administered medications prior to visit.          Objective:   Physical Exam  Vitals:   08/27/17 1400 08/27/17 1401  BP:  92/60  Pulse:  78  SpO2:  99%  Weight: 112 lb (50.8 kg)   Height: 5\' 2"  (1.575 m)   Gen: Pleasant, thin woman, in no distress,  normal affect  ENT: No lesions,  mouth clear,  oropharynx clear, no postnasal drip, there appears to be an area in the left lateral nare with some scab and slight bleeding.  I do not see an overt obstructing lesion  Neck: No JVD, no stridor  Lungs: No use of accessory muscles,  clear without rales or rhonchi  Cardiovascular: RRR, heart sounds normal, no murmur or gallops, no peripheral edema  Musculoskeletal: No deformities, no cyanosis or clubbing.  No point tenderness on palpation of her back in the area where her pleuritic pain is located  Neuro: alert, non focal  Skin: Warm, no lesions or rashes      Assessment & Plan:  Atypical chest pain Somewhat pleuritic in nature.  I suspect that this is related to her chronic cough.  I do not feel any focal musculoskeletal pressure points.  Believe she needs a CT scan of her chest to ensure no evidence for pulmonary embolism.  She needed a CT anyway to follow her pulmonary nodules.  Nasal obstruction Nasal sinus obstruction.  She still has problems with this.  On my exam there may be a scab on the left lateral aspect of her nare but I do not see any other obstruction.  Dr. Redmond Baseman had wanted a CT scan of her sinuses  but she did'nt ever get it done.  I will order this now and send her back to followed up with him.  Chronic cough For the most part stable.  I asked her to try restarting her nasal steroid and loratadine.  Baltazar Apo, MD, PhD 08/27/2017, 2:22 PM [facility] [phone] or if no answer [phone]

## 2017-08-28 ENCOUNTER — Inpatient Hospital Stay: Admission: RE | Admit: 2017-08-28 | Payer: 59 | Source: Ambulatory Visit

## 2017-08-28 ENCOUNTER — Ambulatory Visit (INDEPENDENT_AMBULATORY_CARE_PROVIDER_SITE_OTHER)
Admission: RE | Admit: 2017-08-28 | Discharge: 2017-08-28 | Disposition: A | Discharge status: Home / Self Care | Payer: 59 | Source: Ambulatory Visit | Attending: Emergency Medicine | Admitting: Emergency Medicine

## 2017-08-28 DIAGNOSIS — R0789 Other chest pain: Secondary | ICD-10-CM | POA: Diagnosis not present

## 2017-08-28 MED ORDER — IOPAMIDOL (ISOVUE-370) INJECTION 76%
80.0000 mL | Freq: Once | INTRAVENOUS | Status: AC | PRN
  Administered 2017-08-28: 80 mL via INTRAVENOUS

## 2017-08-29 ENCOUNTER — Ambulatory Visit (INDEPENDENT_AMBULATORY_CARE_PROVIDER_SITE_OTHER)
Admission: RE | Admit: 2017-08-29 | Discharge: 2017-08-29 | Disposition: A | Discharge status: Home / Self Care | Payer: 59 | Source: Ambulatory Visit | Attending: Emergency Medicine | Admitting: Emergency Medicine

## 2017-08-29 DIAGNOSIS — J3489 Other specified disorders of nose and nasal sinuses: Secondary | ICD-10-CM | POA: Diagnosis not present

## 2017-09-04 ENCOUNTER — Telehealth: Payer: Self-pay | Admitting: Cardiovascular Disease

## 2017-09-04 NOTE — Telephone Encounter (Signed)
Returned call to patient. She is inquiring about her CT angio chest and CT maxillofacial studies, ordered by Dr. Lamonte Sakai but done at [facility]. Advised patient she should contact Dr. Agustina Caroli office for results, since Dr. Claiborne Billings did not order the tests. She voiced understanding.

## 2017-09-04 NOTE — Telephone Encounter (Signed)
New message:    Pt is calling for results of CT Scan

## 2017-09-06 ENCOUNTER — Telehealth: Payer: Self-pay | Admitting: Emergency Medicine

## 2017-09-06 NOTE — Telephone Encounter (Signed)
Called and spoke with [place] from [facility]. She states that patient had CT angio on 3.19 and does not want to have the CT on 3.29.19. Erline Levine has cancelled this.   Routing to RB for [employer]

## 2017-09-06 NOTE — Telephone Encounter (Signed)
Yes cancel the 3/29 scan

## 2017-09-06 NOTE — Telephone Encounter (Signed)
Attempted to call Erline Levine but unable to reach her.    Left a detailed message to Erline Levine letting her know we could cancel pt's CT that is currently scheduled 3/29 due to pt recently having one done.  Have received a skype from [place] stating she received my message.  Nothing further needed at this time.

## 2017-09-07 ENCOUNTER — Inpatient Hospital Stay: Admission: RE | Admit: 2017-09-07 | Payer: 59 | Source: Ambulatory Visit

## 2017-09-14 ENCOUNTER — Ambulatory Visit: Payer: 59 | Admitting: Emergency Medicine

## 2017-11-07 ENCOUNTER — Ambulatory Visit: Payer: Managed Care, Other (non HMO) | Admitting: Physician Assistant

## 2017-11-08 ENCOUNTER — Ambulatory Visit: Payer: Self-pay | Admitting: Family Medicine

## 2018-07-17 ENCOUNTER — Other Ambulatory Visit: Payer: Self-pay | Admitting: Physical Medicine and Rehabilitation

## 2018-07-17 DIAGNOSIS — M810 Age-related osteoporosis without current pathological fracture: Secondary | ICD-10-CM

## 2018-08-06 ENCOUNTER — Ambulatory Visit (INDEPENDENT_AMBULATORY_CARE_PROVIDER_SITE_OTHER): Payer: 59 | Admitting: Family Medicine

## 2018-08-06 ENCOUNTER — Encounter: Payer: Self-pay | Admitting: Family Medicine

## 2018-08-06 VITALS — BP 108/62 | HR 85 | Temp 98.6°F | Ht 62.0 in | Wt 127.6 lb

## 2018-08-06 DIAGNOSIS — F411 Generalized anxiety disorder: Secondary | ICD-10-CM

## 2018-08-06 DIAGNOSIS — M25551 Pain in right hip: Secondary | ICD-10-CM

## 2018-08-06 DIAGNOSIS — G4700 Insomnia, unspecified: Secondary | ICD-10-CM

## 2018-08-06 DIAGNOSIS — K3184 Gastroparesis: Secondary | ICD-10-CM | POA: Insufficient documentation

## 2018-08-06 DIAGNOSIS — Z01818 Encounter for other preprocedural examination: Secondary | ICD-10-CM | POA: Diagnosis not present

## 2018-08-06 DIAGNOSIS — M542 Cervicalgia: Secondary | ICD-10-CM

## 2018-08-06 DIAGNOSIS — G8929 Other chronic pain: Secondary | ICD-10-CM

## 2018-08-06 DIAGNOSIS — M549 Dorsalgia, unspecified: Secondary | ICD-10-CM

## 2018-08-06 DIAGNOSIS — Z780 Asymptomatic menopausal state: Secondary | ICD-10-CM

## 2018-08-06 DIAGNOSIS — R918 Other nonspecific abnormal finding of lung field: Secondary | ICD-10-CM

## 2018-08-06 NOTE — Assessment & Plan Note (Signed)
Stable.  Continue diet modifications and Zofran as needed.

## 2018-08-06 NOTE — Progress Notes (Signed)
Chief Complaint:  Michaela Hall is a 63 y.o. female who presents today with a chief complaint of right hip pain and to establish care  Assessment/Plan:   Right hip pain No red flags.  Continue management per orthopedics.  She is overall low cardiovascular risk.  Will check CBC, C met, A1c, and urinalysis to complete her preoperative evaluation.  Depending on results of blood work, she will be cleared for surgery.  Pulmonary nodules Continue management per pulmonology.  Menopause Stable.  Continue management per gynecology.  Insomnia Stable.  Continue Remeron 15 mg nightly.  Gastroparesis Stable.  Continue diet modifications and Zofran as needed.  Chronic neck and back pain secondary to degenerative disc disease Stable.  Continue management per Dr. Nelva Bush.  Anxiety state Stable.  Continue Remeron 15 mg nightly.  Preventative Healthcare Patient was instructed to return soon for CPE.  Obtain records from OB/GYN and previous PCP. Health Maintenance Due  Topic Date Due  . Hepatitis C Screening  Sep [birth year], 1955  . HIV Screening  12/13/1970  . TETANUS/TDAP  12/13/1974  . PAP SMEAR-Modifier  01/14/2011  . COLONOSCOPY  10/03/2016  . MAMMOGRAM  11/23/2017     Subjective:  HPI:  Right Hip Pain Chronic problem.  Several year history.  Is currently under the care of Dr. Wynelle Link.  She will be undergoing hip replacement next few weeks.  Today she would like to have surgical clearance.  She has tried several therapies in the past including steroid injections with no significant improvement.  Her pain is due to underlying degenerative joint disease with a torn labrum.  She has excellent functional status and is able to climb a flight of stairs without any shortness of breath.  Her stable, chronic medical conditions are outlined below:  % Chronic Back and Neck Pain - Follows with spine specialist - Dr Nelva Bush - ROS: No reported weakness or numbness  % Gastroparesis - Diet  controlled - On zofran as needed  % Insomnia / Anxiety - On remeron 67m and doing well  % Pulmonary Nodule - Follows with Dr BLamonte Sakai % Menopausal SWallacewith Dr PPosey Prontoin HLillyand progesterone  ROS: Per HPI, otherwise a complete review of systems was negative.   PMH:  The following were reviewed and entered/updated in epic: Past Medical History:  Diagnosis Date  . Anxiety   . Cervical spondylosis without myelopathy 10/21/2015  . Cervical spondylosis without myelopathy 10/21/2015  . Chronic pain    back and right hip  . Complication of anesthesia   . Depression   . DJD (degenerative joint disease)   . Headache(784.0)   . PONV (postoperative nausea and vomiting)   . Weakness of left arm 10/21/2015   Patient Active Problem List   Diagnosis Date Noted  . Chronic neck and back pain secondary to degenerative disc disease 08/06/2018  . Gastroparesis 08/06/2018  . Right hip pain 08/06/2018  . History of tobacco abuse 08/05/2015  . Pulmonary nodules 07/23/2015  . Anxiety state 08/12/2012  . Insomnia 08/12/2012  . Menopause 08/12/2012   Past Surgical History:  Procedure Laterality Date  . BACK SURGERY    . ESOPHAGOGASTRODUODENOSCOPY  09/28/2011   Procedure: ESOPHAGOGASTRODUODENOSCOPY (EGD);  Surgeon: JWinfield Cunas, MD;  [facility];  Service: Endoscopy;  Laterality: N/A;  . HIP SURGERY    . KNEE SURGERY    . SPINE SURGERY    . TONSILLECTOMY      Family History  Problem Relation Age of Onset  . Anesthesia problems Mother   . Cancer Mother        ovary/uterus & breast  . Hyperlipidemia Mother   . Hypertension Mother   . Hyperlipidemia Father   . Hypertension Father   . Breast cancer Maternal Aunt   . Ovarian cancer Maternal Grandmother     Medications- reviewed and updated Current Outpatient Medications  Medication Sig Dispense Refill  . estradiol (ESTRACE) 1 MG tablet Take 1 tablet by mouth daily.    . mirtazapine  (REMERON) 15 MG tablet Take 15 mg by mouth at bedtime.    . ondansetron (ZOFRAN-ODT) 8 MG disintegrating tablet Take 1 tablet by mouth 3 (three) times daily. Take 1 tab by mouth tid    . progesterone (PROMETRIUM) 200 MG capsule      No current facility-administered medications for this visit.     Allergies-reviewed and updated No Known Allergies  Social History   Socioeconomic History  . Marital status: Married    Spouse name: Not on file  . Number of children: 3  . Years of education: Not on file  . Highest education level: Not on file  Occupational History  . Occupation: Medicare ins. 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 Financial resource strain: Not on file  . Food insecurity:    Worry: Not on file    Inability: Not on file  . Transportation needs:    Medical: Not on file    Non-medical: Not on file  Tobacco Use  . Smoking status: Former Smoker    Packs/day: 0.50    Years: 20.00    Pack years: 10.00    Types: Cigarettes    Last attempt to quit: 06/12/2014    Years since quitting: 4.1  . Smokeless tobacco: Never Used  Substance and Sexual Activity  . Alcohol use: No    Alcohol/week: 0.0 standard drinks  . Drug use: No  . Sexual activity: Not on file  Lifestyle  . Physical activity:    Days per week: Not on file    Minutes per session: Not on file  . Stress: Not on file  Relationships  . Social connections:    Talks on phone: Not on file    Gets together: Not on file    Attends religious service: Not on file    Active member of club or organization: Not on file    Attends meetings of clubs or organizations: Not on file    Relationship status: Not on file  Other Topics Concern  . Not on file  Social History Narrative   Lives at home w/ her husband   Right-handed   Very little caffeine use, drinks 7up   Last colon screening: 09/2011         Objective:  Physical Exam: BP 108/62 (BP Location: Left Arm, Patient Position: Sitting, Cuff  Size: Normal)   Pulse 85   Temp 98.6 F (37 C) (Oral)   Ht _0  (1.575 m)   Wt 127 lb 9.6 oz (57.9 kg)   SpO2 98%   BMI 23.34 kg/m   Gen: NAD, resting comfortably CV: Regular rate and rhythm with no murmurs appreciated Pulm: Normal work of breathing, clear to auscultation bilaterally with no crackles, wheezes, or rhonchi GI: Normal bowel sounds present. Soft, Nontender, Nondistended. MSK: No edema, cyanosis, or clubbing noted Skin: Warm, dry Neuro: Grossly normal, moves all extremities Psych: Normal affect and thought content  EKG:  NSR with no ischemic changes.       [name]. [name], MD 08/06/2018 4:52 PM

## 2018-08-06 NOTE — Assessment & Plan Note (Signed)
Stable.  Continue Remeron 15 mg nightly. 

## 2018-08-06 NOTE — Assessment & Plan Note (Signed)
No red flags.  Continue management per orthopedics.  She is overall low cardiovascular risk.  Will check CBC, C met, A1c, and urinalysis to complete her preoperative evaluation.  Depending on results of blood work, she will be cleared for surgery.

## 2018-08-06 NOTE — Assessment & Plan Note (Signed)
Stable.  Continue management per Dr. Nelva Bush.

## 2018-08-06 NOTE — Assessment & Plan Note (Signed)
Stable.  Continue management per gynecology. 

## 2018-08-06 NOTE — Assessment & Plan Note (Signed)
Continue management per pulmonology. 

## 2018-10-14 ENCOUNTER — Ambulatory Visit: Payer: Self-pay

## 2018-10-14 ENCOUNTER — Encounter: Payer: Self-pay | Admitting: Family Medicine

## 2018-10-14 ENCOUNTER — Ambulatory Visit (INDEPENDENT_AMBULATORY_CARE_PROVIDER_SITE_OTHER): Payer: 59 | Admitting: Family Medicine

## 2018-10-14 VITALS — BP 118/84 | HR 103 | Temp 100.0°F

## 2018-10-14 DIAGNOSIS — R238 Other skin changes: Secondary | ICD-10-CM | POA: Diagnosis not present

## 2018-10-14 DIAGNOSIS — R030 Elevated blood-pressure reading, without diagnosis of hypertension: Secondary | ICD-10-CM | POA: Diagnosis not present

## 2018-10-14 DIAGNOSIS — R42 Dizziness and giddiness: Secondary | ICD-10-CM | POA: Diagnosis not present

## 2018-10-14 MED ORDER — DIAZEPAM 10 MG PO TABS: 10.0000 mg | ORAL_TABLET | Freq: Two times a day (BID) | ORAL | 1 refills | Status: DC | PRN

## 2018-10-14 MED ORDER — MECLIZINE HCL 25 MG PO TABS: 25.0000 mg | ORAL_TABLET | Freq: Three times a day (TID) | ORAL | 0 refills | Status: DC | PRN

## 2018-10-14 NOTE — Telephone Encounter (Signed)
Incoming call from Patient, with a complaint of Severe Vertigo and sore throat.  Patient states the room is spinning ans feels very light headed. p of 100.5, complains of Severe vertigo.  Denies  Congestion and pressure in the ear.  Onset was Friday.  Aggravating factors is standing.  Other Sx is standing and vomiting.  Sore throat onset was Friday also.  Rated  Moderate to Severe.  Denies exposure to Strep.  Reports  Dry cough.  Fever 100.00 last night Fever 100.5  Reports throat is firey red. Reviewed protocol and provided care advice Patient voiced understanding.  Transferred Patient to [place] office for to [place] for scheduling.     Reason for Disposition . [1] MODERATE dizziness (e.g., vertigo; feels very unsteady, interferes with normal activities) AND [2] has NOT been evaluated by physician for this . [1] MODERATE dizziness (e.g., interferes with normal activities) AND [2] has NOT been evaluated by physician for this  (Exception: dizziness caused by heat exposure, sudden standing, or poor fluid intake)  Answer Assessment - Initial Assessment Questions 1. DESCRIPTION: "Describe your dizziness."     2. VERTIGO: "Do you feel like either you or the room is spinning or tilting?"     Spinning 3. LIGHTHEADED: "Do you feel lightheaded?" (e.g., somewhat faint, woozy, weak upon standing)     Light headed 4. SEVERITY: "How bad is it?"  "Can you walk?"   - MILD - Feels unsteady but walking normally.   - MODERATE - Feels very unsteady when walking, but not falling; interferes with normal activities (e.g., school, work) .   - SEVERE - Unable to walk without falling (requires assistance).    severve 5. ONSET:  "When did the dizziness begin?"     Friday 6. AGGRAVATING FACTORS: "Does anything make it worse?" (e.g., standing, change in head position)     standing 7. CAUSE: "What do you think is causing the dizziness?"     *No Answer* 8. RECURRENT SYMPTOM: "Have you had dizziness before?" If so,  ask: "When was the last time?" "What happened that time?"     denies 9. OTHER SYMPTOMS: "Do you have any other symptoms?" (e.g., headache, weakness, numbness, vomiting, earache)     Headache constant, vomiting 10. PREGNANCY: "Is there any chance you are pregnant?" "When was your last menstrual period?"       na  Answer Assessment - Initial Assessment Questions 1. ONSET: "When did the throat start hurting?" (Hours or days ago)      Friday 2. SEVERITY: "How bad is the sore throat?" (Scale 1-10; mild, moderate or severe)   - MILD (1-3):  doesn't interfere with eating or normal activities   - MODERATE (4-7): interferes with eating some solids and normal activities   - SEVERE (8-10):  excruciating pain, interferes with most normal activities   - SEVERE DYSPHAGIA: can't swallow liquids, drooling     Moderate to severe 3. STREP EXPOSURE: "Has there been any exposure to strep within the past week?" If so, ask: "What type of contact occurred?"      denies 4.  VIRAL SYMPTOMS: "Are there any symptoms of a cold, such as a runny nose, cough, hoarse voice or red eyes?"      Cough-  Dry  5. FEVER: "Do you have a fever?" If so, ask: "What is your temperature, how was it measured, and when did it start?"    100.0 this am,  100.5 last night 6. PUS ON THE TONSILS: "Is there  pus on the tonsils in the back of your throat?"    Fire red 7. OTHER SYMPTOMS: "Do you have any other symptoms?" (e.g., difficulty breathing, headache, rash)     deies 8. PREGNANCY: "Is there any chance you are pregnant?" "When was your last menstrual period?"     na  Protocols used: [place], [place], [place]

## 2018-10-14 NOTE — Assessment & Plan Note (Signed)
Patient concerned about possible Raynouds.  Advised her to avoid cold exposure.  She would like to have testing for ANA.  She will come back soon for labs to get this done.  Consider trial of calcium channel blocker in the future.

## 2018-10-14 NOTE — Telephone Encounter (Signed)
Call patient and make virtual visit.

## 2018-10-14 NOTE — Progress Notes (Signed)
    Chief Complaint:  Michaela Hall is a 63 y.o. female who presents today for a virtual office visit with a chief complaint of dizziness.   Assessment/Plan:  Vertigo Seems to be positional in nature, consistent with BPPV.  No red flags or signs/symptoms concerning for stroke or other central process.  Will start meclizine.  Also start valium the next 1 to 2 weeks.  Offered vestibular rehab referral however patient declined.  Discussed reasons to return to care or seek emergent care.  Follow-up as needed.  Elevated blood pressure Likely elevated in setting of acute distress.  Most recent blood pressure was within goal.  Continue home blood pressure monitoring with goal 140/90 or lower  Abnormal skin color Patient concerned about possible Raynouds.  Advised her to avoid cold exposure.  She would like to have testing for ANA.  She will come back soon for labs to get this done.  Consider trial of calcium channel blocker in the future.     Subjective:  HPI:  Dizziness  Symptoms started a few days ago.  Stable over that time.  Reports having a room spinning sensation whenever she moves her head or when she stands up.  Does not have any symptoms with laying still.  She has had some associated sore throat and cough as well.  She has not tried anything for this.  Has no history of vertigo in the past.  She has had some nausea that has led to vomiting due to the vertigo.  No known sick contacts.  No other obvious alleviating or aggravating factors.  Patient had elevated blood pressure reading into the 170s earlier this morning.  She was able to recheck in, to be in the 110s over 80s.  No reported chest pain or shortness of breath.  No reported weakness or numbness.  Patient is also concerned because her fingers turn white during cold.  This is been going on for a few years and she is concerned about ray nods.   ROS: Per HPI  PMH: She reports that she quit smoking about 4 years ago. Her  smoking use included cigarettes. She has a 10.00 pack-year smoking history. She has never used smokeless tobacco. She reports that she does not drink alcohol or use drugs.      Objective/Observations  Physical Exam: BP 118/84 (BP Location: Left Arm, Patient Position: Sitting, Cuff Size: Normal)   Pulse (!) 103   Temp 100 F (37.8 C) (Oral)   Gen: NAD, resting comfortably Pulm: Normal work of breathing Neuro: Grossly normal, moves all extremities. CN2-12 grossly intact Psych: Normal affect and thought content  Virtual Visit via Video   I connected with Maymuna Detzel Selkirk on 10/14/18 at 11:20 AM EDT by a video enabled telemedicine application and verified that I am speaking with the correct person using two identifiers. I discussed the limitations of evaluation and management by telemedicine and the availability of in person appointments. The patient expressed understanding and agreed to proceed.   Patient location: Home Provider location: [place] participating in the virtual visit: Myself and Patient     [name]. [name], MD 10/14/2018 12:11 PM

## 2018-10-14 NOTE — Telephone Encounter (Signed)
The patient was already seen this morning.

## 2018-10-16 ENCOUNTER — Encounter (HOSPITAL_COMMUNITY): Admission: RE | Payer: Self-pay | Source: Home / Self Care

## 2018-10-16 ENCOUNTER — Inpatient Hospital Stay (HOSPITAL_COMMUNITY): Admission: RE | Admit: 2018-10-16 | Payer: 59 | Source: Home / Self Care | Admitting: Orthopedic Surgery

## 2018-10-16 SURGERY — ARTHROPLASTY, HIP, TOTAL, ANTERIOR APPROACH
Anesthesia: Choice | Laterality: Right

## 2019-05-05 ENCOUNTER — Telehealth: Payer: Self-pay | Admitting: Family Medicine

## 2019-05-05 NOTE — Telephone Encounter (Signed)
Please call patient to schedule. Thanks

## 2019-05-05 NOTE — Telephone Encounter (Signed)
I called the patient's husband to schedule his AWV with Loma Sousa.  He requested appointments for both himself and his wife with Dr. [name]. His wife is experiencing various symptoms which they believe could be related to Raynaud's or other autoimmune disorders.  Would like to have blood work.

## 2019-05-12 ENCOUNTER — Ambulatory Visit: Payer: Self-pay

## 2019-05-12 NOTE — Telephone Encounter (Signed)
See note

## 2019-05-12 NOTE — Telephone Encounter (Signed)
Incoming call from Patient with a complaint that her blood Pressure  Was very low last night.  70/40.  Today the blood pressures are 129/72 and 124/75  HR 82 Feel better than last night.  Has a appointment  Tomorrow at 1:00pm. Normally her blood Pressure  is 102/65.  Wants to make sure she does not need to go to Urgent care to today. Will definitely go to schedule appointment to morrow.   Works from home.  Staying home today and resting. Patient states  That she had gastroparesis the past three days            Reason for Disposition . [id number] Fall in systolic BP > 20 mm Hg from normal AND [2] dizzy, lightheaded, or weak  Answer Assessment - Initial Assessment Questions 1. BLOOD PRESSURE: "What is the blood pressure?" "Did you take at least two measurements 5 minutes apart?"129/72  124/75     2. ONSET: "When did you take your blood pressure?"     Last  Night.  3. HOW: "How did you obtain the blood pressure?" (e.g., visiting nurse, automatic home BP monitor)    automatiic 4. HISTORY: "Do you have a history of low blood pressure?" "What is your blood pressure normally?"     yes 5. MEDICATIONS: "Are you taking any medications for blood pressure?" If yes: "Have they been changed recently?"     Denies  6. PULSE RATE: "Do you know what your pulse rate is?"      82 7. OTHER SYMPTOMS: "Have you been sick recently?" "Have you had a recent injury?"    No    8. PREGNANCY: "Is there any chance you are pregnant?" "When was your last menstrual period?"     Na  Protocols used: LOW BLOOD PRESSURE-A-AH

## 2019-05-13 ENCOUNTER — Encounter: Payer: Self-pay | Admitting: Family Medicine

## 2019-05-13 ENCOUNTER — Ambulatory Visit (INDEPENDENT_AMBULATORY_CARE_PROVIDER_SITE_OTHER): Payer: 59 | Admitting: Family Medicine

## 2019-05-13 VITALS — BP 114/60 | HR 89 | Temp 98.7°F | Ht 62.0 in | Wt 131.0 lb

## 2019-05-13 DIAGNOSIS — R238 Other skin changes: Secondary | ICD-10-CM | POA: Diagnosis not present

## 2019-05-13 DIAGNOSIS — R031 Nonspecific low blood-pressure reading: Secondary | ICD-10-CM | POA: Diagnosis not present

## 2019-05-13 DIAGNOSIS — F411 Generalized anxiety disorder: Secondary | ICD-10-CM

## 2019-05-13 DIAGNOSIS — I7300 Raynaud's syndrome without gangrene: Secondary | ICD-10-CM

## 2019-05-13 DIAGNOSIS — L989 Disorder of the skin and subcutaneous tissue, unspecified: Secondary | ICD-10-CM | POA: Diagnosis not present

## 2019-05-13 DIAGNOSIS — G4700 Insomnia, unspecified: Secondary | ICD-10-CM

## 2019-05-13 LAB — SEDIMENTATION RATE: Sed Rate: 18 mm/hr (ref 0–30)

## 2019-05-13 LAB — CBC
HCT: 49.2 % — ABNORMAL HIGH (ref 36.0–46.0)
Hemoglobin: 16.2 g/dL — ABNORMAL HIGH (ref 12.0–15.0)
MCHC: 33 g/dL (ref 30.0–36.0)
MCV: 93.6 fl (ref 78.0–100.0)
Platelets: 195 10*3/uL (ref 150.0–400.0)
RBC: 5.26 Mil/uL — ABNORMAL HIGH (ref 3.87–5.11)
RDW: 13.9 % (ref 11.5–15.5)
WBC: 8.2 10*3/uL (ref 4.0–10.5)

## 2019-05-13 LAB — TSH: TSH: 3.32 u[IU]/mL (ref 0.35–4.50)

## 2019-05-13 LAB — COMPREHENSIVE METABOLIC PANEL
ALT: 16 U/L (ref 0–35)
AST: 19 U/L (ref 0–37)
Albumin: 4.5 g/dL (ref 3.5–5.2)
Alkaline Phosphatase: 83 U/L (ref 39–117)
BUN: 21 mg/dL (ref 6–23)
CO2: 29 mEq/L (ref 19–32)
Calcium: 9.5 mg/dL (ref 8.4–10.5)
Chloride: 103 mEq/L (ref 96–112)
Creatinine, Ser: 0.79 mg/dL (ref 0.40–1.20)
GFR: 73.41 mL/min (ref >60.00)
Glucose, Bld: 101 mg/dL — ABNORMAL HIGH (ref 70–99)
Potassium: 4.4 mEq/L (ref 3.5–5.1)
Sodium: 140 mEq/L (ref 135–145)
Total Bilirubin: 0.5 mg/dL (ref 0.2–1.2)
Total Protein: 6.7 g/dL (ref 6.0–8.3)

## 2019-05-13 LAB — CORTISOL: Cortisol, Plasma: 4.5 ug/dL

## 2019-05-13 LAB — C-REACTIVE PROTEIN: CRP: <1 mg/dL (ref 0.5–20.0)

## 2019-05-13 MED ORDER — CLONAZEPAM 1 MG PO TABS: 1.0000 mg | ORAL_TABLET | Freq: Two times a day (BID) | ORAL | 1 refills | Status: DC

## 2019-05-13 MED ORDER — ZOLPIDEM TARTRATE 10 MG PO TABS: 5.0000 mg | ORAL_TABLET | Freq: Every evening | ORAL | 1 refills | Status: DC | PRN

## 2019-05-13 NOTE — Assessment & Plan Note (Signed)
Refill Ambien 5 to 10 mg at bedtime as needed.  Discussed potential side effects.

## 2019-05-13 NOTE — Progress Notes (Signed)
Chief Complaint:  Michaela Hall is a 63 y.o. female who presents today with a chief complaint of hypotension.   Assessment/Plan:  Insomnia Refill Ambien 5 to 10 mg at bedtime as needed.  Discussed potential side effects.  Anxiety state Stable.  No red flags.  Refill Klonopin 1 mg 3 times daily as needed.  Abnormal skin color Patient started Raynaud's.  Will check labs including CBC, C met, TSH, ANA, rheumatoid factor, ESR, and CRP.  Depending on results may need referral to rheumatology.  Hypotension Likely due to dehydration.  No red flags.  Normal blood pressure readings today.  Encouraged good oral hydration.  Will check labs including CBC, C met, TSH, and cortisol level.  Skin lesion Cryotherapy applied today.  See below procedure note.  She tolerated well.  If cystic lesion recurs, will consider small injection of Depo-Medrol.    Subjective:  HPI:  Hypotension Patient had episode a few days ago where she had low blood pressure readings in the 70s over 40s.  She does have a history of gastroparesis and notes that he she had a flare after eating Thanksgiving dinner 5 days ago.  Reportedly was in bed for 2 to 3 days and unable to keep anything down including fluids.  She feels much better today.  No fevers.  No medication changes.  No loss of consciousness.  Insomnia Patient is also had worsening insomnia.  She has been on Ambien in the past which is worked well for her.  She would like a refill today.  Anxiety Chronic problem.  Worsening.  Is been on Klonopin in the past which is worked well.  Would like refill today.  Has tolerated past well without side effects.  Raynaud's phenomenon Chronic problem.  Seems to be occurring more frequently.  Patient is concerned about possible autoimmune disorder.  Would like to be checked for autoimmune disorders today.  Skin lesion Located on left hand.  Has been present for several months.  She thinks it is a cyst.  Has had  cryotherapy in the past for similar lesions.   ROS: Per HPI  PMH: She reports that she quit smoking about 4 years ago. Her smoking use included cigarettes. She has a 10.00 pack-year smoking history. She has never used smokeless tobacco. She reports that she does not drink alcohol or use drugs.      Objective:  Physical Exam: BP 114/60 (BP Location: Right Arm, Patient Position: Sitting, Cuff Size: Normal)   Pulse 89   Temp 98.7 F (37.1 C) (Temporal)   Ht _0  (1.575 m)   Wt 131 lb (59.4 kg)   SpO2 98%   BMI 23.96 kg/m   Gen: NAD, resting comfortably CV: Regular rate and rhythm with no murmurs appreciated Pulm: Normal work of breathing, clear to auscultation bilaterally with no crackles, wheezes, or rhonchi GI: Normal bowel sounds present. Soft, Nontender, Nondistended. MSK: No edema, cyanosis, or clubbing noted. 2 small lesions on left thumb.  Skin: Warm, dry.  Neuro: Grossly normal, moves all extremities Psych: Normal affect and thought content  Cryotherapy Procedure Note  Pre-operative Diagnosis: Suspicious lesion  Locations: Left Hand  Indications: Therapeutic  Procedure Details  Patient informed of risks (permanent scarring, infection, light or dark discoloration, bleeding, infection, weakness, numbness and recurrence of the lesion) and benefits of the procedure and verbal informed consent obtained.  The areas are treated with liquid nitrogen therapy, frozen until ice ball extended 2 mm beyond lesion, allowed to thaw, and  treated again. The patient tolerated procedure well.  The patient was instructed on post-op care, warned that there may be blister formation, redness and pain. Recommend OTC analgesia as needed for pain.  Condition: Stable  Complications: none.         [name]. [name], MD 05/13/2019 2:30 PM

## 2019-05-13 NOTE — Telephone Encounter (Signed)
Patient has appointment today

## 2019-05-13 NOTE — Assessment & Plan Note (Signed)
Stable.  No red flags.  Refill Klonopin 1 mg 3 times daily as needed.

## 2019-05-13 NOTE — Assessment & Plan Note (Signed)
Patient started Raynaud's.  Will check labs including CBC, C met, TSH, ANA, rheumatoid factor, ESR, and CRP.  Depending on results may need referral to rheumatology.

## 2019-05-13 NOTE — Patient Instructions (Signed)
It was very nice to see you today!  We froze the spots on your hands today.  We will check blood work.  I will send in ambien and refill your clonazepam.  We may need to refer you to see a rheumatologist depending on the results of your blood work.   Take care, Dr [name]  Please try these tips to maintain a healthy lifestyle:   Eat at least 3 REAL meals and 1-2 snacks per day.  Aim for no more than 5 hours between eating.  If you eat breakfast, please do so within one hour of getting up.    Obtain twice as many fruits/vegetables as protein or carbohydrate foods for both lunch and dinner. (Half of each meal should be fruits/vegetables, one quarter protein, and one quarter starchy carbs)   Cut down on sweet beverages. This includes juice, soda, and sweet tea.    Exercise at least 150 minutes every week.

## 2019-05-15 LAB — RHEUMATOID FACTOR: Rhuematoid fact SerPl-aCnc: <14 IU/mL (ref <14)

## 2019-05-15 LAB — ANA: Anti Nuclear Antibody (ANA): NEGATIVE

## 2019-05-15 NOTE — Progress Notes (Signed)
Please inform patient of the following:  No clear explanation for her symptoms based on her labs - recommend rheumatology referral.  Michaela Hall. [name], MD 05/15/2019 3:49 PM

## 2019-05-16 ENCOUNTER — Other Ambulatory Visit: Payer: Self-pay

## 2019-05-16 ENCOUNTER — Telehealth: Payer: Self-pay | Admitting: Family Medicine

## 2019-05-16 DIAGNOSIS — I7300 Raynaud's syndrome without gangrene: Secondary | ICD-10-CM

## 2019-05-16 NOTE — Telephone Encounter (Signed)
Patient notified

## 2019-05-16 NOTE — Telephone Encounter (Signed)
See note  Copied from [place] [id number]. Topic: General - Inquiry >> May 15, 2019  5:18 PM Michaela Hall wrote: Reason for CRM: patient returning call from office for lab results

## 2019-05-16 NOTE — Telephone Encounter (Signed)
See note

## 2019-05-16 NOTE — Telephone Encounter (Signed)
Pt returned vm for lab results. Please return call.

## 2019-07-11 ENCOUNTER — Telehealth: Payer: Self-pay

## 2019-07-11 ENCOUNTER — Telehealth: Payer: Self-pay | Admitting: Family Medicine

## 2019-07-11 NOTE — Telephone Encounter (Signed)
Please advise 

## 2019-07-11 NOTE — Telephone Encounter (Signed)
Patient calls in this afternoon, asking for suppository she said her chronic illness has been giving her trouble and she has been vomiting since 6 am. Spoke with [facility] and she suggested patient triaged or go to urgent care. Patient states she is not able to go to urgent care

## 2019-07-11 NOTE — Telephone Encounter (Signed)
Incoming call from patient.  She has been vomiting since early am today.  She reports that she has h/o gastroparesis and the vomiting is not uncommon for her.  She denies any fever, chills, or other symptoms.  States that she is very fatigued and feels very "ill"   I strongly advised patient to go to Urgent Care for evaluation and she declined.  She is requesting an rx for Phenergan suppositories be sent in to her pharmacy.  Gave red flag precautions for ED/Urgent Care for dehydration; patient verbalizes understanding.

## 2019-07-12 NOTE — Telephone Encounter (Signed)
I agree with Urgent care disposition or Saturday clinic. Will forward to PCP as [name]

## 2019-07-14 NOTE — Telephone Encounter (Signed)
Left detailed message for patient asking how she is doing and if a appt is needed.

## 2019-07-14 NOTE — Telephone Encounter (Signed)
Please call and check on patient. Offer visit today if she is still not feeling well.  [name]. [name], MD 07/14/2019 7:58 AM

## 2020-07-23 ENCOUNTER — Other Ambulatory Visit: Payer: Self-pay

## 2020-07-23 ENCOUNTER — Emergency Department (HOSPITAL_COMMUNITY): Payer: No Typology Code available for payment source

## 2020-07-23 ENCOUNTER — Emergency Department (HOSPITAL_COMMUNITY)
Admission: EM | Admit: 2020-07-23 | Discharge: 2020-07-23 | Disposition: A | Discharge status: Home / Self Care | Payer: No Typology Code available for payment source | Attending: Emergency Medicine | Admitting: Emergency Medicine

## 2020-07-23 ENCOUNTER — Encounter (HOSPITAL_COMMUNITY): Payer: Self-pay

## 2020-07-23 DIAGNOSIS — R031 Nonspecific low blood-pressure reading: Secondary | ICD-10-CM | POA: Insufficient documentation

## 2020-07-23 DIAGNOSIS — Z87891 Personal history of nicotine dependence: Secondary | ICD-10-CM | POA: Diagnosis not present

## 2020-07-23 DIAGNOSIS — R0789 Other chest pain: Secondary | ICD-10-CM | POA: Insufficient documentation

## 2020-07-23 DIAGNOSIS — R079 Chest pain, unspecified: Secondary | ICD-10-CM

## 2020-07-23 LAB — CBC
HCT: 43.2 % (ref 36.0–46.0)
Hemoglobin: 14.1 g/dL (ref 12.0–15.0)
MCH: 31.1 pg (ref 26.0–34.0)
MCHC: 32.6 g/dL (ref 30.0–36.0)
MCV: 95.2 fL (ref 80.0–100.0)
Platelets: 185 10*3/uL (ref 150–400)
RBC: 4.54 MIL/uL (ref 3.87–5.11)
RDW: 13.2 % (ref 11.5–15.5)
WBC: 11.9 10*3/uL — ABNORMAL HIGH (ref 4.0–10.5)
nRBC: 0 % (ref 0.0–0.2)

## 2020-07-23 LAB — BASIC METABOLIC PANEL
Anion gap: 9 (ref 5–15)
BUN: 20 mg/dL (ref 8–23)
CO2: 26 mmol/L (ref 22–32)
Calcium: 9.6 mg/dL (ref 8.9–10.3)
Chloride: 106 mmol/L (ref 98–111)
Creatinine, Ser: 1 mg/dL (ref 0.44–1.00)
GFR, Estimated: >60 mL/min (ref >60)
Glucose, Bld: 98 mg/dL (ref 70–99)
Potassium: 3.6 mmol/L (ref 3.5–5.1)
Sodium: 141 mmol/L (ref 135–145)

## 2020-07-23 LAB — TROPONIN I (HIGH SENSITIVITY)
Troponin I (High Sensitivity): 7 ng/L (ref <18)
Troponin I (High Sensitivity): 5 ng/L (ref <18)

## 2020-07-23 NOTE — ED Provider Notes (Signed)
[facility] EMERGENCY DEPARTMENT Provider Note   CSN: [id number] Arrival date & time: 07/23/20  0042     History Chief Complaint  Patient presents with  . Chest Pain    Michaela Hall is a 65 y.o. female.  The history is provided by the patient and medical records.  Chest Pain  65 year old female with history of anxiety, chronic back pain, depression, degenerative disc disease, presenting to the ED with chest pain.  States this woke her from sleep, felt like a sharp, stabbing pain but then a squeezing sensation around her chest all at the same time.  States she began to feel short of breath, checked her blood pressure and noted that it was low.  States symptoms have been intermittent since onset, states they are not consistent and sensations feel differently with episodes.  She states she has been under increased stress with her job due to some employees quitting.  States she has not slept in almost 3 days due to stress and her mind racing.  Past Medical History:  Diagnosis Date  . Anxiety   . Cervical spondylosis without myelopathy 10/21/2015  . Cervical spondylosis without myelopathy 10/21/2015  . Chronic pain    back and right hip  . Complication of anesthesia   . Depression   . DJD (degenerative joint disease)   . Headache(784.0)   . PONV (postoperative nausea and vomiting)   . Weakness of left arm 10/21/2015    Patient Active Problem List   Diagnosis Date Noted  . Abnormal skin color 10/14/2018  . Chronic neck and back pain secondary to degenerative disc disease 08/06/2018  . Gastroparesis 08/06/2018  . Right hip pain 08/06/2018  . History of tobacco abuse 08/05/2015  . Pulmonary nodules 07/23/2015  . Anxiety state 08/12/2012  . Insomnia 08/12/2012  . Menopause 08/12/2012    Past Surgical History:  Procedure Laterality Date  . BACK SURGERY    . ESOPHAGOGASTRODUODENOSCOPY  09/28/2011   Procedure: ESOPHAGOGASTRODUODENOSCOPY (EGD);  Surgeon:  Winfield Cunas., MD;  Location: [facility];  Service: Endoscopy;  Laterality: N/A;  . HIP SURGERY    . KNEE SURGERY    . SPINE SURGERY    . TONSILLECTOMY       OB History   No obstetric history on file.     Family History  Problem Relation Age of Onset  . Anesthesia problems Mother   . Cancer Mother        ovary/uterus & breast  . Hyperlipidemia Mother   . Hypertension Mother   . Hyperlipidemia Father   . Hypertension Father   . Breast cancer Maternal Aunt   . Ovarian cancer Maternal Grandmother     Social History   Tobacco Use  . Smoking status: Former Smoker    Packs/day: 0.50    Years: 20.00    Pack years: 10.00    Types: Cigarettes    Quit date: 06/12/2014    Years since quitting: 6.1  . Smokeless tobacco: Never Used  Substance Use Topics  . Alcohol use: No    Alcohol/week: 0.0 standard drinks  . Drug use: No    Home Medications Prior to Admission medications   Medication Sig Start Date End Date Taking? Authorizing Provider  clonazePAM (KLONOPIN) 1 MG tablet Take 1 tablet (1 mg total) by mouth 2 (two) times daily. 05/13/19   Vivi Barrack, MD  estradiol (ESTRACE) 1 MG tablet Take 1 tablet by mouth daily. 06/08/17   [provider]  gabapentin (NEURONTIN) 300 MG capsule Take 300 mg by mouth 3 (three) times daily. 04/28/19   [provider]  meclizine (ANTIVERT) 25 MG tablet Take 1 tablet (25 mg total) by mouth 3 (three) times daily as needed for dizziness. 10/14/18   Vivi Barrack, MD  ondansetron (ZOFRAN-ODT) 8 MG disintegrating tablet Take 1 tablet by mouth 3 (three) times daily. Take 1 tab by mouth tid 08/03/15   [provider]  progesterone (PROMETRIUM) 200 MG capsule  08/13/15   [provider]  zolpidem (AMBIEN) 10 MG tablet Take 0.5-1 tablets (5-10 mg total) by mouth at bedtime as needed for sleep. 05/13/19 06/12/19  Vivi Barrack, MD    Allergies    Patient has no known allergies.  Review of Systems   Review  of Systems  Cardiovascular: Positive for chest pain.  All other systems reviewed and are negative.   Physical Exam Updated Vital Signs BP 100/62 (BP Location: Right Arm)   Pulse 80   Temp 98 F (36.7 C) (Oral)   Resp 18   SpO2 97%   Physical Exam Vitals and nursing note reviewed.  Constitutional:      Appearance: She is well-developed and well-nourished.  HENT:     Head: Normocephalic and atraumatic.     Mouth/Throat:     Mouth: Oropharynx is clear and moist.  Eyes:     Extraocular Movements: EOM normal.     Conjunctiva/sclera: Conjunctivae normal.     Pupils: Pupils are equal, round, and reactive to light.  Cardiovascular:     Rate and Rhythm: Normal rate and regular rhythm.     Heart sounds: Normal heart sounds.  Pulmonary:     Effort: Pulmonary effort is normal.     Breath sounds: Normal breath sounds. No decreased breath sounds, wheezing or rhonchi.  Abdominal:     General: Bowel sounds are normal.     Palpations: Abdomen is soft.  Musculoskeletal:        General: Normal range of motion.     Cervical back: Normal range of motion.  Skin:    General: Skin is warm and dry.  Neurological:     Mental Status: She is alert and oriented to person, place, and time.  Psychiatric:        Mood and Affect: Mood and affect normal.     ED Results / Procedures / Treatments   Labs (all labs ordered are listed, but only abnormal results are displayed) Labs Reviewed  CBC - Abnormal; Notable for the following components:      Result Value   WBC 11.9 (*)    All other components within normal limits  BASIC METABOLIC PANEL  TROPONIN I (HIGH SENSITIVITY)  TROPONIN I (HIGH SENSITIVITY)    EKG None  Radiology DG Chest 2 View  Result Date: 07/23/2020 CLINICAL DATA:  Chest pain EXAM: CHEST - 2 VIEW COMPARISON:  10/09/2016 FINDINGS: Heart and mediastinal contours are within normal limits. No focal opacities or effusions. No acute bony abnormality. IMPRESSION: No active  cardiopulmonary disease. Electronically Signed   By: Rolm Baptise M.D.   On: 07/23/2020 01:29    Procedures Procedures   Medications Ordered in ED Medications - No data to display  ED Course  I have reviewed the triage vital signs and the nursing notes.  Pertinent labs & imaging results that were available during my care of the patient were reviewed by me and considered in my medical decision making (see chart for  details).    MDM Rules/Calculators/A&P  65 year old female presenting to the ED with chest pain.  States this woke her from sleep and felt like a sharp stabbing sensation but also like a squeezing sensation around her entire chest.  States symptoms have been intermittent since onset and are not consistent, often changing in character.  She does report a lot of increased stress at work and has not slept for about 3 days due to some anxiety and her mind racing.  She is awake, alert, oriented here.  She does not appear overly anxious at the moment.  EKG is nonischemic.  Chest x-ray without acute findings.  Initial labs reassuring, troponin negative.  Patient monitored here, no acute events.  Delta trop remains negative.  Suspect symptoms likely related to stress/anxiety.  She feels better and is reassuring with negative work-up.  BP soft but this appears to be her baseline when compared with prior values.  MAP has been stable at 70.  Feel she is stable for discharge home.  Close follow-up with PCP encouraged, along with rest over the weekend.  She will return here for any new/acute changes.  Final Clinical Impression(s) / ED Diagnoses Final diagnoses:  Chest pain in adult    Rx / DC Orders ED Discharge Orders    None       Larene Pickett, PA-C 07/23/20 0516    Maudie Flakes, MD 07/23/20 [phone]

## 2020-07-23 NOTE — ED Triage Notes (Signed)
EMS reports pt has had increased stress at work and home. Woke up at substantial chest pain intermittent and describes as cramping.

## 2020-07-23 NOTE — ED Notes (Signed)
Received message from Dr. Sedonia Small. and no new orders at this time.

## 2020-07-23 NOTE — Discharge Instructions (Signed)
Cardiac work-up today was normal. I would try to rest and take it easy over the next few days. Follow-up with your primary care doctor. Return here for new concerns.

## 2020-07-23 NOTE — ED Notes (Signed)
Sent Dr. Sedonia Small a secure chat advising of Pt's blood pressures. Last pressure 93/58.

## 2020-08-27 ENCOUNTER — Telehealth (INDEPENDENT_AMBULATORY_CARE_PROVIDER_SITE_OTHER): Payer: No Typology Code available for payment source | Admitting: Family Medicine

## 2020-08-27 VITALS — Ht 62.0 in | Wt 122.0 lb

## 2020-08-27 DIAGNOSIS — G4700 Insomnia, unspecified: Secondary | ICD-10-CM | POA: Diagnosis not present

## 2020-08-27 DIAGNOSIS — F411 Generalized anxiety disorder: Secondary | ICD-10-CM | POA: Diagnosis not present

## 2020-08-27 MED ORDER — CLONAZEPAM 1 MG PO TABS: 1.0000 mg | ORAL_TABLET | Freq: Two times a day (BID) | ORAL | 5 refills | Status: DC

## 2020-08-27 NOTE — Progress Notes (Signed)
   MAIAH SINNING is a 65 y.o. female who presents today for a telephone visit.  Assessment/Plan:  Chronic Problems Addressed Today: Anxiety state Patient has been under a lot of stress recently related to things going on at work.  She is also been dealing with her husband who has been ill and her son who has unexplained medical issues and is undergoing diagnostic evaluation.  She took Xanax several years ago, however more recently she has been on clonazepam in the past and has done well with this.  She has not been on it for several months.  We will refill today.  She will follow-up with me via MyChart in a couple weeks.     Insomnia Hopefully will improve the situation insomnia as below.     Subjective:  HPI:  See A/p.         Objective/Observations   See A/p.   Telephone Visit   I connected with Aren Cherne Halterman on 08/27/20 at  4:00 PM EDT via telephone and verified that I am speaking with the correct person using two identifiers. I discussed the limitations of evaluation and management by telemedicine and the availability of in person appointments. The patient expressed understanding and agreed to proceed.   Patient location: Home Provider location: [place] participating in the virtual visit: Myself and Patient  A total of 18 minutes were spent on medical discussion.      [name]. [name], MD 08/27/2020 4:24 PM

## 2020-08-27 NOTE — Assessment & Plan Note (Signed)
Patient has been under a lot of stress recently related to things going on at work.  She is also been dealing with her husband who has been ill and her son who has unexplained medical issues and is undergoing diagnostic evaluation.  She took Xanax several years ago, however more recently she has been on clonazepam in the past and has done well with this.  She has not been on it for several months.  We will refill today.  She will follow-up with me via MyChart in a couple weeks.

## 2020-08-27 NOTE — Assessment & Plan Note (Signed)
Hopefully will improve the situation insomnia as below.

## 2020-12-14 ENCOUNTER — Telehealth: Payer: Self-pay

## 2020-12-14 NOTE — Telephone Encounter (Signed)
Pt requesting refill for Clonazepam 1mg  tablet. Last OV 08/2020.

## 2020-12-14 NOTE — Telephone Encounter (Signed)
LAST APPOINTMENT DATE: 08/27/20  NEXT APPOINTMENT DATE:@Visit  date not found  MEDICATION:clonazePAM (KLONOPIN) 1 MG tablet  PHARMACY:[id number] - [place], [place] - [address]  Pt would like medicine to be filled tomorrow if possible

## 2020-12-14 NOTE — Telephone Encounter (Signed)
Spoke to pt told her Dr. [name] filled medication in March with 5 refills, so you should have refills left. Pt verbalized understanding and asked what do I do? Told pt to contact her pharmacy and let them know you need a refill and they should fill it. Pt verbalized understanding.

## 2020-12-15 NOTE — Telephone Encounter (Signed)
Patient states pharmacy Is needing approval from dr.parker -spoke with stella and she said she will call pharmacy to send approval   Patient would also like to say thank you [phone] - pharmacy #

## 2021-02-21 ENCOUNTER — Telehealth: Payer: Self-pay | Admitting: *Deleted

## 2021-02-21 NOTE — Telephone Encounter (Signed)
Left message to return call to our office at their convenience.   Patient need OV for preoperative evaluation  Last OV 05/13/2019

## 2021-02-22 ENCOUNTER — Encounter (HOSPITAL_COMMUNITY): Payer: Self-pay

## 2021-03-02 ENCOUNTER — Encounter: Payer: Self-pay | Admitting: Internal Medicine

## 2021-03-02 ENCOUNTER — Ambulatory Visit (INDEPENDENT_AMBULATORY_CARE_PROVIDER_SITE_OTHER): Payer: Medicare Other | Admitting: Internal Medicine

## 2021-03-02 ENCOUNTER — Ambulatory Visit (INDEPENDENT_AMBULATORY_CARE_PROVIDER_SITE_OTHER): Payer: Medicare Other

## 2021-03-02 ENCOUNTER — Other Ambulatory Visit: Payer: Self-pay

## 2021-03-02 VITALS — BP 152/92 | HR 85 | Ht 62.0 in | Wt 119.0 lb

## 2021-03-02 DIAGNOSIS — F411 Generalized anxiety disorder: Secondary | ICD-10-CM

## 2021-03-02 DIAGNOSIS — Z780 Asymptomatic menopausal state: Secondary | ICD-10-CM

## 2021-03-02 DIAGNOSIS — R55 Syncope and collapse: Secondary | ICD-10-CM

## 2021-03-02 NOTE — Progress Notes (Signed)
New Outpatient Visit Date: 03/02/2021  Referring Provider: Vivi Barrack, MD [address] [place],  [place] [address]  Chief Complaint: Low blood pressure, dizziness, and passing out  HPI:  Michaela Hall is a 65 y.o. female who is being seen today as a self-referral for evaluation of passing out and low blood pressure episodes. She has a history of Raynaud's phenomenon, gastroparesis, osteoarthritis, chronic back and hip pain, anxiety, and depression.  She was seen in 2017 by Dr. Claiborne Billings in her [facility] office, at which time she complained of shortness of breath with minimal activity after having been essentially bedridden for 7 months.  She also noticed occasional chest discomfort.  Myocardial perfusion stress test at that time was low risk without ischemia or scar.  Echo showed preserved LVEF with grade 2 diastolic dysfunction.  Today, Michaela Hall is concerned about 6 episodes of passing out over the last 6 months.  She has also had multiple episodes of lightheadedness with near syncope.  In most cases, the episodes occur when she wakes up at night.  Sometimes she is still lying down when she begins to feel lightheaded and see stars.  There is no obvious precipitants that she can identify.  She feels off and feels clammy and nauseated shortly after the lightheadedness begins.  She has been told in the past that issues with her vagus nerve are contributing to her gastroparesis.  Michaela Hall denies chest pain, shortness of breath, and edema.  Up until 6 months ago, she did not have a history of syncope.  She takes most of her medications at night before going to bed but does not report any additions/changes when her symptoms began 6 months ago.  --------------------------------------------------------------------------------------------------  Cardiovascular History & Procedures: Cardiovascular Problems: Syncope Raynaud's phenomenon  Risk Factors: Prior tobacco use and age >=  46  Cath/PCI: None  CV Surgery: None  EP Procedures and Devices: None  Non-Invasive Evaluation(s): TTE (08/16/2015): Normal LV size.  LVEF 60-65% with normal wall motion.  Grade 2 diastolic dysfunction noted.  Normal RV size and function.  No significant valvular abnormality observed. Pharmacologic MPI (08/13/2015): Low risk study without ischemia or scar.  LVEF 55-65%.  Recent CV Pertinent Labs: Lab Results  Component Value Date   INR 0.97 08/08/2010   K 4.5 03/02/2021   MG 2.3 03/02/2021   BUN 20 03/02/2021   CREATININE 0.91 03/02/2021   CREATININE 0.88 12/25/2014    --------------------------------------------------------------------------------------------------  Past Medical History:  Diagnosis Date   Anxiety    Cervical spondylosis without myelopathy 10/21/2015   Cervical spondylosis without myelopathy 10/21/2015   Chronic pain    back and right hip   Complication of anesthesia    Depression    DJD (degenerative joint disease)    Headache(784.0)    PONV (postoperative nausea and vomiting)    Raynaud's disease    Weakness of left arm 10/21/2015    Past Surgical History:  Procedure Laterality Date   BACK SURGERY     ESOPHAGOGASTRODUODENOSCOPY  09/28/2011   Procedure: ESOPHAGOGASTRODUODENOSCOPY (EGD);  Surgeon: Winfield Cunas., MD;  Location: [facility];  Service: Endoscopy;  Laterality: N/A;   HIP SURGERY     KNEE SURGERY     SPINE SURGERY     TONSILLECTOMY      Current Meds  Medication Sig   clonazePAM (KLONOPIN) 1 MG tablet Take 1 tablet (1 mg total) by mouth 2 (two) times daily. (Patient taking differently: Take 1 mg by mouth 2 (two) times  daily as needed for anxiety.)   diazepam (VALIUM) 5 MG tablet Take 5 mg by mouth daily as needed.   estradiol (ESTRACE) 1 MG tablet Take 1 tablet by mouth daily.   gabapentin (NEURONTIN) 300 MG capsule Take 300 mg by mouth 3 (three) times daily.   mirtazapine (REMERON) 15 MG tablet Take 15 mg by mouth at bedtime.    ondansetron (ZOFRAN-ODT) 8 MG disintegrating tablet Take 1 tablet by mouth 3 (three) times daily. Take 1 tab by mouth tid   Oxycodone HCl 10 MG TABS Take 10 mg by mouth every 4 (four) hours as needed (pain).   [DISCONTINUED] zolpidem (AMBIEN) 10 MG tablet Take 0.5-1 tablets (5-10 mg total) by mouth at bedtime as needed for sleep.    Allergies: Patient has no known allergies.  Social History   Tobacco Use   Smoking status: Former    Packs/day: 0.50    Years: 20.00    Pack years: 10.00    Types: Cigarettes    Quit date: 06/12/2014    Years since quitting: 6.7   Smokeless tobacco: Never  Vaping Use   Vaping Use: Some days   Devices: does not include nicotine  Substance Use Topics   Alcohol use: No    Alcohol/week: 0.0 standard drinks   Drug use: No    Family History  Problem Relation Age of Onset   Anesthesia problems Mother    Cancer Mother        ovary/uterus & breast   Hyperlipidemia Mother    Hypertension Mother    Subarachnoid hemorrhage Mother    Hyperlipidemia Father    Hypertension Father    Brain cancer Father    Breast cancer Maternal Aunt    Ovarian cancer Maternal Grandmother     Review of Systems: A 12-system review of systems was performed and was negative except as noted in the HPI.  --------------------------------------------------------------------------------------------------  Physical Exam: BP (!) 152/92 (BP Location: Right Arm, Patient Position: Sitting, Cuff Size: Normal)   Pulse 85   Ht 5\' 2"  (1.575 m)   Wt 119 lb (54 kg)   SpO2 97%   BMI 21.77 kg/m   General: NAD. HEENT: No conjunctival pallor or scleral icterus. Facemask in place. Neck: Supple without lymphadenopathy, thyromegaly, JVD, or HJR. No carotid bruit. Lungs: Normal work of breathing. Clear to auscultation bilaterally without wheezes or crackles. Heart: Regular rate and rhythm without murmurs, rubs, or gallops. Non-displaced PMI. Abd: Bowel sounds present. Soft, NT/ND  without hepatosplenomegaly Ext: No lower extremity edema. Radial, PT, and DP pulses are 2+ bilaterally Skin: Warm and dry without rash. Neuro: CNIII-XII intact. Strength and fine-touch sensation intact in upper and lower extremities bilaterally. Psych: Normal mood and affect.  EKG: Normal sinus rhythm without abnormality.  Lab Results  Component Value Date   WBC 7.6 03/02/2021   HGB 15.9 03/02/2021   HCT 48.1 (H) 03/02/2021   MCV 90 03/02/2021   PLT 220 03/02/2021    Lab Results  Component Value Date   NA 141 03/02/2021   K 4.5 03/02/2021   CL 102 03/02/2021   CO2 26 03/02/2021   BUN 20 03/02/2021   CREATININE 0.91 03/02/2021   GLUCOSE 97 03/02/2021   ALT 6 03/02/2021    --------------------------------------------------------------------------------------------------  ASSESSMENT AND PLAN: Syncope: Ms. Herne reports multiple episodes of syncope or near syncope over the last month.  Notably, most episodes occur at night when she wakes up.  She has some associated nausea and diaphoresis suggesting a possible  vasovagal mechanism.  There are no other prodromal symptoms.  Physical exam today (including orthostatic vital signs) as well as EKG are unremarkable.  Specifically, her QT interval is normal (she routinely takes ondansetron at bedtime).  I have recommended that we obtain an echocardiogram, carotid Dopplers, and ambulatory cardiac telemetry x14 days.  I will also check a CBC, CMP, magnesium level, and TSH today.  If work-up is unrevealing and symptoms persist, ischemia evaluation (ideally with coronary CTA) and EP consultation will need to be considered.  If symptoms persist despite all of this, adjustment of potential sedating and blood pressure lowering medications may need to be considered.  I have encouraged Ms. balding to stay well-hydrated.  I have advised Ms. Fischetti to abstain from driving and operating heavy machinery for at least 6 months from the time of her most  recent syncopal episode or until reversible cause of her syncope has been identified and adequately treated.  Follow-up: Return to clinic in 6 weeks.  Nelva Bush, MD 03/04/2021 4:55 PM

## 2021-03-02 NOTE — Patient Instructions (Addendum)
Medication Instructions:   Your physician recommends that you continue on your current medications as directed. Please refer to the Current Medication list given to you today.  *If you need a refill on your cardiac medications before your next appointment, please call your pharmacy*   Lab Work:  Today: CBC, CMET, Magnesium, TSH  If you have labs (blood work) drawn today and your tests are completely normal, you will receive your results only by: [place] (if you have MyChart) OR A paper copy in the mail If you have any lab test that is abnormal or we need to change your treatment, we will call you to review the results.   Testing/Procedures:  1) Your physician has requested that you have a carotid duplex. This test is an ultrasound of the carotid arteries in your neck. It looks at blood flow through these arteries that supply the brain with blood. Allow one hour for this exam. There are no restrictions or special instructions.  2) Your physician has requested that you have an echocardiogram. Echocardiography is a painless test that uses sound waves to create images of your heart. It provides your doctor with information about the size and shape of your heart and how well your heart's chambers and valves are working. This procedure takes approximately one hour. There are no restrictions for this procedure.  3) Your physician has recommended that you wear a Live Zio AT monitor for TWO WEEKS.   This monitor is a medical device that records the heart's electrical activity. Doctors most often use these monitors to diagnose arrhythmias. Arrhythmias are problems with the speed or rhythm of the heartbeat. The monitor is a small device applied to your chest. You can wear one while you do your normal daily activities. While wearing this monitor if you have any symptoms to push the button and record what you felt. Once you have worn this monitor for the period of time provider prescribed  (Usually 14 days), you will return the monitor device in the postage paid box. Once it is returned they will download the data collected and provide [place] with a report which the provider will then review and we will call you with those results. Important tips:  Avoid showering during the first 24 hours of wearing the monitor. Avoid excessive sweating to help maximize wear time. Do not submerge the device, no hot tubs, and no swimming pools. Keep any lotions or oils away from the patch. After 24 hours you may shower with the patch on. Take brief showers with your back facing the shower head.  Do not remove patch once it has been placed because that will interrupt data and decrease adhesive wear time. Push the button when you have any symptoms and write down what you were feeling. Once you have completed wearing your monitor, remove and place into box which has postage paid and place in your outgoing mailbox.  If for some reason you have misplaced your box then call our office and we can provide another box and/or mail it off for you.    Follow-Up: At [facility], you and your health needs are our priority.  As part of our continuing mission to provide you with exceptional heart care, we have created designated Provider Care Teams.  These Care Teams include your primary Cardiologist (physician) and Advanced Practice Providers (APPs -  Physician Assistants and Nurse Practitioners) who all work together to provide you with the care you need, when you need it.  We  recommend signing up for the patient portal called "MyChart".  Sign up information is provided on this After Visit Summary.  MyChart is used to connect with patients for Virtual Visits (Telemedicine).  Patients are able to view lab/test results, encounter notes, upcoming appointments, etc.  Non-urgent messages can be sent to your provider as well.   To learn more about what you can do with MyChart, go to [email or web address].    Your  next appointment:   6 week(s)  The format for your next appointment:   In Person  Provider:   You may see Dr. Harrell Gave End or one of the following Advanced Practice Providers on your designated Care Team:   Murray Hodgkins, NP Christell Faith, PA-C Marrianne Mood, PA-C Cadence Kathlen Mody, [place]   Other Instructions  Your physician recommends that you do not drive until we have completed work up.

## 2021-03-03 LAB — CBC
Hematocrit: 48.1 % — ABNORMAL HIGH (ref 34.0–46.6)
Hemoglobin: 15.9 g/dL (ref 11.1–15.9)
MCH: 29.8 pg (ref 26.6–33.0)
MCHC: 33.1 g/dL (ref 31.5–35.7)
MCV: 90 fL (ref 79–97)
Platelets: 220 10*3/uL (ref 150–450)
RBC: 5.34 x10E6/uL — ABNORMAL HIGH (ref 3.77–5.28)
RDW: 14.1 % (ref 11.7–15.4)
WBC: 7.6 10*3/uL (ref 3.4–10.8)

## 2021-03-03 LAB — MAGNESIUM: Magnesium: 2.3 mg/dL (ref 1.6–2.3)

## 2021-03-03 LAB — COMPREHENSIVE METABOLIC PANEL
ALT: 6 IU/L (ref 0–32)
AST: 17 IU/L (ref 0–40)
Albumin/Globulin Ratio: 2.1 (ref 1.2–2.2)
Albumin: 4.6 g/dL (ref 3.8–4.8)
Alkaline Phosphatase: 103 IU/L (ref 44–121)
BUN/Creatinine Ratio: 22 (ref 12–28)
BUN: 20 mg/dL (ref 8–27)
Bilirubin Total: 0.3 mg/dL (ref 0.0–1.2)
CO2: 26 mmol/L (ref 20–29)
Calcium: 9.6 mg/dL (ref 8.7–10.3)
Chloride: 102 mmol/L (ref 96–106)
Creatinine, Ser: 0.91 mg/dL (ref 0.57–1.00)
Globulin, Total: 2.2 g/dL (ref 1.5–4.5)
Glucose: 97 mg/dL (ref 65–99)
Potassium: 4.5 mmol/L (ref 3.5–5.2)
Sodium: 141 mmol/L (ref 134–144)
Total Protein: 6.8 g/dL (ref 6.0–8.5)
eGFR: 70 mL/min/{1.73_m2} (ref >59)

## 2021-03-03 LAB — TSH: TSH: 11.5 u[IU]/mL — ABNORMAL HIGH (ref 0.450–4.500)

## 2021-03-04 ENCOUNTER — Encounter: Payer: Self-pay | Admitting: Internal Medicine

## 2021-03-07 ENCOUNTER — Telehealth: Payer: Self-pay

## 2021-03-07 ENCOUNTER — Telehealth: Payer: Self-pay | Admitting: Internal Medicine

## 2021-03-07 NOTE — Telephone Encounter (Signed)
To Dr. Saunders Revel to review as I am unclear as to what medication she is referring to.

## 2021-03-07 NOTE — Telephone Encounter (Signed)
Can we have her come in for an appointment? It is not typically to treat a single elevated TSH value. She will need additional testing. We can do this here or I can refer her to see an endocrinologist.  [name]. [name], MD 03/07/2021 2:35 PM

## 2021-03-07 NOTE — Telephone Encounter (Signed)
Patient calling States that her PCP is refusing to prescribe the new medication - does not feel that the test result is enough information  Patient would like to be referred to an endocrinologist in [facility] ASAP  Please call to discuss

## 2021-03-07 NOTE — Telephone Encounter (Signed)
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 Michaela Hall is leaving town today at 2 pm and is really concerned about not having the medication on her trip.

## 2021-03-07 NOTE — Telephone Encounter (Signed)
Give patient information, patient agreed with F/U appointment will be in town next week Ok to schedule appointment and lvm with appt information on her voice mail

## 2021-03-07 NOTE — Telephone Encounter (Signed)
See note

## 2021-03-08 ENCOUNTER — Telehealth: Payer: Self-pay | Admitting: Internal Medicine

## 2021-03-08 NOTE — Telephone Encounter (Signed)
Was unable to reach out to Mrs. Horseman, f/u on her concern. Did LDM from Dr. Darnelle Bos response. (DPR approved), Pt needs to refer to Dr. [name]. PCP.   Typically, levothyroxine is used to treat hypothyroidism.  Based on his telephone note, it appears that Dr. [name] recommended that he see Ms. Messerschmidt in the office for further testing and/or referral to an endocrinologist.  I think it would be best for Ms. Mix to follow-up with Dr. [name] or have him refer her to an endocrinologist.  Thanks.   Nelva Bush, MD [place] may call back with any concerns or questions.

## 2021-03-08 NOTE — Telephone Encounter (Signed)
Typically, levothyroxine is used to treat hypothyroidism.  Based on his telephone note, it appears that Dr. [name] recommended that he see Ms. Ober in the office for further testing and/or referral to an endocrinologist.  I think it would be best for Ms. Heffernan to follow-up with Dr. [name] or have him refer her to an endocrinologist.  Thanks.  Nelva Bush, MD [facility]

## 2021-03-08 NOTE — Telephone Encounter (Signed)
Patient spouse came by office States that she dropped off forms to be reviewed and completed by Dr End at last visit 09/21 and was checking on status Please call to discuss

## 2021-03-10 NOTE — Telephone Encounter (Signed)
I cannot locate any forms in Dr. Darnelle Bos office, on Megan's cart, or in nurse bin up front.   Will forward to Dr. Enid Derry to see if they recall any forms for this patient.

## 2021-03-10 NOTE — Telephone Encounter (Signed)
Nelva Bush, MD [phone])  SentAnnie Sable  3:01 PM  To: Emily Filbert, RN  Cc: Jeannette How          Message  Forms are currently with [name], as it is unclear what I need to complete.  Most information may need to come from the billing department.  I will CC Izora Gala to see if she has any updates.  Thanks.    Gerald Stabs

## 2021-03-11 NOTE — Telephone Encounter (Signed)
[name], Glass blower/designer had placed the forms from the patient in [place] Dr. Darnelle Bos box.   All of the paperwork enclosed looks like it's for the patient's husband. I am unclear as to exactly what this is for. Shawna Clamp, I am going to leave for Dr. Enid Derry to review next week as they may know the conversation surrounding this.   There is a section at the bottom of the form asking for Date of Service/ Place of Service/ Procedure Code/ Description of Service/ Type of Service/ Charges/ Days or Units/ Dx Code/ Summary of Charges.  Copy of this form placed on Michaela Hall's desk per her request.

## 2021-03-13 DIAGNOSIS — R55 Syncope and collapse: Secondary | ICD-10-CM

## 2021-03-15 NOTE — Telephone Encounter (Signed)
Patient returning call.

## 2021-03-15 NOTE — Telephone Encounter (Signed)
Attempted to call pt. Notified on vm (ok per DPR to LDM) that we have someone from our billing department confirming she will take care of completing the remaining portion of paperwork tomorrow morning.  Notified on vm that we will call her with further updates and she may call with any other questions.

## 2021-03-15 NOTE — Telephone Encounter (Signed)
Attempted to call pt to discuss paperwork. Need to determine if disability forms are for pt or for her husband.  No answer at this time.  LDM on pt' vm asking to call our office back to clarify.

## 2021-03-16 ENCOUNTER — Ambulatory Visit: Payer: No Typology Code available for payment source | Admitting: Internal Medicine

## 2021-03-22 ENCOUNTER — Ambulatory Visit: Payer: Medicare Other | Admitting: Family Medicine

## 2021-03-23 ENCOUNTER — Telehealth: Payer: Self-pay | Admitting: *Deleted

## 2021-03-23 ENCOUNTER — Telehealth (INDEPENDENT_AMBULATORY_CARE_PROVIDER_SITE_OTHER): Payer: Medicare Other | Admitting: Family Medicine

## 2021-03-23 ENCOUNTER — Other Ambulatory Visit (INDEPENDENT_AMBULATORY_CARE_PROVIDER_SITE_OTHER): Payer: Medicare Other

## 2021-03-23 DIAGNOSIS — R238 Other skin changes: Secondary | ICD-10-CM

## 2021-03-23 DIAGNOSIS — K3184 Gastroparesis: Secondary | ICD-10-CM

## 2021-03-23 DIAGNOSIS — E038 Other specified hypothyroidism: Secondary | ICD-10-CM

## 2021-03-23 DIAGNOSIS — R7989 Other specified abnormal findings of blood chemistry: Secondary | ICD-10-CM

## 2021-03-23 DIAGNOSIS — E039 Hypothyroidism, unspecified: Secondary | ICD-10-CM | POA: Insufficient documentation

## 2021-03-23 NOTE — Telephone Encounter (Signed)
-----   Message from Nelva Bush, MD sent at 03/23/2021  1:44 PM EDT ----- Please let Ms. Fuerstenberg know that her event monitor showed a few extra beats but no significant arrhythmia.  I recommend that she continue her current medications and follow-up as previously arranged.

## 2021-03-23 NOTE — Telephone Encounter (Signed)
Attempted to call pt. No answer. Lmtcb.  

## 2021-03-23 NOTE — Assessment & Plan Note (Signed)
May be related to thyroid issues.  Likely will improve with treatment of above.

## 2021-03-23 NOTE — Assessment & Plan Note (Signed)
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 Will recheck TSH. Also check free t4, free t3, and TPO antibodies. Will likely need to restart synthroid.  She would like to be referred to endocrinology.  We will do this once we have results back on her labs.

## 2021-03-23 NOTE — Assessment & Plan Note (Signed)
Overall stable.  She uses Zofran as needed.  Hopefully this will improve also with treatment of her thyroid issues.

## 2021-03-23 NOTE — Progress Notes (Signed)
   Michaela Hall is a 65 y.o. female who presents today for a virtual office visit.  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 Will recheck TSH. Also check free t4, free t3, and TPO antibodies. Will likely need to restart synthroid.  She would like to be referred to endocrinology.  We will do this once we have results back on her labs.  Gastroparesis Overall stable.  She uses Zofran as needed.  Hopefully this will improve also with treatment of her thyroid issues.  Abnormal skin color May be related to thyroid issues.  Likely will improve with treatment of above.     Subjective:  HPI:  Patient here to follow up on TSH. Her cardiologist checked labs a few weeks ago. She was found to have a TSH of 11.5. She has had longstanding issues with arthralgia, difficulty sleeping, fatigue, low blood pressure readings, and gastroparesis. Fatigue is severe. She has poor appetite and having issues with hair falling out. She was on Synthroid several years ago.  She has not been on this for several years.       Objective/Observations  Physical Exam: Gen: NAD, resting comfortably Pulm: Normal work of breathing Psych: Normal affect and thought content  Virtual Visit via Video   I connected with Michaela Hall on 03/23/21 at 11:20 AM EDT by a video enabled telemedicine application and verified that I am speaking with the correct person using two identifiers. The limitations of evaluation and management by telemedicine and the availability of in person appointments were discussed. The patient expressed understanding and agreed to proceed.   Patient location: Home Provider location: [place] participating in the virtual visit: Myself and Patient     [name]. [name], MD 03/23/2021 11:48 AM

## 2021-03-24 ENCOUNTER — Other Ambulatory Visit: Payer: Self-pay | Admitting: *Deleted

## 2021-03-24 DIAGNOSIS — E063 Autoimmune thyroiditis: Secondary | ICD-10-CM

## 2021-03-24 LAB — TSH: TSH: 4.01 u[IU]/mL (ref 0.35–5.50)

## 2021-03-24 LAB — THYROID PEROXIDASE ANTIBODIES (TPO) (REFL): Thyroperoxidase Ab SerPl-aCnc: 582 IU/mL — ABNORMAL HIGH (ref <9)

## 2021-03-24 LAB — T4, FREE: Free T4: 0.58 ng/dL — ABNORMAL LOW (ref 0.60–1.60)

## 2021-03-24 LAB — T3, FREE: T3, Free: 2.8 pg/mL (ref 2.3–4.2)

## 2021-03-24 NOTE — Progress Notes (Signed)
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 Recommend starting synthroid 22mcg daily and referring her to endocrinology.  [name]. [name], MD 03/24/2021 12:58 PM

## 2021-03-29 ENCOUNTER — Other Ambulatory Visit: Payer: Self-pay

## 2021-03-29 ENCOUNTER — Telehealth: Payer: Self-pay

## 2021-03-29 MED ORDER — LEVOTHYROXINE SODIUM 25 MCG PO TABS: 25.0000 ug | ORAL_TABLET | Freq: Every day | ORAL | 0 refills | Status: DC

## 2021-03-29 NOTE — Telephone Encounter (Signed)
Pt called regarding lab results. She stated that she knows that after labs, she will need a prescription. Eithel would like a call back. Please Advise.

## 2021-03-29 NOTE — Telephone Encounter (Signed)
LM on pt phone tcb.

## 2021-03-30 ENCOUNTER — Telehealth: Payer: Self-pay

## 2021-03-30 NOTE — Telephone Encounter (Signed)
Spoke with patient Rx sent to the pharmacy Referral updated to another Endocrinology

## 2021-03-30 NOTE — Telephone Encounter (Signed)
Pt called wanting to thank Dr [name] for doing the antithyroid antibodies test. She stated that she was diagnosed with Hashimoto's. She said that she thanks Dr [name] from the bottom of her heart.

## 2021-03-30 NOTE — Telephone Encounter (Signed)
The patient has been notified of the result and verbalized understanding.  All questions (if any) were answered.     

## 2021-03-31 ENCOUNTER — Other Ambulatory Visit: Payer: Self-pay

## 2021-03-31 DIAGNOSIS — R7989 Other specified abnormal findings of blood chemistry: Secondary | ICD-10-CM

## 2021-03-31 DIAGNOSIS — E063 Autoimmune thyroiditis: Secondary | ICD-10-CM

## 2021-04-08 ENCOUNTER — Ambulatory Visit (INDEPENDENT_AMBULATORY_CARE_PROVIDER_SITE_OTHER): Payer: Medicare Other

## 2021-04-08 ENCOUNTER — Telehealth: Payer: Self-pay

## 2021-04-08 ENCOUNTER — Other Ambulatory Visit: Payer: Self-pay

## 2021-04-08 DIAGNOSIS — R55 Syncope and collapse: Secondary | ICD-10-CM

## 2021-04-08 LAB — ECHOCARDIOGRAM COMPLETE
AR max vel: 2.28 cm2
AV Area VTI: 2.45 cm2
AV Area mean vel: 2.17 cm2
AV Mean grad: 3 mmHg
AV Peak grad: 4.8 mmHg
Ao pk vel: 1.1 m/s
Area-P 1/2: 3.02 cm2
Calc EF: 51.9 %
S' Lateral: 2.1 cm
Single Plane A2C EF: 53.6 %
Single Plane A4C EF: 53.3 %

## 2021-04-08 NOTE — Telephone Encounter (Signed)
Pt called regarding a referral to Endo. PT does not want to be sent [facility]. Emnet stated that she wants to be referred to Dr Lavone Orn in [place]. Can a new referral be sent? Please Advise.

## 2021-04-09 NOTE — Progress Notes (Signed)
Cardiology Office Note    Date:  04/12/2021   ID:  Michaela Hall, DOB 1955/10/04, MRN [id number]  PCP:  Vivi Barrack, MD  Cardiologist:  None  Electrophysiologist:  None   Chief Complaint: Follow-up  History of Present Illness:   Michaela Hall is a 65 y.o. female with history of syncope, Raynaud's phenomena, gastroparesis, osteoarthritis, chronic hip and back pain, anxiety, and depression who presents for follow-up of syncope.  She was previously evaluated by Dr. Claiborne Billings in 2017 for shortness of breath after having been essentially bedridden for 7 months, along with occasional chest discomfort.  Lexiscan MPI at that time was low risk without evidence of ischemia or scar.  Echo demonstrated a preserved LV systolic function with grade 2 diastolic dysfunction.  More recently, she establish care with Dr. Saunders Revel in 02/2021 for further evaluation of approximately 6 episodes of passing out over the prior 6 months with multiple episodes of lightheadedness and presyncope.  In most cases, she reported these episodes occur when she wakes up at night and reported she is still lying down when she would begin to feel lightheaded.  There were no obvious precipitants that she could identify.  Physical exam and EKG, including QT interval, were normal.  Outpatient cardiac monitoring demonstrated a predominant rhythm of sinus with an average rate of 80 bpm (range 50 to 141 bpm), rare PACs and PVCs, single atrial run lasting 4 beats was noted with a maximal rate of 126 bpm, no sustained arrhythmias or prolonged pauses were observed.  A single patient triggered event corresponded to sinus rhythm.  Echo on 04/08/2021, demonstrated an EF of 60 to 65%, no regional wall motion abnormalities grade 1 diastolic dysfunction, normal global longitudinal strain, normal RV systolic function and ventricular cavity size, no significant valvular abnormalities, and an estimated right atrial pressure of 3 mmHg.  Carotid artery  ultrasound on 10/28 showed bilateral 1 to 39% ICA stenoses with antegrade flow of the bilateral vertebral arteries and normal flow hemodynamics of the bilateral subclavian arteries.  She comes in today continuing to not feel well.  She notes generalized malaise and fatigue with cold intolerance.  She has been diagnosed with autoimmune thyroiditis with recommendation to initiate levothyroxine.  She has been referred to endocrinology.  Since she was last seen, she has had 1 further syncopal episode, again occurring at nighttime when laying down in her bed that was associated with dizziness prior to this episode.  She reports her husband checked her BP during the episode with readings in the 60s over 30s.  Upon getting up and ambulating up and down the hallway her symptoms and BP resolved/improved.  With these episodes of syncope she reports chest pressure and shortness of breath.  Episodes of syncope have also been associated with emesis in the past.  Currently without chest pressure.   Labs independently reviewed: 03/2021 - TSH normal 02/2021 - magnesium 2.3, BUN 20, serum creatinine 0.91, potassium 4.5, albumin 4.6, AST/ALT normal, Hgb 15.9, PLT 220  Past Medical History:  Diagnosis Date   Anxiety    Cervical spondylosis without myelopathy 10/21/2015   Cervical spondylosis without myelopathy 10/21/2015   Chronic pain    back and right hip   Complication of anesthesia    Depression    DJD (degenerative joint disease)    Headache(784.0)    PONV (postoperative nausea and vomiting)    Raynaud's disease    Weakness of left arm 10/21/2015    Past Surgical History:  Procedure Laterality Date   BACK SURGERY     ESOPHAGOGASTRODUODENOSCOPY  09/28/2011   Procedure: ESOPHAGOGASTRODUODENOSCOPY (EGD);  Surgeon: Winfield Cunas., MD;  Location: [facility];  Service: Endoscopy;  Laterality: N/A;   HIP SURGERY     KNEE SURGERY     SPINE SURGERY     TONSILLECTOMY      Current  Medications: Current Meds  Medication Sig   clonazePAM (KLONOPIN) 1 MG tablet Take 1 tablet (1 mg total) by mouth 2 (two) times daily. (Patient taking differently: Take 1 mg by mouth 2 (two) times daily as needed for anxiety.)   estradiol (ESTRACE) 1 MG tablet Take 1 tablet by mouth daily.   gabapentin (NEURONTIN) 300 MG capsule Take 300 mg by mouth 3 (three) times daily.   levothyroxine (SYNTHROID) 25 MCG tablet Take 1 tablet (25 mcg total) by mouth daily.   metoprolol tartrate (LOPRESSOR) 100 MG tablet Take 1 tablet (100 mg total) by mouth once for 1 dose. Take 2 hours prior to test.   mirtazapine (REMERON) 15 MG tablet Take 15 mg by mouth at bedtime.   ondansetron (ZOFRAN-ODT) 8 MG disintegrating tablet Take 1 tablet by mouth 3 (three) times daily. Take 1 tab by mouth tid   Oxycodone HCl 10 MG TABS Take 10 mg by mouth every 4 (four) hours as needed (pain).   progesterone (PROMETRIUM) 200 MG capsule Take 200 mg by mouth daily.    Allergies:   Patient has no known allergies.   Social History   Socioeconomic History   Marital status: Married    Spouse name: Not on file   Number of children: 3   Years of education: Not on file   Highest education level: Not on file  Occupational History   Occupation: Medicare ins. Scientist, clinical (histocompatibility and immunogenetics): [employer]  Tobacco Use   Smoking status: Former    Packs/day: 0.50    Years: 20.00    Pack years: 10.00    Types: Cigarettes    Quit date: 06/12/2014    Years since quitting: 6.8   Smokeless tobacco: Never  Vaping Use   Vaping Use: Some days   Devices: does not include nicotine  Substance and Sexual Activity   Alcohol use: No    Alcohol/week: 0.0 standard drinks   Drug use: No   Sexual activity: Not on file  Other Topics Concern   Not on file  Social History Narrative   Lives at home w/ her husband   Right-handed   Very little caffeine use, drinks 7up   Last colon screening: 09/2011   Social Determinants of Health   Financial  Resource Strain: Not on file  Food Insecurity: Not on file  Transportation Needs: Not on file  Physical Activity: Not on file  Stress: Not on file  Social Connections: Not on file     Family History:  The patient's family history includes Anesthesia problems in her mother; Brain cancer in her father; Breast cancer in her maternal aunt; Cancer in her mother; Hyperlipidemia in her father and mother; Hypertension in her father and mother; Ovarian cancer in her maternal grandmother; Subarachnoid hemorrhage in her mother.  ROS:   Review of Systems  Constitutional:  Positive for malaise/fatigue. Negative for chills, diaphoresis, fever and weight loss.  HENT:  Negative for congestion.   Eyes:  Negative for discharge and redness.  Respiratory:  Positive for shortness of breath. Negative for cough, sputum production and wheezing.   Cardiovascular:  Positive for  chest pain. Negative for palpitations, orthopnea, claudication, leg swelling and PND.  Gastrointestinal:  Positive for vomiting. Negative for abdominal pain, heartburn and nausea.  Musculoskeletal:  Negative for falls and myalgias.  Skin:  Negative for rash.  Neurological:  Positive for dizziness, loss of consciousness and weakness. Negative for tingling, tremors, sensory change, speech change and focal weakness.  Endo/Heme/Allergies:  Does not bruise/bleed easily.       Cold intolerance  Psychiatric/Behavioral:  Negative for substance abuse. The patient is not nervous/anxious.   All other systems reviewed and are negative.   EKGs/Labs/Other Studies Reviewed:    Studies reviewed were summarized above. The additional studies were reviewed today:  Carotid artery ultrasound 04/08/2021: Summary:  Right Carotid: Velocities in the right ICA are consistent with a 1-39%  stenosis. Non-hemodynamically significant plaque <50% noted in the CCA. The ECA appears <50% stenosed.   Left Carotid: Velocities in the left ICA are consistent with a  1-39%  stenosis. Non-hemodynamically significant plaque <50% noted in the CCA. The ECA appears <50% stenosed.   Vertebrals:  Bilateral vertebral arteries demonstrate antegrade flow.  Subclavians: Normal flow hemodynamics were seen in bilateral subclavian arteries.  __________  2D echo 04/08/2021:  1. Left ventricular ejection fraction, by estimation, is 60 to 65%. The  left ventricle has normal function. The left ventricle has no regional  wall motion abnormalities. Left ventricular diastolic parameters are  consistent with Grade I diastolic  dysfunction (impaired relaxation). The average left ventricular global  longitudinal strain is -20.0 %. The global longitudinal strain is normal.   2. Right ventricular systolic function is normal. The right ventricular  size is normal. Tricuspid regurgitation signal is inadequate for assessing  PA pressure.   3. The mitral valve is normal in structure. No evidence of mitral valve  regurgitation. No evidence of mitral stenosis.   4. The aortic valve was not well visualized. Aortic valve regurgitation  is not visualized. No aortic stenosis is present.   5. The inferior vena cava is normal in size with greater than 50%  respiratory variability, suggesting right atrial pressure of 3 mmHg. __________  Elwyn Reach patch 02/2021: The patient was monitored for 10 days, 18 hours. The predominant rhythm was sinus with an average rate of 80 bpm (range 50-141 bpm). There were rare PACs and PVCs. A single atrial run lasting 4 beats occurred with a maximum rate of 126 bpm. No sustained arrhythmia or prolonged pause was observed. Single patient triggered event corresponds to normal sinus rhythm.   Predominantly sinus rhythm with rare PACs and PVCs as well as a single brief episode of PSVT.  No significant arrhythmia observed. __________  2D echo 08/16/2015: - Left ventricle: The cavity size was normal. Systolic function was    normal. The estimated ejection fraction  was in the range of 60%    to 65%. Wall motion was normal; there were no regional wall    motion abnormalities. Features are consistent with a pseudonormal    left ventricular filling pattern, with concomitant abnormal    relaxation and increased filling pressure (grade 2 diastolic    dysfunction).  - Aortic valve: There was no regurgitation.  - Mitral valve: Transvalvular velocity was within the normal range.    There was no evidence for stenosis. There was no regurgitation.  - Right ventricle: The cavity size was normal. Wall thickness was    normal. Systolic function was normal.  - Tricuspid valve: There was no regurgitation.  - Inferior vena cava: The  vessel was normal in size. The    respirophasic diameter changes were in the normal range (>= 50%),    consistent with normal central venous pressure. __________  Nuclear stress test 08/13/2015: The left ventricular ejection fraction is normal (55-65%). Nuclear stress EF: 64%. There was no ST segment deviation noted during stress. The study is normal. No evidence of ischemia This is a low risk study.    EKG:  EKG is not ordered today given recent reassuring outpatient cardiac monitoring.    Recent Labs: 03/02/2021: ALT 6; BUN 20; Creatinine, Ser 0.91; Hemoglobin 15.9; Magnesium 2.3; Platelets 220; Potassium 4.5; Sodium 141 03/23/2021: TSH 4.01  Recent Lipid Panel No results found for: CHOL, TRIG, HDL, CHOLHDL, VLDL, LDLCALC, LDLDIRECT  PHYSICAL EXAM:    VS:  BP (!) 148/84 (BP Location: Left Arm, Patient Position: Sitting, Cuff Size: Normal)   Pulse 72   Ht 5\' 2"  (1.575 m)   Wt 122 lb (55.3 kg)   SpO2 97%   BMI 22.31 kg/m   BMI: Body mass index is 22.31 kg/m.  Physical Exam Vitals reviewed.  Constitutional:      Appearance: She is well-developed.  HENT:     Head: Normocephalic and atraumatic.  Eyes:     General:        Right eye: No discharge.        Left eye: No discharge.  Neck:     Vascular: No JVD.   Cardiovascular:     Rate and Rhythm: Normal rate and regular rhythm.     Pulses:          Posterior tibial pulses are 2+ on the right side and 2+ on the left side.     Heart sounds: Normal heart sounds, S1 normal and S2 normal. Heart sounds not distant. No midsystolic click and no opening snap. No murmur heard.   No friction rub.  Pulmonary:     Effort: Pulmonary effort is normal. No respiratory distress.     Breath sounds: Normal breath sounds. No decreased breath sounds, wheezing or rales.  Chest:     Chest wall: No tenderness.  Abdominal:     General: There is no distension.     Palpations: Abdomen is soft.     Tenderness: There is no abdominal tenderness.  Musculoskeletal:     Cervical back: Normal range of motion.     Right lower leg: No edema.     Left lower leg: No edema.  Skin:    General: Skin is warm and dry.     Nails: There is no clubbing.  Neurological:     Mental Status: She is alert and oriented to person, place, and time.  Psychiatric:        Speech: Speech normal.        Behavior: Behavior normal.        Thought Content: Thought content normal.        Judgment: Judgment normal.    Wt Readings from Last 3 Encounters:  04/12/21 122 lb (55.3 kg)  03/02/21 119 lb (54 kg)  08/27/20 122 lb (55.3 kg)     ASSESSMENT & PLAN:   Syncope: Since she was last seen, she has had 1 further syncopal episode, again occurring at night when laying in bed.  Work-up including echo, outpatient cardiac monitoring, and carotid artery ultrasound has been unrevealing.  Schedule coronary CTA as outlined below given history of syncope with associated chest pressure.  Continue to refrain from driving for 6 months,  dated back to most recent syncopal episode, or until an identifiable cause has been found and adequately treated.  Chest pain with moderate risk for cardiac etiology/precordial pain: Currently chest pain-free.  She does note chest pressure that has been associated with her  syncopal episodes.  Schedule coronary CTA to evaluate for high risk ischemia.  Carotid artery disease: Mild 1-39% bilateral ICA stenosis.  Check CMP and lipid panel for further risk stratification.  Of note, she is fasting today.  Elevated blood pressure without diagnosis of hypertension: Blood pressure is mildly elevated in the office today.  Not currently on antihypertensive medication.  Syncope, we will defer addition of antihypertensive therapy.  Continue to monitor.  Felt to be in the setting of whitecoat hypertension.  Historically, blood pressure has run low outside of medical offices.  Disposition: F/u with Dr. Saunders Revel or an APP in 2 months.   Medication Adjustments/Labs and Tests Ordered: Current medicines are reviewed at length with the patient today.  Concerns regarding medicines are outlined above. Medication changes, Labs and Tests ordered today are summarized above and listed in the Patient Instructions accessible in Encounters.   Signed, Christell Faith, PA-C 04/12/2021 2:55 PM     [address] [place], [place] [address] [phone]

## 2021-04-12 ENCOUNTER — Other Ambulatory Visit: Payer: Self-pay

## 2021-04-12 ENCOUNTER — Ambulatory Visit (INDEPENDENT_AMBULATORY_CARE_PROVIDER_SITE_OTHER): Payer: Medicare Other | Admitting: Physician Assistant

## 2021-04-12 ENCOUNTER — Encounter: Payer: Self-pay | Admitting: Physician Assistant

## 2021-04-12 VITALS — BP 148/84 | HR 72 | Ht 62.0 in | Wt 122.0 lb

## 2021-04-12 DIAGNOSIS — R55 Syncope and collapse: Secondary | ICD-10-CM

## 2021-04-12 DIAGNOSIS — R030 Elevated blood-pressure reading, without diagnosis of hypertension: Secondary | ICD-10-CM

## 2021-04-12 DIAGNOSIS — I6523 Occlusion and stenosis of bilateral carotid arteries: Secondary | ICD-10-CM | POA: Diagnosis not present

## 2021-04-12 DIAGNOSIS — R072 Precordial pain: Secondary | ICD-10-CM

## 2021-04-12 MED ORDER — METOPROLOL TARTRATE 100 MG PO TABS: 100.0000 mg | ORAL_TABLET | Freq: Once | ORAL | 0 refills | Status: DC

## 2021-04-12 NOTE — Telephone Encounter (Signed)
Referral placed.

## 2021-04-12 NOTE — Telephone Encounter (Signed)
Ok with me. Please place any necessary orders. 

## 2021-04-12 NOTE — Patient Instructions (Signed)
Medication Instructions:  No changes at this time.  *If you need a refill on your cardiac medications before your next appointment, please call your pharmacy*   Lab Work: Lipid, CMET today  If you have labs (blood work) drawn today and your tests are completely normal, you will receive your results only by: [place] (if you have MyChart) OR A paper copy in the mail If you have any lab test that is abnormal or we need to change your treatment, we will call you to review the results.   Testing/Procedures:   Your cardiac CT will be scheduled at:   Monday 05/02/21 at 3:30 pm   [facility] [place], [place] [address] [phone]   Please arrive 15 mins (3:15 pm) early for check-in and test prep.  Please follow these instructions carefully (unless otherwise directed):  Hold all erectile dysfunction medications at least 3 days (72 hrs) prior to test.  On the Night Before the Test: Be sure to Drink plenty of water. Do not consume any caffeinated/decaffeinated beverages or chocolate 12 hours prior to your test. Do not take any antihistamines 12 hours prior to your test.   On the Day of the Test: Drink plenty of water until 1 hour prior to the test. Do not eat any food 4 hours prior to the test. You may take your regular medications prior to the test.  Take metoprolol 100 mg (Lopressor) two hours prior to test. FEMALES- please wear underwire-free bra if available, avoid dresses & tight clothing       After the Test: Drink plenty of water. After receiving IV contrast, you may experience a mild flushed feeling. This is normal. On occasion, you may experience a mild rash up to 24 hours after the test. This is not dangerous. If this occurs, you can take Benadryl 25 mg and increase your fluid intake. If you experience trouble breathing, this can be serious. If it is severe call 911 IMMEDIATELY. If it is mild,  please call our office. If you take any of these medications: Glipizide/Metformin, Avandament, Glucavance, please do not take 48 hours after completing test unless otherwise instructed.  Please allow 2-4 weeks for scheduling of routine cardiac CTs. Some insurance companies require a pre-authorization which may delay scheduling of this test.   For non-scheduling related questions, please contact the cardiac imaging nurse navigator should you have any questions/concerns: Marchia Bond, Cardiac Imaging Nurse Navigator Gordy Clement, Cardiac Imaging Nurse Navigator [facility] Direct Office Dial: [phone]   For scheduling needs, including cancellations and rescheduling, please call [place], [phone].    Follow-Up: At [facility], you and your health needs are our priority.  As part of our continuing mission to provide you with exceptional heart care, we have created designated Provider Care Teams.  These Care Teams include your primary Cardiologist (physician) and Advanced Practice Providers (APPs -  Physician Assistants and Nurse Practitioners) who all work together to provide you with the care you need, when you need it.   Your next appointment:   2 month(s)  The format for your next appointment:   In Person  Provider:   Christell Faith, PA-C

## 2021-04-12 NOTE — Telephone Encounter (Signed)
Please advise 

## 2021-04-13 LAB — COMPREHENSIVE METABOLIC PANEL
ALT: 6 IU/L (ref 0–32)
AST: 15 IU/L (ref 0–40)
Albumin/Globulin Ratio: 2.6 — ABNORMAL HIGH (ref 1.2–2.2)
Albumin: 4.7 g/dL (ref 3.8–4.8)
Alkaline Phosphatase: 89 IU/L (ref 44–121)
BUN/Creatinine Ratio: 20 (ref 12–28)
BUN: 16 mg/dL (ref 8–27)
Bilirubin Total: 0.2 mg/dL (ref 0.0–1.2)
CO2: 26 mmol/L (ref 20–29)
Calcium: 9.5 mg/dL (ref 8.7–10.3)
Chloride: 105 mmol/L (ref 96–106)
Creatinine, Ser: 0.8 mg/dL (ref 0.57–1.00)
Globulin, Total: 1.8 g/dL (ref 1.5–4.5)
Glucose: 103 mg/dL — ABNORMAL HIGH (ref 70–99)
Potassium: 4.7 mmol/L (ref 3.5–5.2)
Sodium: 143 mmol/L (ref 134–144)
Total Protein: 6.5 g/dL (ref 6.0–8.5)
eGFR: 82 mL/min/{1.73_m2} (ref >59)

## 2021-04-13 LAB — LIPID PANEL
Chol/HDL Ratio: 3.5 ratio (ref 0.0–4.4)
Cholesterol, Total: 228 mg/dL — ABNORMAL HIGH (ref 100–199)
HDL: 65 mg/dL (ref >39)
LDL Chol Calc (NIH): 145 mg/dL — ABNORMAL HIGH (ref 0–99)
Triglycerides: 105 mg/dL (ref 0–149)
VLDL Cholesterol Cal: 18 mg/dL (ref 5–40)

## 2021-04-29 ENCOUNTER — Telehealth (HOSPITAL_COMMUNITY): Payer: Self-pay | Admitting: *Deleted

## 2021-04-29 NOTE — Telephone Encounter (Signed)
Reaching out to patient to offer assistance regarding upcoming cardiac imaging study; pt verbalizes understanding of appt date/time, parking situation and where to check in, pre-test NPO status and medications ordered, and verified current allergies; name and call back number provided for further questions should they arise  Gordy Clement RN Navigator Cardiac Imaging [facility] [phone] office [phone] cell  Patient to take 100mg  metoprolol tartrate two hours prior to cardiac CT scan.  She is aware to arrive by 3:15pm for her 3:30pm scan.

## 2021-05-02 ENCOUNTER — Other Ambulatory Visit: Payer: Self-pay

## 2021-05-02 ENCOUNTER — Ambulatory Visit
Admission: RE | Admit: 2021-05-02 | Discharge: 2021-05-02 | Disposition: A | Discharge status: Home / Self Care | Payer: Medicare Other | Source: Ambulatory Visit | Attending: Physician Assistant | Admitting: Physician Assistant

## 2021-05-02 DIAGNOSIS — R072 Precordial pain: Secondary | ICD-10-CM | POA: Diagnosis present

## 2021-05-02 HISTORY — DX: Raynaud's syndrome without gangrene: I73.00

## 2021-05-02 HISTORY — DX: Autoimmune thyroiditis: E06.3

## 2021-05-02 MED ORDER — METOPROLOL TARTRATE 5 MG/5ML IV SOLN: 10.0000 mg | Freq: Once | INTRAVENOUS | Status: DC

## 2021-05-02 MED ORDER — IOHEXOL 350 MG/ML SOLN
75.0000 mL | Freq: Once | INTRAVENOUS | Status: AC | PRN
  Administered 2021-05-02: 75 mL via INTRAVENOUS

## 2021-05-02 MED ORDER — NITROGLYCERIN 0.4 MG SL SUBL
0.8000 mg | SUBLINGUAL_TABLET | Freq: Once | SUBLINGUAL | Status: AC
  Administered 2021-05-02: 0.8 mg via SUBLINGUAL

## 2021-05-02 NOTE — Progress Notes (Signed)
Patient tolerated procedure well. Ambulate w/o difficulty. Denies light headedness or being dizzy. Sitting in chair drinking water provided. Encouraged to drink extra water today and reasoning explained. Verbalized understanding. All questions answered. ABC intact. No further needs. Discharge from procedure area w/o issues.   °

## 2021-05-03 ENCOUNTER — Telehealth: Payer: Self-pay | Admitting: *Deleted

## 2021-05-03 DIAGNOSIS — I6523 Occlusion and stenosis of bilateral carotid arteries: Secondary | ICD-10-CM

## 2021-05-03 DIAGNOSIS — R55 Syncope and collapse: Secondary | ICD-10-CM

## 2021-05-03 MED ORDER — ASPIRIN EC 81 MG PO TBEC: 81.0000 mg | DELAYED_RELEASE_TABLET | Freq: Every day | ORAL | 3 refills | Status: AC

## 2021-05-03 MED ORDER — ATORVASTATIN CALCIUM 40 MG PO TABS: 40.0000 mg | ORAL_TABLET | Freq: Every day | ORAL | 3 refills | Status: DC

## 2021-05-03 NOTE — Telephone Encounter (Signed)
Reviewed results and recommendations with patient. She was agreeable with plan and orders entered. She verbalized understanding of our conversation with no further questions at this time.

## 2021-05-03 NOTE — Telephone Encounter (Signed)
-----   Message from Rise Mu, [place] sent at 05/03/2021  8:40 AM EST ----- Coronary CTA demonstrated aortic atherosclerosis with a calcium score of 31.9 placing her in the 69th percentile.  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  Recommendations:  -Aggressive risk factor modification -LDL earlier this month of 145 with goal now being less than 70 -If she is agreeable, start aspirin 81 mg daily along with atorvastatin 40 mg daily -If she starts the above statin, recommend follow-up fasting lipid panel and liver function in 2 months

## 2021-05-04 ENCOUNTER — Encounter (HOSPITAL_COMMUNITY): Admission: RE | Admit: 2021-05-04 | Payer: Medicare Other | Source: Ambulatory Visit

## 2021-05-04 NOTE — Telephone Encounter (Signed)
Recall placed and printed will fu when schedule available.

## 2021-06-07 ENCOUNTER — Other Ambulatory Visit: Payer: Self-pay

## 2021-06-07 NOTE — Telephone Encounter (Signed)
..   Encourage patient to contact the pharmacy for refills or they can request refills through [place]: 03/23/21   NEXT APPOINTMENT DATE:  MEDICATION: klonopin  Is the patient out of medication?   PHARMACY:  [facility]   Let patient know to contact pharmacy at the end of the day to make sure medication is ready.  Please notify patient to allow 48-72 hours to process  CLINICAL FILLS OUT ALL BELOW:   LAST REFILL:  QTY:  REFILL DATE:    OTHER COMMENTS:    Okay for refill?  Please advise

## 2021-06-08 MED ORDER — CLONAZEPAM 1 MG PO TABS: 1.0000 mg | ORAL_TABLET | Freq: Two times a day (BID) | ORAL | 2 refills | Status: DC

## 2021-06-09 ENCOUNTER — Telehealth: Payer: Self-pay | Admitting: Internal Medicine

## 2021-06-09 NOTE — Progress Notes (Deleted)
{Choose 1 Note Type (Video or Telephone):[phone]}    Date:  06/10/2021   ID:  AJAH VANHOOSE, DOB [birth year], MRN [id number] The patient was identified using 2 identifiers.  {Patient [phone]::"Home"} {Provider [phone]::"Home Office"}   PCP:  Michaela Barrack, MD   [facility] Providers Cardiologist:  Michaela Bush, MD {  Evaluation Performed:  Follow-Up Visit  Chief Complaint:  Follow up  History of Present Illness:    Michaela Hall is a 65 y.o. female with syncope, Raynaud's phenomena, gastroparesis, osteoarthritis, chronic hip and back pain, anxiety, and depression who presents for follow-up of coronary CTA.   She was previously evaluated by Dr. Claiborne Hall in 2017 for shortness of breath after having been essentially bedridden for 7 months, along with occasional chest discomfort.  Lexiscan MPI at that time was low risk without evidence of ischemia or scar.  Echo demonstrated a preserved LV systolic function with grade 2 diastolic dysfunction.  More recently, she establish care with Dr. Saunders Revel in 02/2021 for further evaluation of approximately 6 episodes of passing out over the prior 6 months with multiple episodes of lightheadedness and presyncope.  In most cases, she reported these episodes occur when she wakes up at night and reported she is still lying down when she would begin to feel lightheaded.  There were no obvious precipitants that she could identify.  Physical exam and EKG, including QT interval, were normal.  Outpatient cardiac monitoring demonstrated a predominant rhythm of sinus with an average rate of 80 bpm (range 50 to 141 bpm), rare PACs and PVCs, single atrial run lasting 4 beats was noted with a maximal rate of 126 bpm, no sustained arrhythmias or prolonged pauses were observed.  A single patient triggered event corresponded to sinus rhythm.  Echo on 04/08/2021, demonstrated an EF of 60 to 65%, no regional wall motion abnormalities grade 1  diastolic dysfunction, normal global longitudinal strain, normal RV systolic function and ventricular cavity size, no significant valvular abnormalities, and an estimated right atrial pressure of 3 mmHg.  Carotid artery ultrasound on 10/28 showed bilateral 1 to 39% ICA stenoses with antegrade flow of the bilateral vertebral arteries and normal flow hemodynamics of the bilateral subclavian arteries.  She was last seen in the office on 04/12/2021 and reported she continued to not feel well noting generalized malaise, fatigue, and cold intolerance.  She reported having been diagnosed with autoimmune thyroiditis with recommendation to initiate levothyroxine and was referred to endocrinology by outside office.  She also reported 1 further syncopal episode since she had last been seen that occurred at nighttime when laying down in her bed and was associated with dizziness.  She reported her blood pressure at that time it was in the 60s over 30s and noted upon getting up and ambulating up and down the hallway her symptoms/BP improved.  She also reported chest pressure and shortness of breath associated with this episode.  She underwent coronary CTA on 05/03/2021 which demonstrated a calcium score of 31.9 which was the 69th percentile.  There was mild nonobstructive CAD noted involving the proximal LAD causing 25% stenosis.  Medical therapy and risk factor modification was recommended.  ***   Labs independently reviewed: 04/2021 - BUN 16, serum creatinine 0.8, potassium 4.7, albumin 4.7, AST/ALT normal, TC 228, TG 105, HDL 65, LDL 145 03/2021 - TSH normal 02/2021 - magnesium 2.3, Hgb 15.9, PLT 220  The patient {does/does not:200015} have symptoms concerning for COVID-19 infection (fever, chills, cough, or new shortness of breath).  Past Medical History:  Diagnosis Date   Anxiety    Cervical spondylosis without myelopathy 10/21/2015   Cervical spondylosis without myelopathy 10/21/2015   Chronic pain     back and right hip   Complication of anesthesia    Depression    DJD (degenerative joint disease)    Hashimoto's disease    Headache(784.0)    PONV (postoperative nausea and vomiting)    Raynaud disease    Raynaud's disease    Weakness of left arm 10/21/2015   Past Surgical History:  Procedure Laterality Date   BACK SURGERY     ESOPHAGOGASTRODUODENOSCOPY  09/28/2011   Procedure: ESOPHAGOGASTRODUODENOSCOPY (EGD);  Surgeon: Michaela Hall., MD;  Location: [facility];  Service: Endoscopy;  Laterality: N/A;   HIP SURGERY     KNEE SURGERY     SPINE SURGERY     TONSILLECTOMY       No outpatient medications have been marked as taking for the 06/14/21 encounter (Appointment) with Rise Mu, PA-C.     Allergies:   Gentamycin [gentamicin]   Social History   Tobacco Use   Smoking status: Former    Packs/day: 0.50    Years: 20.00    Pack years: 10.00    Types: Cigarettes    Quit date: 06/12/2014    Years since quitting: 7.0   Smokeless tobacco: Never  Vaping Use   Vaping Use: Some days   Devices: does not include nicotine  Substance Use Topics   Alcohol use: No    Alcohol/week: 0.0 standard drinks   Drug use: No     Family Hx: The patient's family history includes Anesthesia problems in her mother; Brain cancer in her father; Breast cancer in her maternal aunt; Cancer in her mother; Hyperlipidemia in her father and mother; Hypertension in her father and mother; Ovarian cancer in her maternal grandmother; Subarachnoid hemorrhage in her mother.  ROS:   Please see the history of present illness.     All other systems reviewed and are negative.   Prior CV studies:   The following studies were reviewed today:  Coronary CTA 05/03/2021: FINDINGS: Aorta:  Normal size.  No calcifications.  No dissection.   Aortic Valve:  Trileaflet.  No calcifications.   Coronary Arteries:  Normal coronary origin.  Right dominance.   RCA is a dominant artery that gives rise to PDA  and PLA. There is no plaque.   Left main is a large artery that gives rise to LAD and LCX arteries. There is no LM disease.   LAD has calcified plaque proximally causing mild (25%) stenosis.   LCX is a non-dominant artery that gives rise to two obtuse marginal branches. There is no plaque.   Other findings:   Normal pulmonary vein drainage into the left atrium.   Normal left atrial appendage without a thrombus.   Normal size of the pulmonary artery.   IMPRESSION: 1. Coronary calcium score of 31.9. This was 69th percentile for age and sex matched control.   2. Normal coronary origin with right dominance.   3. Calcified plaque causing mild proximal LAD stenosis.   4. CAD-RADS 2. Mild non-obstructive CAD (25-49%). Consider non-atherosclerotic causes of chest pain. Consider preventive therapy and risk factor modification. __________  Carotid artery ultrasound 04/08/2021: Summary:  Right Carotid: Velocities in the right ICA are consistent with a 1-39%  stenosis. Non-hemodynamically significant plaque <50% noted in the CCA. The ECA appears <50% stenosed.   Left Carotid: Velocities in the left  ICA are consistent with a 1-39%  stenosis. Non-hemodynamically significant plaque <50% noted in the CCA. The ECA appears <50% stenosed.   Vertebrals:  Bilateral vertebral arteries demonstrate antegrade flow.  Subclavians: Normal flow hemodynamics were seen in bilateral subclavian arteries.  __________   2D echo 04/08/2021:  1. Left ventricular ejection fraction, by estimation, is 60 to 65%. The  left ventricle has normal function. The left ventricle has no regional  wall motion abnormalities. Left ventricular diastolic parameters are  consistent with Grade I diastolic  dysfunction (impaired relaxation). The average left ventricular global  longitudinal strain is -20.0 %. The global longitudinal strain is normal.   2. Right ventricular systolic function is normal. The right  ventricular  size is normal. Tricuspid regurgitation signal is inadequate for assessing  PA pressure.   3. The mitral valve is normal in structure. No evidence of mitral valve  regurgitation. No evidence of mitral stenosis.   4. The aortic valve was not well visualized. Aortic valve regurgitation  is not visualized. No aortic stenosis is present.   5. The inferior vena cava is normal in size with greater than 50%  respiratory variability, suggesting right atrial pressure of 3 mmHg. __________   Elwyn Reach patch 02/2021: The patient was monitored for 10 days, 18 hours. The predominant rhythm was sinus with an average rate of 80 bpm (range 50-141 bpm). There were rare PACs and PVCs. A single atrial run lasting 4 beats occurred with a maximum rate of 126 bpm. No sustained arrhythmia or prolonged pause was observed. Single patient triggered event corresponds to normal sinus rhythm.   Predominantly sinus rhythm with rare PACs and PVCs as well as a single brief episode of PSVT.  No significant arrhythmia observed. __________   2D echo 08/16/2015: - Left ventricle: The cavity size was normal. Systolic function was    normal. The estimated ejection fraction was in the range of 60%    to 65%. Wall motion was normal; there were no regional wall    motion abnormalities. Features are consistent with a pseudonormal    left ventricular filling pattern, with concomitant abnormal    relaxation and increased filling pressure (grade 2 diastolic    dysfunction).  - Aortic valve: There was no regurgitation.  - Mitral valve: Transvalvular velocity was within the normal range.    There was no evidence for stenosis. There was no regurgitation.  - Right ventricle: The cavity size was normal. Wall thickness was    normal. Systolic function was normal.  - Tricuspid valve: There was no regurgitation.  - Inferior vena cava: The vessel was normal in size. The    respirophasic diameter changes were in the normal range  (>= 50%),    consistent with normal central venous pressure. __________   Nuclear stress test 08/13/2015: The left ventricular ejection fraction is normal (55-65%). Nuclear stress EF: 64%. There was no ST segment deviation noted during stress. The study is normal. No evidence of ischemia This is a low risk study.  Labs/Other Tests and Data Reviewed:    EKG:   As above  Recent Labs: 03/02/2021: Hemoglobin 15.9; Magnesium 2.3; Platelets 220 03/23/2021: TSH 4.01 04/12/2021: ALT 6; BUN 16; Creatinine, Ser 0.80; Potassium 4.7; Sodium 143   Recent Lipid Panel Lab Results  Component Value Date/Time   CHOL 228 (H) 04/12/2021 02:18 PM   TRIG 105 04/12/2021 02:18 PM   HDL 65 04/12/2021 02:18 PM   CHOLHDL 3.5 04/12/2021 02:18 PM   LDLCALC 145 (H)  04/12/2021 02:18 PM    Wt Readings from Last 3 Encounters:  04/12/21 122 lb (55.3 kg)  03/02/21 119 lb (54 kg)  08/27/20 122 lb (55.3 kg)     Risk Assessment/Calculations:   {Does this patient have ATRIAL FIBRILLATION?:[phone]}      Objective:    Vital Signs:  There were no vitals taken for this visit.   {HeartCare Virtual Exam (Optional):[phone]::"VITAL SIGNS:  reviewed"}  ASSESSMENT & PLAN:    Syncope:  Chest pain:  Carotid artery disease:  Elevated BP without the diagnosis of HTN:      {Are you ordering a CV Procedure (e.g. stress test, cath, DCCV, TEE, etc)?   Press F2        :[id number]}    Time:   Today, I have spent *** minutes with the patient with telehealth technology discussing the above problems.     Medication Adjustments/Labs and Tests Ordered: Current medicines are reviewed at length with the patient today.  Concerns regarding medicines are outlined above.   Tests Ordered: No orders of the defined types were placed in this encounter.   Medication Changes: No orders of the defined types were placed in this encounter.   Follow Up:  {F/U [phone]} {follow up:15908}  Signed, Christell Faith,  PA-C  06/10/2021 11:37 AM    [facility]

## 2021-06-09 NOTE — Telephone Encounter (Signed)
°  Patient Consent for Virtual Visit        Michaela Hall has provided verbal consent on 06/09/2021 for a virtual visit (video or telephone).   CONSENT FOR VIRTUAL VISIT FOR:  Michaela Hall  By participating in this virtual visit I agree to the following:  I hereby voluntarily request, consent and authorize [facility] and its employed or contracted physicians, physician assistants, nurse practitioners or other licensed health care professionals (the Practitioner), to provide me with telemedicine health care services (the Services") as deemed necessary by the treating Practitioner. I acknowledge and consent to receive the Services by the Practitioner via telemedicine. I understand that the telemedicine visit will involve communicating with the Practitioner through live audiovisual communication technology and the disclosure of certain medical information by electronic transmission. I acknowledge that I have been given the opportunity to request an in-person assessment or other available alternative prior to the telemedicine visit and am voluntarily participating in the telemedicine visit.  I understand that I have the right to withhold or withdraw my consent to the use of telemedicine in the course of my care at any time, without affecting my right to future care or treatment, and that the Practitioner or I may terminate the telemedicine visit at any time. I understand that I have the right to inspect all information obtained and/or recorded in the course of the telemedicine visit and may receive copies of available information for a reasonable fee.  I understand that some of the potential risks of receiving the Services via telemedicine include:  Delay or interruption in medical evaluation due to technological equipment failure or disruption; Information transmitted may not be sufficient (e.g. poor resolution of images) to allow for appropriate medical decision making by the Practitioner;  and/or  In rare instances, security protocols could fail, causing a breach of personal health information.  Furthermore, I acknowledge that it is my responsibility to provide information about my medical history, conditions and care that is complete and accurate to the best of my ability. I acknowledge that Practitioner's advice, recommendations, and/or decision may be based on factors not within their control, such as incomplete or inaccurate data provided by me or distortions of diagnostic images or specimens that may result from electronic transmissions. I understand that the practice of medicine is not an exact science and that Practitioner makes no warranties or guarantees regarding treatment outcomes. I acknowledge that a copy of this consent can be made available to me via my patient portal ([place]), or I can request a printed copy by calling the office of [facility].    I understand that my insurance will be billed for this visit.   I have read or had this consent read to me. I understand the contents of this consent, which adequately explains the benefits and risks of the Services being provided via telemedicine.  I have been provided ample opportunity to ask questions regarding this consent and the Services and have had my questions answered to my satisfaction. I give my informed consent for the services to be provided through the use of telemedicine in my medical care

## 2021-06-14 ENCOUNTER — Telehealth: Payer: Medicare Other | Admitting: Physician Assistant

## 2021-07-18 ENCOUNTER — Telehealth: Payer: Medicare Other | Admitting: Physician Assistant

## 2021-08-04 ENCOUNTER — Ambulatory Visit: Payer: Medicare Other | Admitting: Internal Medicine

## 2021-08-04 NOTE — Progress Notes (Unsigned)
Follow-up Outpatient Visit Date: 08/04/2021  Primary Care Provider: Vivi Barrack, [address] [place] [address]  Chief Complaint: ***  HPI:  Michaela Hall is a 66 y.o. 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  She was last seen in the office in 04/2021 by Christell Faith, PA.  At that time, she complained of generalized malaise, fatigue, and cold intolerance.  She had recently been diagnosed with autoimmune thyroiditis with recommendations to start levothyroxine.  She noted a further syncopal episode with reported blood pressure checked by her husband of 60/30.  She also reported some associated chest pressure with her syncopal episodes.  She was referred for coronary CTA, which showed mild, nonobstructive disease with 25% proximal LAD stenosis and coronary calcium score of 32.  --------------------------------------------------------------------------------------------------  Cardiovascular History & Procedures: Cardiovascular Problems: Syncope Raynaud's phenomenon   Risk Factors: Prior tobacco use and age >= 98   Cath/PCI: None   CV Surgery: None   EP Procedures and Devices: Event monitor (03/02/2021): Patient was monitored for 10 days, 18 hours.  Predominantly sinus rhythm with rare PACs and PVCs as well as single episode of PSVT lasting 4 beats.  No significant arrhythmia observed.   Non-Invasive Evaluation(s): Coronary CTA (05/02/2021): Mild, nonobstructive coronary artery disease with 25% proximal LAD stenosis.  No significant LMCA, LCx, or RCA disease.  Coronary calcium score 32.  Aortic atherosclerosis noted. Carotid Doppler (04/08/2021): Mild bilateral carotid plaquing with less than 45% stenoses in both internal carotid arteries.  Normal antegrade  flow in both vertebral arteries. TTE (04/08/2021): Normal LV size and wall thickness with LVEF 60-65%.  Grade 1 diastolic dysfunction with normal GLS (-20.0%).  Normal RV size and function.  Normal biatrial size.  No significant valvular abnormality.  Normal CVP. TTE (08/16/2015): Normal LV size.  LVEF 60-65% with normal wall motion.  Grade 2 diastolic dysfunction noted.  Normal RV size and function.  No significant valvular abnormality observed. Pharmacologic MPI (08/13/2015): Low risk study without ischemia or scar.  LVEF 55-65%.  Recent CV Pertinent Labs: Lab Results  Component Value Date   CHOL 228 (H) 04/12/2021   HDL 65 04/12/2021   LDLCALC 145 (H) 04/12/2021   TRIG 105 04/12/2021   CHOLHDL 3.5 04/12/2021   INR 0.97 08/08/2010   K 4.7 04/12/2021   MG 2.3 03/02/2021   BUN 16 04/12/2021   CREATININE 0.80 04/12/2021   CREATININE 0.88 12/25/2014    Past medical and surgical history were reviewed and updated in EPIC.  No outpatient medications have been marked as taking for the 08/04/21 encounter (Appointment) with Johnathan Tortorelli, Harrell Gave, MD.    Allergies: Gentamycin [gentamicin]  Social History   Tobacco Use   Smoking status: Former    Packs/day: 0.50    Years: 20.00    Pack years: 10.00    Types: Cigarettes    Quit date: 06/12/2014    Years since quitting: 7.1   Smokeless tobacco: Never  Vaping Use   Vaping Use: Some days   Devices: does not include nicotine  Substance Use Topics   Alcohol use: No    Alcohol/week: 0.0 standard drinks   Drug use: No    Family History  Problem Relation Age of Onset   Anesthesia problems Mother    Cancer Mother        ovary/uterus & breast  Hyperlipidemia Mother    Hypertension Mother    Subarachnoid hemorrhage Mother    Hyperlipidemia Father    Hypertension Father    Brain cancer Father    Breast cancer Maternal Aunt    Ovarian cancer Maternal Grandmother     Review of Systems: A 12-system review of systems was performed and was  negative except as noted in the HPI.  --------------------------------------------------------------------------------------------------  Physical Exam: There were no vitals taken for this visit.  General:  NAD. Neck: No JVD or HJR. Lungs: Clear to auscultation bilaterally without wheezes or crackles. Heart: Regular rate and rhythm without murmurs, rubs, or gallops. Abdomen: Soft, nontender, nondistended. Extremities: No lower extremity edema.  EKG:  ***  Lab Results  Component Value Date   WBC 7.6 03/02/2021   HGB 15.9 03/02/2021   HCT 48.1 (H) 03/02/2021   MCV 90 03/02/2021   PLT 220 03/02/2021    Lab Results  Component Value Date   NA 143 04/12/2021   K 4.7 04/12/2021   CL 105 04/12/2021   CO2 26 04/12/2021   BUN 16 04/12/2021   CREATININE 0.80 04/12/2021   GLUCOSE 103 (H) 04/12/2021   ALT 6 04/12/2021    Lab Results  Component Value Date   CHOL 228 (H) 04/12/2021   HDL 65 04/12/2021   LDLCALC 145 (H) 04/12/2021   TRIG 105 04/12/2021   CHOLHDL 3.5 04/12/2021    --------------------------------------------------------------------------------------------------  ASSESSMENT AND PLAN: Nelva Bush, MD 08/04/2021 7:47 AM

## 2021-08-24 ENCOUNTER — Ambulatory Visit: Admit: 2021-08-24 | Payer: PRIVATE HEALTH INSURANCE | Admitting: Orthopedic Surgery

## 2021-08-24 DIAGNOSIS — M1611 Unilateral primary osteoarthritis, right hip: Secondary | ICD-10-CM

## 2021-08-24 SURGERY — ARTHROPLASTY, HIP, TOTAL, ANTERIOR APPROACH
Anesthesia: Choice | Site: Hip | Laterality: Right

## 2021-09-05 ENCOUNTER — Other Ambulatory Visit: Payer: Self-pay | Admitting: Family Medicine

## 2021-09-05 NOTE — Telephone Encounter (Signed)
Last visit: 03/23/21 VV ? ?Next visit: N/A ? ?Last filled: 06/08/21 ? ?Quantity: 60 w/ 2 refills  ?

## 2021-10-06 ENCOUNTER — Other Ambulatory Visit: Payer: Self-pay | Admitting: Internal Medicine

## 2021-10-06 MED ORDER — ATORVASTATIN CALCIUM 40 MG PO TABS: 40.0000 mg | ORAL_TABLET | Freq: Every day | ORAL | 3 refills | Status: DC

## 2021-10-28 ENCOUNTER — Other Ambulatory Visit (HOSPITAL_COMMUNITY): Payer: Medicare Other

## 2021-11-09 DIAGNOSIS — M1611 Unilateral primary osteoarthritis, right hip: Secondary | ICD-10-CM

## 2021-12-14 ENCOUNTER — Telehealth: Payer: Self-pay | Admitting: Family Medicine

## 2021-12-14 NOTE — Telephone Encounter (Signed)
Spoke with patient she req a CB 01/02/22

## 2021-12-22 ENCOUNTER — Other Ambulatory Visit: Payer: Self-pay | Admitting: Family Medicine

## 2022-01-04 ENCOUNTER — Telehealth: Payer: Self-pay | Admitting: Family Medicine

## 2022-01-04 ENCOUNTER — Other Ambulatory Visit (HOSPITAL_COMMUNITY): Payer: Medicare Other

## 2022-01-04 NOTE — Telephone Encounter (Signed)
Copied from [place]. Topic: Medicare AWV >> Jan 04, 2022  2:36 PM Devoria Glassing wrote: Reason for CRM: Left message for patient to schedule Annual Wellness Visit.  Please schedule with Nurse Health Advisor Charlott Rakes, RN at [facility]. This appt can be telephone or office visit. Please call [phone] ask for [facility]

## 2022-01-11 ENCOUNTER — Ambulatory Visit: Admit: 2022-01-11 | Payer: Medicare Other | Admitting: Orthopedic Surgery

## 2022-01-11 DIAGNOSIS — M1611 Unilateral primary osteoarthritis, right hip: Secondary | ICD-10-CM

## 2022-01-11 SURGERY — ARTHROPLASTY, HIP, TOTAL, ANTERIOR APPROACH
Anesthesia: Choice | Site: Hip | Laterality: Right

## 2022-01-23 ENCOUNTER — Encounter: Payer: Self-pay | Admitting: Family Medicine

## 2022-01-23 ENCOUNTER — Telehealth (INDEPENDENT_AMBULATORY_CARE_PROVIDER_SITE_OTHER): Payer: Medicare Other | Admitting: Family Medicine

## 2022-01-23 VITALS — Ht 60.0 in | Wt 110.0 lb

## 2022-01-23 DIAGNOSIS — F411 Generalized anxiety disorder: Secondary | ICD-10-CM

## 2022-01-23 MED ORDER — CLONAZEPAM 1 MG PO TABS: 1.0000 mg | ORAL_TABLET | Freq: Three times a day (TID) | ORAL | 5 refills | Status: DC | PRN

## 2022-01-23 NOTE — Progress Notes (Signed)
   Michaela Hall is a 66 y.o. female who presents today for a virtual office visit.  Assessment/Plan:  Chronic Problems Addressed Today: Anxiety state Controlled.  She has been under a lot of stress recently due to the health of her husband who has been dealing with stage IV lung cancer.  She is currently on clonazepam 1 mg twice daily which works well for feels like it wears off of it too soon.  She is also on Remeron 30 mg daily per Dr Nelva Bush.  Given her significantly stressful life events would be reasonable to increase her clonazepam to 3 mg total daily.  We will send in a prescription that allows her to take 3 times daily.  We also discussed SSRIs versus increasing dose of mirtazapine however she deferred for now.  She has been on several SSRIs in the past without much benefit.  May consider trial of buspirone in the future if anxiety continues to be bothersome.     Subjective:  HPI:  See A/p for status of chronic conditions.         Objective/Observations  Physical Exam: Gen: NAD, resting comfortably Pulm: Normal work of breathing Neuro: Grossly normal, moves all extremities Psych: Normal affect and thought content  Virtual Visit via Video   I connected with Michaela Hall on 01/23/22 at 11:40 AM EDT by a video enabled telemedicine application and verified that I am speaking with the correct person using two identifiers. The limitations of evaluation and management by telemedicine and the availability of in person appointments were discussed. The patient expressed understanding and agreed to proceed.   Patient location: Home Provider location: [place] participating in the virtual visit: Myself and Patient     [name]. [name], MD 01/23/2022 11:57 AM

## 2022-01-23 NOTE — Assessment & Plan Note (Signed)
Controlled.  She has been under a lot of stress recently due to the health of her husband who has been dealing with stage IV lung cancer.  She is currently on clonazepam 1 mg twice daily which works well for feels like it wears off of it too soon.  She is also on Remeron 30 mg daily per Dr Nelva Bush.  Given her significantly stressful life events would be reasonable to increase her clonazepam to 3 mg total daily.  We will send in a prescription that allows her to take 3 times daily.  We also discussed SSRIs versus increasing dose of mirtazapine however she deferred for now.  She has been on several SSRIs in the past without much benefit.  May consider trial of buspirone in the future if anxiety continues to be bothersome.

## 2022-03-06 ENCOUNTER — Encounter: Payer: Self-pay | Admitting: *Deleted

## 2022-03-22 LAB — HM COLONOSCOPY

## 2022-04-20 ENCOUNTER — Ambulatory Visit (INDEPENDENT_AMBULATORY_CARE_PROVIDER_SITE_OTHER): Payer: Medicare Other

## 2022-04-20 VITALS — Wt 110.0 lb

## 2022-04-20 DIAGNOSIS — Z0000 Encounter for general adult medical examination without abnormal findings: Secondary | ICD-10-CM | POA: Diagnosis not present

## 2022-04-20 NOTE — Patient Instructions (Signed)
Michaela Hall , Thank you for taking time to come for your Medicare Wellness Visit. I appreciate your ongoing commitment to your health goals. Please review the following plan we discussed and let me know if I can assist you in the future.   These are the goals we discussed:  Goals   None     This is a list of the screening recommended for you and due dates:  Health Maintenance  Topic Date Due   Hepatitis C Screening: USPSTF Recommendation to screen - Ages 80-79 yo.  Never done   Tetanus Vaccine  Never done   Zoster (Shingles) Vaccine (1 of 2) Never done   Colon Cancer Screening  10/03/2016   Mammogram  11/23/2017   COVID-19 Vaccine (3 - Pfizer series) 11/30/2019   Pneumonia Vaccine (1 - PCV) Never done   Flu Shot  Never done   Medicare Annual Wellness Visit  04/21/2023   DEXA scan (bone density measurement)  Completed   HPV Vaccine  Aged Out    Advanced directives: Please bring a copy of your health care power of attorney and living will to the office at your convenience.   Conditions/risks identified: exercise   Next appointment: Follow up in one year for your annual wellness visit    Preventive Care 65 Years and Older, Female Preventive care refers to lifestyle choices and visits with your health care provider that can promote health and wellness. What does preventive care include? A yearly physical exam. This is also called an annual well check. Dental exams once or twice a year. Routine eye exams. Ask your health care provider how often you should have your eyes checked. Personal lifestyle choices, including: Daily care of your teeth and gums. Regular physical activity. Eating a healthy diet. Avoiding tobacco and drug use. Limiting alcohol use. Practicing safe sex. Taking low-dose aspirin every day. Taking vitamin and mineral supplements as recommended by your health care provider. What happens during an annual well check? The services and screenings done by your  health care provider during your annual well check will depend on your age, overall health, lifestyle risk factors, and family history of disease. Counseling  Your health care provider may ask you questions about your: Alcohol use. Tobacco use. Drug use. Emotional well-being. Home and relationship well-being. Sexual activity. Eating habits. History of falls. Memory and ability to understand (cognition). Work and work Statistician. Reproductive health. Screening  You may have the following tests or measurements: Height, weight, and BMI. Blood pressure. Lipid and cholesterol levels. These may be checked every 5 years, or more frequently if you are over 37 years old. Skin check. Lung cancer screening. You may have this screening every year starting at age 55 if you have a 30-pack-year history of smoking and currently smoke or have quit within the past 15 years. Fecal occult blood test (FOBT) of the stool. You may have this test every year starting at age 12. Flexible sigmoidoscopy or colonoscopy. You may have a sigmoidoscopy every 5 years or a colonoscopy every 10 years starting at age 80. Hepatitis C blood test. Hepatitis B blood test. Sexually transmitted disease (STD) testing. Diabetes screening. This is done by checking your blood sugar (glucose) after you have not eaten for a while (fasting). You may have this done every 1-3 years. Bone density scan. This is done to screen for osteoporosis. You may have this done starting at age 22. Mammogram. This may be done every 1-2 years. Talk to your health care  provider about how often you should have regular mammograms. Talk with your health care provider about your test results, treatment options, and if necessary, the need for more tests. Vaccines  Your health care provider may recommend certain vaccines, such as: Influenza vaccine. This is recommended every year. Tetanus, diphtheria, and acellular pertussis (Tdap, Td) vaccine. You may  need a Td booster every 10 years. Zoster vaccine. You may need this after age 40. Pneumococcal 13-valent conjugate (PCV13) vaccine. One dose is recommended after age 87. Pneumococcal polysaccharide (PPSV23) vaccine. One dose is recommended after age 13. Talk to your health care provider about which screenings and vaccines you need and how often you need them. This information is not intended to replace advice given to you by your health care provider. Make sure you discuss any questions you have with your health care provider. Document Released: 06/25/2015 Document Revised: 02/16/2016 Document Reviewed: 03/30/2015 Elsevier Interactive Patient Education  2017 [place] Prevention in the Home Falls can cause injuries. They can happen to people of all ages. There are many things you can do to make your home safe and to help prevent falls. What can I do on the outside of my home? Regularly fix the edges of walkways and driveways and fix any cracks. Remove anything that might make you trip as you walk through a door, such as a raised step or threshold. Trim any bushes or trees on the path to your home. Use bright outdoor lighting. Clear any walking paths of anything that might make someone trip, such as rocks or tools. Regularly check to see if handrails are loose or broken. Make sure that both sides of any steps have handrails. Any raised decks and porches should have guardrails on the edges. Have any leaves, snow, or ice cleared regularly. Use sand or salt on walking paths during winter. Clean up any spills in your garage right away. This includes oil or grease spills. What can I do in the bathroom? Use night lights. Install grab bars by the toilet and in the tub and shower. Do not use towel bars as grab bars. Use non-skid mats or decals in the tub or shower. If you need to sit down in the shower, use a plastic, non-slip stool. Keep the floor dry. Clean up any water that spills on  the floor as soon as it happens. Remove soap buildup in the tub or shower regularly. Attach bath mats securely with double-sided non-slip rug tape. Do not have throw rugs and other things on the floor that can make you trip. What can I do in the bedroom? Use night lights. Make sure that you have a light by your bed that is easy to reach. Do not use any sheets or blankets that are too big for your bed. They should not hang down onto the floor. Have a firm chair that has side arms. You can use this for support while you get dressed. Do not have throw rugs and other things on the floor that can make you trip. What can I do in the kitchen? Clean up any spills right away. Avoid walking on wet floors. Keep items that you use a lot in easy-to-reach places. If you need to reach something above you, use a strong step stool that has a grab bar. Keep electrical cords out of the way. Do not use floor polish or wax that makes floors slippery. If you must use wax, use non-skid floor wax. Do not have throw rugs  and other things on the floor that can make you trip. What can I do with my stairs? Do not leave any items on the stairs. Make sure that there are handrails on both sides of the stairs and use them. Fix handrails that are broken or loose. Make sure that handrails are as long as the stairways. Check any carpeting to make sure that it is firmly attached to the stairs. Fix any carpet that is loose or worn. Avoid having throw rugs at the top or bottom of the stairs. If you do have throw rugs, attach them to the floor with carpet tape. Make sure that you have a light switch at the top of the stairs and the bottom of the stairs. If you do not have them, ask someone to add them for you. What else can I do to help prevent falls? Wear shoes that: Do not have high heels. Have rubber bottoms. Are comfortable and fit you well. Are closed at the toe. Do not wear sandals. If you use a stepladder: Make sure  that it is fully opened. Do not climb a closed stepladder. Make sure that both sides of the stepladder are locked into place. Ask someone to hold it for you, if possible. Clearly mark and make sure that you can see: Any grab bars or handrails. First and last steps. Where the edge of each step is. Use tools that help you move around (mobility aids) if they are needed. These include: Canes. Walkers. Scooters. Crutches. Turn on the lights when you go into a dark area. Replace any light bulbs as soon as they burn out. Set up your furniture so you have a clear path. Avoid moving your furniture around. If any of your floors are uneven, fix them. If there are any pets around you, be aware of where they are. Review your medicines with your doctor. Some medicines can make you feel dizzy. This can increase your chance of falling. Ask your doctor what other things that you can do to help prevent falls. This information is not intended to replace advice given to you by your health care provider. Make sure you discuss any questions you have with your health care provider. Document Released: 03/25/2009 Document Revised: 11/04/2015 Document Reviewed: 07/03/2014 Elsevier Interactive Patient Education  2017 [employer].

## 2022-04-20 NOTE — Progress Notes (Signed)
I connected with  Michaela Hall on 04/20/22 by a audio enabled telemedicine application and verified that I am speaking with the correct person using two identifiers.  Patient Location: Home  Provider Location: Office/Clinic  I discussed the limitations of evaluation and management by telemedicine. The patient expressed understanding and agreed to proceed.   Subjective:   Michaela Hall is a 66 y.o. female who presents for an Initial Medicare Annual Wellness Visit.  Review of Systems     Cardiac Risk Factors include: advanced age (>80mn, >>59women)     Objective:    Today's Vitals   04/20/22 1257  Weight: 110 lb (49.9 kg)   Body mass index is 21.48 kg/m.     04/20/2022    1:05 PM 07/23/2020    1:19 AM 09/28/2011    9:00 AM  Advanced Directives  Does Patient Have a Medical Advance Directive? Yes No Patient does not have advance directive  Type of AScientist, forensicPower of [place] will    Copy of [place]? No - copy requested    Would patient like information on creating a medical advance directive?  No - Patient declined   Pre-existing out of facility DNR order (yellow form or pink MOST form)   No    Current Medications (verified) Outpatient Encounter Medications as of 04/20/2022  Medication Sig   aspirin EC 81 MG tablet Take 1 tablet (81 mg total) by mouth daily. Swallow whole.   atorvastatin (LIPITOR) 40 MG tablet Take 1 tablet (40 mg total) by mouth daily.   clonazePAM (KLONOPIN) 1 MG tablet Take 1 tablet (1 mg total) by mouth 3 (three) times daily as needed for anxiety.   estradiol (ESTRACE) 1 MG tablet Take 1 tablet by mouth daily.   levothyroxine (SYNTHROID) 25 MCG tablet Take 1 tablet (25 mcg total) by mouth daily.   mirtazapine (REMERON) 15 MG tablet Take 15 mg by mouth at bedtime.   ondansetron (ZOFRAN-ODT) 8 MG disintegrating tablet Take 1 tablet by mouth 3 (three) times daily. Take 1 tab by mouth tid    Oxycodone HCl 10 MG TABS Take 10 mg by mouth every 4 (four) hours as needed (pain).   progesterone (PROMETRIUM) 200 MG capsule Take 200 mg by mouth daily.   gabapentin (NEURONTIN) 300 MG capsule Take 300 mg by mouth 3 (three) times daily. (Patient not taking: Reported on 04/20/2022)   naloxone ([facility] nasal spray 4 mg/0.1 mL    No facility-administered encounter medications on file as of 04/20/2022.    Allergies (verified) Gentamycin [gentamicin]   History: Past Medical History:  Diagnosis Date   Anxiety    Cervical spondylosis without myelopathy 10/21/2015   Cervical spondylosis without myelopathy 10/21/2015   Chronic pain    back and right hip   Complication of anesthesia    Depression    DJD (degenerative joint disease)    Hashimoto's disease    Headache(784.0)    PONV (postoperative nausea and vomiting)    Raynaud disease    Raynaud's disease    Weakness of left arm 10/21/2015   Past Surgical History:  Procedure Laterality Date   BACK SURGERY     ESOPHAGOGASTRODUODENOSCOPY  09/28/2011   Procedure: ESOPHAGOGASTRODUODENOSCOPY (EGD);  Surgeon: JWinfield Cunas, MD;  [facility];  Service: Endoscopy;  Laterality: N/A;   HIP SURGERY     KNEE SURGERY     SPINE SURGERY     TONSILLECTOMY  Family History  Problem Relation Age of Onset   Anesthesia problems Mother    Cancer Mother        ovary/uterus & breast   Hyperlipidemia Mother    Hypertension Mother    Subarachnoid hemorrhage Mother    Hyperlipidemia Father    Hypertension Father    Brain cancer Father    Breast cancer Maternal Aunt    Ovarian cancer Maternal Grandmother    Social History   Socioeconomic History   Marital status: Married    Spouse name: Not on file   Number of children: 3   Years of education: Not on file   Highest education level: Not on file  Occupational History   Occupation: Medicare ins. Scientist, clinical (histocompatibility and immunogenetics): [employer]  Tobacco Use   Smoking status: Former     Packs/day: 0.50    Years: 20.00    Total pack years: 10.00    Types: Cigarettes    Quit date: 06/12/2014    Years since quitting: 7.8   Smokeless tobacco: Never  Vaping Use   Vaping Use: Some days   Devices: does not include nicotine  Substance and Sexual Activity   Alcohol use: No    Alcohol/week: 0.0 standard drinks of alcohol   Drug use: No   Sexual activity: Not on file  Other Topics Concern   Not on file  Social History Narrative   Lives at home w/ her husband   Right-handed   Very little caffeine use, drinks 7up   Last colon screening: 09/2011   Social Determinants of Health   Financial Resource Strain: Low Risk  (04/20/2022)   Overall Financial Resource Strain (CARDIA)    Difficulty of Paying Living Expenses: Not hard at all  Food Insecurity: No Food Insecurity (04/20/2022)   Hunger Vital Sign    Worried About Running Out of Food in the Last Year: Never true    Ran Out of Food in the Last Year: Never true  Transportation Needs: No Transportation Needs (04/20/2022)   PRAPARE - Hydrologist (Medical): No    Lack of Transportation (Non-Medical): No  Physical Activity: Inactive (04/20/2022)   Exercise Vital Sign    Days of Exercise per Week: 0 days    Minutes of Exercise per Session: 0 min  Stress: Stress Concern Present (04/20/2022)   [place]    Feeling of Stress : To some extent  Social Connections: Moderately Integrated (04/20/2022)   Social Connection and Isolation Panel [NHANES]    Frequency of Communication with Friends and Family: More than three times a week    Frequency of Social Gatherings with Friends and Family: Once a week    Attends Religious Services: More than 4 times per year    Active Member of [employer] or Organizations: No    Attends Music therapist: Never    Marital Status: Married    Tobacco Counseling Counseling given: Not  Answered   Clinical Intake:  Pre-visit preparation completed: Yes  Pain : No/denies pain     BMI - recorded: 21.48 Nutritional Status: BMI of 19-24  Normal Nutritional Risks: None Diabetes: No  How often do you need to have someone help you when you read instructions, pamphlets, or other written materials from your doctor or pharmacy?: 1 - Never  Diabetic?no  Interpreter Needed?: No  Information entered by :: Charlott Rakes, LPN   Activities of Daily Living  04/20/2022    1:06 PM  In your present state of health, do you have any difficulty performing the following activities:  Hearing? 0  Vision? 0  Difficulty concentrating or making decisions? 0  Walking or climbing stairs? 1  Comment difficult at times  Dressing or bathing? 0  Doing errands, shopping? 0  Preparing Food and eating ? N  Using the Toilet? N  In the past six months, have you accidently leaked urine? N  Do you have problems with loss of bowel control? N  Managing your Medications? N  Managing your Finances? N  Housekeeping or managing your Housekeeping? N    Patient Care Team: Vivi Barrack, MD as PCP - General (Family Medicine) End, Harrell Gave, MD as PCP - Cardiology (Cardiology) Suella Broad, MD as Consulting Physician (Physical Medicine and Rehabilitation)  Indicate any recent Medical Services you may have received from other than [facility] providers in the past year (date may be approximate).     Assessment:   This is a routine wellness examination for Michaela Hall.  Hearing/Vision screen Hearing Screening - Comments:: Pt denies any hearing  Vision Screening - Comments:: Pt follows up with Dr Katy Fitch for annual eye exams   Dietary issues and exercise activities discussed: Current Exercise Habits: The patient does not participate in regular exercise at present   Goals Addressed             This Visit's Progress    Patient Stated       Exercise more        Depression Screen     04/20/2022    1:01 PM 01/23/2022   11:24 AM 03/23/2021   11:31 AM 08/27/2020    3:32 PM 11/08/2016    9:15 AM 09/15/2015   11:00 AM 09/04/2015    3:24 PM  PHQ 2/9 Scores  PHQ - 2 Score 1 0 0 0 0 0 0    Fall Risk    04/20/2022    1:06 PM 01/23/2022   11:24 AM 11/08/2016    9:15 AM  [place] in the past year? 0 0 No  Number falls in past yr: 0 0   Injury with Fall? 0 0   Risk for fall due to : Impaired vision;Impaired mobility    Follow up Falls prevention discussed      FALL RISK PREVENTION PERTAINING TO THE HOME:  Any stairs in or around the home? Yes  If so, are there any without handrails? No  Home free of loose throw rugs in walkways, pet beds, electrical cords, etc? Yes  Adequate lighting in your home to reduce risk of falls? Yes   ASSISTIVE DEVICES UTILIZED TO PREVENT FALLS:  Life alert? No  Use of a cane, walker or w/c? No  Grab bars in the bathroom? Yes  Shower chair or bench in shower? Yes  Elevated toilet seat or a handicapped toilet? Yes   TIMED UP AND GO:  Was the test performed? No .  Cognitive Function:        04/20/2022    1:08 PM  6CIT Screen  What Year? 0 points  What month? 0 points  What time? 0 points  Count back from 20 0 points  Months in reverse 0 points  Repeat phrase 0 points  Total Score 0 points    Immunizations Immunization History  Administered Date(s) Administered   PFIZER(Purple Top)SARS-COV-2 Vaccination 08/22/2019, 10/05/2019    TDAP status: Due, Education has been provided regarding the  importance of this vaccine. Advised may receive this vaccine at local pharmacy or Health Dept. Aware to provide a copy of the vaccination record if obtained from local pharmacy or Health Dept. Verbalized acceptance and understanding.  Flu Vaccine status: Declined, Education has been provided regarding the importance of this vaccine but patient still declined. Advised may receive this vaccine at local pharmacy or Health Dept. Aware to  provide a copy of the vaccination record if obtained from local pharmacy or Health Dept. Verbalized acceptance and understanding.  Pneumococcal vaccine status: Declined,  Education has been provided regarding the importance of this vaccine but patient still declined. Advised may receive this vaccine at local pharmacy or Health Dept. Aware to provide a copy of the vaccination record if obtained from local pharmacy or Health Dept. Verbalized acceptance and understanding.   Covid-19 vaccine status: Completed vaccines  Qualifies for Shingles Vaccine? Yes   Zostavax completed No   Shingrix Completed?: No.    Education has been provided regarding the importance of this vaccine. Patient has been advised to call insurance company to determine out of pocket expense if they have not yet received this vaccine. Advised may also receive vaccine at local pharmacy or Health Dept. Verbalized acceptance and understanding.  Screening Tests Health Maintenance  Topic Date Due   Hepatitis C Screening  Never done   TETANUS/TDAP  Never done   Zoster Vaccines- Shingrix (1 of 2) Never done   COLONOSCOPY (Pts 45-52yr Insurance coverage will need to be confirmed)  10/03/2016   MAMMOGRAM  11/23/2017   COVID-19 Vaccine (3 - Pfizer series) 11/30/2019   Pneumonia Vaccine 66 Years old (1 - PCV) Never done   INFLUENZA VACCINE  Never done   Medicare Annual Wellness (AWV)  04/21/2023   DEXA SCAN  Completed   HPV VACCINES  Aged Out    Health Maintenance  Health Maintenance Due  Topic Date Due   Hepatitis C Screening  Never done   TETANUS/TDAP  Never done   Zoster Vaccines- Shingrix (1 of 2) Never done   COLONOSCOPY (Pts 45-436yrInsurance coverage will need to be confirmed)  10/03/2016   MAMMOGRAM  11/23/2017   COVID-19 Vaccine (3 - Pfizer series) 11/30/2019   Pneumonia Vaccine 6581Years old (1 - PCV) Never done   INFLUENZA VACCINE  Never done    Colorectal cancer screening: Type of screening: Colonoscopy.  Completed 03/22/22. Repeat every pt will call for a follow up appt  years  Mammogram status: Completed 11/24/15. Repeat every year  Bone density 11/24/15   Additional Screening:  Hepatitis C Screening: does qualify;  Vision Screening: Recommended annual ophthalmology exams for early detection of glaucoma and other disorders of the eye. Is the patient up to date with their annual eye exam?  Yes  Who is the provider or what is the name of the office in which the patient attends annual eye exams? Dr GrKaty FitchIf pt is not established with a provider, would they like to be referred to a provider to establish care? No .   Dental Screening: Recommended annual dental exams for proper oral hygiene  Community Resource Referral / Chronic Care Management: CRR required this visit?  No   CCM required this visit?  No      Plan:     I have personally reviewed and noted the following in the patient's chart:   Medical and social history Use of alcohol, tobacco or illicit drugs  Current medications and supplements including opioid prescriptions. Patient  is currently taking opioid prescriptions. Information provided to patient regarding non-opioid alternatives. Patient advised to discuss non-opioid treatment plan with their provider. Functional ability and status Nutritional status Physical activity Advanced directives List of other physicians Hospitalizations, surgeries, and ER visits in previous 12 months Vitals Screenings to include cognitive, depression, and falls Referrals and appointments  In addition, I have reviewed and discussed with patient certain preventive protocols, quality metrics, and best practice recommendations. A written personalized care plan for preventive services as well as general preventive health recommendations were provided to patient.     Willette Brace, LPN   [id number]   Nurse Notes: none

## 2022-05-25 ENCOUNTER — Encounter: Payer: Self-pay | Admitting: *Deleted

## 2022-08-10 ENCOUNTER — Encounter: Payer: Self-pay | Admitting: Family Medicine

## 2022-08-10 ENCOUNTER — Ambulatory Visit (INDEPENDENT_AMBULATORY_CARE_PROVIDER_SITE_OTHER): Payer: Medicare Other | Admitting: Family Medicine

## 2022-08-10 VITALS — BP 129/83 | HR 97 | Temp 97.5°F | Ht 60.0 in | Wt 114.4 lb

## 2022-08-10 DIAGNOSIS — E039 Hypothyroidism, unspecified: Secondary | ICD-10-CM

## 2022-08-10 DIAGNOSIS — F411 Generalized anxiety disorder: Secondary | ICD-10-CM | POA: Diagnosis not present

## 2022-08-10 DIAGNOSIS — K5900 Constipation, unspecified: Secondary | ICD-10-CM | POA: Diagnosis not present

## 2022-08-10 DIAGNOSIS — K3184 Gastroparesis: Secondary | ICD-10-CM | POA: Diagnosis not present

## 2022-08-10 MED ORDER — CLONAZEPAM 1 MG PO TABS: 1.0000 mg | ORAL_TABLET | Freq: Three times a day (TID) | ORAL | 5 refills | Status: DC | PRN

## 2022-08-10 NOTE — Assessment & Plan Note (Signed)
She has previously been following with [facility] and has previously been using Zofran as needed.  She is now questioning if her gastroparesis was an accurate diagnosis and is concerned that she may have chronic constipation as the cause of her symptoms instead.  She does have a referral to [facility] pending.

## 2022-08-10 NOTE — Progress Notes (Signed)
Michaela Hall is a 67 y.o. female who presents today for an office visit.  Assessment/Plan:  Chronic Problems Addressed Today: Anxiety state Symptoms are not controlled.  She has been under a significant amount of stress recently both due to the declining health of her husband with stage IV lung cancer as well as her own health with recent concern for possible bowel impaction or potentially colon cancer.  She does have a therapist and feels like this is going very well and she will continue to see them.  We did discuss medication adjustments.  She feels like clonazepam is effective however does wear off early.  She is very hesitant to try another SSRI.  She has tried several SSRIs in the past which have not been effective.  She is also reluctant to increase her dose of mirtazapine due to concern for potential side effects.  Given her extreme elevation anxiety would be reasonable for her to take her clonazepam 1 mg 3 times daily.  She can continue taking her Remeron 15 mg nightly.  She can follow-up with me in a few weeks via MyChart.  Would consider addition of buspirone at some point in the future if continues have significant issues with anxiety will hopefully as she gets her GI work up this will help alleviate some of her anxiety.  Gastroparesis She has previously been following with [facility] and has previously been using Zofran as needed.  She is now questioning if her gastroparesis was an accurate diagnosis and is concerned that she may have chronic constipation as the cause of her symptoms instead.  She does have a referral to [name] pending.  Constipation Likely multifactorial in setting of age-related induced constipation as well as hypothyroidism. Her orthopedist, Dr Michaela Hall recently started her on Amitiza which does seem to be working well though she does have an upcoming appointment with GI for further evaluation and management.  She is not sure if her bowels have been cleaned out.     Her colonoscopy earlier this year was incomplete due to poor prep and she is very worried about underlying colon cancer that was not able to be diagnosed due to an elevated CEA that was checked by her endocrinologist.  Hypothyroidism Diagnosed with Hashimoto's disease.  She is on Synthroid and follows with endocrinology.     Subjective:  HPI:  See A/p of status of chronic conditions.    Patient is here today for follow-up.  She is currently under a lot of stress.  She went for a repeat colonoscopy 2 months ago with [facility].  Unfortunately this was not able to be completed due to incomplete prep and bowel obstruction.  Patient states that they were only able to go about 1 foot into her colon before stopping.  She did have 2 polyps removed.  She was not told any specific follow-up for this.  She saw her physical medicine physician several weeks ago, Dr Michaela Hall, who told her that she had a 25 foot impaction in her intestines.  He told her that she was having internal toxicity and that she needed to have a complete bowel cleanout.  Told her that her diagnosis of "gastroparesis" may be an accurate and that her persistent nausea and vomiting could have been due to chronic constipation instead. He started on Amitiza much has been working well however she is not sure if her impaction has been cleared.  She has been following with endocrinology, Dr Michaela Hall,  for hypothyroidism issues  and discussed the above issues with her.  Apparently a CEA level was checked which was elevated and her endocrinologist told her that she either has colon cancer or uterine cancer.  Patient is extremely stressed about this.  She has also been dealing with her declining health of her husband who has been diagnosed with lung cancer and now has metastatic disease.  She does have a therapist and has been following with them routinely.  This seems to be going well.  She takes clonazepam 1 mg twice daily as needed.  She also takes  Remeron 15 mg nightly.       Objective:  Physical Exam: BP 129/83   Pulse 97   Temp (!) 97.5 F (36.4 C) (Temporal)   Ht 5' (1.524 m)   Wt 114 lb 6.4 oz (51.9 kg)   SpO2 97%   BMI 22.34 kg/m   Gen: No acute distress, resting comfortably CV: Regular rate and rhythm with no murmurs appreciated Pulm: Normal work of breathing, clear to auscultation bilaterally with no crackles, wheezes, or rhonchi Neuro: Grossly normal, moves all extremities Psych: Normal affect and thought content  Time Spent: 40 minutes of total time was spent on the date of the encounter performing the following actions: chart review prior to seeing the patient, obtaining history, performing a medically necessary exam, counseling on the treatment plan, placing orders, and documenting in our EHR.        [name]. [name], MD 08/10/2022 10:40 AM

## 2022-08-10 NOTE — Assessment & Plan Note (Signed)
Likely multifactorial in setting of age-related induced constipation as well as hypothyroidism. Her orthopedist, Dr Nelva Bush recently started her on Amitiza which does seem to be working well though she does have an upcoming appointment with GI for further evaluation and management.  She is not sure if her bowels have been cleaned out.    Her colonoscopy earlier this year was incomplete due to poor prep and she is very worried about underlying colon cancer that was not able to be diagnosed due to an elevated CEA that was checked by her endocrinologist.

## 2022-08-10 NOTE — Assessment & Plan Note (Signed)
Symptoms are not controlled.  She has been under a significant amount of stress recently both due to the declining health of her husband with stage IV lung cancer as well as her own health with recent concern for possible bowel impaction or potentially colon cancer.  She does have a therapist and feels like this is going very well and she will continue to see them.  We did discuss medication adjustments.  She feels like clonazepam is effective however does wear off early.  She is very hesitant to try another SSRI.  She has tried several SSRIs in the past which have not been effective.  She is also reluctant to increase her dose of mirtazapine due to concern for potential side effects.  Given her extreme elevation anxiety would be reasonable for her to take her clonazepam 1 mg 3 times daily.  She can continue taking her Remeron 15 mg nightly.  She can follow-up with me in a few weeks via MyChart.  Would consider addition of buspirone at some point in the future if continues have significant issues with anxiety will hopefully as she gets her GI work up this will help alleviate some of her anxiety.

## 2022-08-10 NOTE — Patient Instructions (Signed)
It was very nice to see you today!  We will refill your clonazepam.  You can take 1 mg 3 times daily.  Please call the gastroenterologist to see if they have received the paperwork.  Please let me know how you are doing in a few weeks.  Take care, Dr [name]  PLEASE NOTE:  If you had any lab tests, please let us know if you have not heard back within a few days. You may see your results on mychart before we have a chance to review them but we will give you a call once they are reviewed by [place].   If we ordered any referrals today, please let us know if you have not heard from their office within the next week.   If you had any urgent prescriptions sent in today, please check with the pharmacy within an hour of our visit to make sure the prescription was transmitted appropriately.   Please try these tips to maintain a healthy lifestyle:  Eat at least 3 REAL meals and 1-2 snacks per day.  Aim for no more than 5 hours between eating.  If you eat breakfast, please do so within one hour of getting up.   Each meal should contain half fruits/vegetables, one quarter protein, and one quarter carbs (no bigger than a computer mouse)  Cut down on sweet beverages. This includes juice, soda, and sweet tea.   Drink at least 1 glass of water with each meal and aim for at least 8 glasses per day  Exercise at least 150 minutes every week.

## 2022-08-10 NOTE — Assessment & Plan Note (Signed)
Diagnosed with Hashimoto's disease.  She is on Synthroid and follows with endocrinology.

## 2022-08-11 ENCOUNTER — Telehealth: Payer: Self-pay | Admitting: Gastroenterology

## 2022-08-11 NOTE — Telephone Encounter (Signed)
Good morning Dr Havery Moros   We have received a request from patient wanting to transfer care over to you from digestive health. Patient has a referral in for gastroparesis syndrome  Patient is wanting to establish care due to you being her son care taker as well as her previous gi doctor retiring.  Some of patient records are available for review in her chart as well as a procedure report I will be sending up.  Please review and advise on scheduling .  Thank you

## 2022-08-11 NOTE — Telephone Encounter (Signed)
I am happy to see her in the office.  Thanks

## 2022-08-17 ENCOUNTER — Telehealth: Payer: Self-pay | Admitting: Family Medicine

## 2022-08-17 NOTE — Telephone Encounter (Signed)
See note

## 2022-08-17 NOTE — Telephone Encounter (Signed)
Patient states she was able to get scheduled with GI for 11/07/22 but this is not soon enough. States she was told by PCP that this would be marked as urgent so she could get in sooner. Pt very concerned.

## 2022-08-18 NOTE — Telephone Encounter (Signed)
Is there any way to see if there are any sooner slots available?  [name]. [name], MD 08/18/2022 11:05 AM

## 2022-08-21 NOTE — Telephone Encounter (Signed)
LMOVM advising patient that I spoke with [facility]. This appt is considering transfer of care appt since she has already been est with [facility] and does have GI issues that have been diagnosed, she has to see an MD vs NP or PA. Office manager states that 5/28 is the soonest appt they have and that patient is also on wait list.

## 2022-08-30 ENCOUNTER — Encounter: Payer: Self-pay | Admitting: Gastroenterology

## 2022-08-30 ENCOUNTER — Other Ambulatory Visit (INDEPENDENT_AMBULATORY_CARE_PROVIDER_SITE_OTHER): Payer: Medicare Other

## 2022-08-30 ENCOUNTER — Ambulatory Visit (INDEPENDENT_AMBULATORY_CARE_PROVIDER_SITE_OTHER): Payer: Medicare Other | Admitting: Gastroenterology

## 2022-08-30 VITALS — BP 120/80 | HR 91 | Ht 60.0 in | Wt 118.0 lb

## 2022-08-30 DIAGNOSIS — R634 Abnormal weight loss: Secondary | ICD-10-CM

## 2022-08-30 DIAGNOSIS — R6881 Early satiety: Secondary | ICD-10-CM

## 2022-08-30 DIAGNOSIS — Z87891 Personal history of nicotine dependence: Secondary | ICD-10-CM

## 2022-08-30 DIAGNOSIS — K5909 Other constipation: Secondary | ICD-10-CM

## 2022-08-30 DIAGNOSIS — R112 Nausea with vomiting, unspecified: Secondary | ICD-10-CM

## 2022-08-30 HISTORY — DX: Abnormal weight loss: R63.4

## 2022-08-30 HISTORY — DX: Nausea with vomiting, unspecified: R11.2

## 2022-08-30 LAB — BASIC METABOLIC PANEL
BUN: 20 mg/dL (ref 6–23)
CO2: 29 mEq/L (ref 19–32)
Calcium: 9.2 mg/dL (ref 8.4–10.5)
Chloride: 101 mEq/L (ref 96–112)
Creatinine, Ser: 0.82 mg/dL (ref 0.40–1.20)
GFR: 74.38 mL/min (ref >60.00)
Glucose, Bld: 102 mg/dL — ABNORMAL HIGH (ref 70–99)
Potassium: 4.4 mEq/L (ref 3.5–5.1)
Sodium: 136 mEq/L (ref 135–145)

## 2022-08-30 NOTE — Patient Instructions (Addendum)
You will be contacted by [facility] in the next 2 days to arrange a CT Abdomen/Pelvis.  The number on your caller ID will be [phone], please answer when they call.  If you have not heard from them in 2 days please call [phone] to schedule.      Your provider has requested that you go to the basement level for lab work before leaving today. Press "B" on the elevator. The lab is located at the first door on the left as you exit the elevator.   Increase Amitiza to 3 pills (34mcg) AM and PM  Follow up with Dr Havery Moros  Due to recent changes in healthcare laws, you may see the results of your imaging and laboratory studies on MyChart before your provider has had a chance to review them.  We understand that in some cases there may be results that are confusing or concerning to you. Not all laboratory results come back in the same time frame and the provider may be waiting for multiple results in order to interpret others.  Please give [place] 48 hours in order for your provider to thoroughly review all the results before contacting the office for clarification of your results.    _______________________________________________________  If your blood pressure at your visit was 140/90 or greater, please contact your primary care physician to follow up on this.  _______________________________________________________  If you are age 13 or older, your body mass index should be between 23-30. Your Body mass index is 23.05 kg/m. If this is out of the aforementioned range listed, please consider follow up with your Primary Care Provider.  If you are age 41 or younger, your body mass index should be between 19-25. Your Body mass index is 23.05 kg/m. If this is out of the aformentioned range listed, please consider follow up with your Primary Care Provider.   ________________________________________________________  The [facility] providers would like to encourage you to use  [facility] to communicate with providers for non-urgent requests or questions.  Due to long hold times on the telephone, sending your provider a message by [facility] may be a faster and more efficient way to get a response.  Please allow 48 business hours for a response.  Please remember that this is for non-urgent requests.  _______________________________________________________   I appreciate the  opportunity to care for you  Thank You   Janett Billow Zehr,PA-C

## 2022-08-30 NOTE — Progress Notes (Signed)
08/30/2022 Michaela Hall [id number] 07-17-1955   HISTORY OF PRESENT ILLNESS:  This is a 67 year old female who was a patient of Dr. Lynne Leader dating back to 2013.  Then it was like she seen Dr. Cristina Gong in 2015 and then was referred to Dr. Roney Mans for gastroparesis in 2018.  Since Dr. Cristina Gong retired then she followed with Dr. Therisa Doyne and saw her for colonoscopy in 2023.  Now she is coming back here and has been accepted by Dr. Havery Moros.  Looks like she has a history of gastroparesis with a positive gastric emptying scan back in 2015.  She also reports issues with chronic constipation.  She is on chronic pain medication.  She reports having a colonoscopy somewhere in 2012 which showed stool throughout her colon, but Dr. Fuller Plan did a colonoscopy on her in April 2013 that was clear with only internal hemorrhoids and a nodule at the appendiceal orifice.  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  Colonoscopy in October 2023 by Dr. Deno Etienne showed a poor colon preparation, but she did reach the ileum that she said that the examined portion of the ileum was normal.  She did have stool throughout the entire examined colon.  She had five 6 to 15 mm polyps removed and nonbleeding internal hemorrhoids.  I do not have pathology on the colon polyps, but they recommended a follow-up colonoscopy again in a year.  She is on Amitiza 8 mcg twice daily, but says that that is not helping.  She is somewhat tearful today.  She says that she just does not feel well in general.  Says that she feels like she has cancer somewhere.  She says that she has lost a lot of weight.  We have her at 118 pounds here today, but she says that her scale at home said 107 pounds this morning.  In the past when she was dealing with thyroid issues before they were treated and at the beginning of her gastroparesis diagnosis she was down to 97 pounds at one point.  Currently for her  gastroparesis she is only using Zofran.  Does have nausea and vomiting every morning.  From notes of Dr. Rose Phi I found the following:  EGD 2/16 (assuming this was with Dr. Cristina Gong): 1. There was LA Class A esophagitis noted 2. The mucosa of the stomach appeared normal 3. The duodenal mucosa showed no abnormalities in the bulb and second portion of the duodenum and 3rd part duodenum [T2] 4. Retroflexion views revealed no abnormalities    Past Medical History:  Diagnosis Date   Anxiety    Cervical spondylosis without myelopathy 10/21/2015   Cervical spondylosis without myelopathy 10/21/2015   Chronic pain    back and right hip   Complication of anesthesia    Depression    DJD (degenerative joint disease)    Hashimoto's disease    Headache(784.0)    PONV (postoperative nausea and vomiting)    Raynaud disease    Raynaud's disease    Weakness of left arm 10/21/2015   Past Surgical History:  Procedure Laterality Date   BACK SURGERY     ESOPHAGOGASTRODUODENOSCOPY  09/28/2011   Procedure: ESOPHAGOGASTRODUODENOSCOPY (EGD);  Surgeon: Winfield Cunas., MD;  Location: [facility];  Service: Endoscopy;  Laterality: N/A;   HIP SURGERY     KNEE SURGERY     SPINE SURGERY     TONSILLECTOMY      reports that she quit  smoking about 8 years ago. Her smoking use included cigarettes. She has a 10.00 pack-year smoking history. She has never used smokeless tobacco. She reports that she does not drink alcohol and does not use drugs. family history includes Anesthesia problems in her mother; Brain cancer in her father; Breast cancer in her maternal aunt; Cancer in her mother; Hyperlipidemia in her father and mother; Hypertension in her father and mother; Ovarian cancer in her maternal grandmother; Subarachnoid hemorrhage in her mother. Allergies  Allergen Reactions   Gentamycin [Gentamicin] Anaphylaxis      Outpatient Encounter Medications as of 08/30/2022  Medication Sig   aspirin EC 81 MG  tablet Take 1 tablet (81 mg total) by mouth daily. Swallow whole.   clonazePAM (KLONOPIN) 1 MG tablet Take 1 tablet (1 mg total) by mouth 3 (three) times daily as needed for anxiety.   estradiol (ESTRACE) 1 MG tablet Take 1 tablet by mouth daily.   levothyroxine (SYNTHROID) 25 MCG tablet Take 1 tablet (25 mcg total) by mouth daily.   mirtazapine (REMERON) 15 MG tablet Take 15 mg by mouth at bedtime.   naloxone (NARCAN) nasal spray 4 mg/0.1 mL    ondansetron (ZOFRAN-ODT) 8 MG disintegrating tablet Take 1 tablet by mouth 3 (three) times daily. Take 1 tab by mouth tid   Oxycodone HCl 10 MG TABS Take 10 mg by mouth every 4 (four) hours as needed (pain).   progesterone (PROMETRIUM) 200 MG capsule Take 200 mg by mouth daily.   No facility-administered encounter medications on file as of 08/30/2022.     REVIEW OF SYSTEMS  : All other systems reviewed and negative except where noted in the History of Present Illness.   PHYSICAL EXAM: BP 120/80   Pulse 91   Ht 5' (1.524 m)   Wt 118 lb (53.5 kg)   BMI 23.05 kg/m  General: Well developed white female in no acute distress Head: Normocephalic and atraumatic Eyes:  Sclerae anicteric, conjunctiva pink. Ears: Normal auditory acuity Lungs: Clear throughout to auscultation; no W/R/R. Heart: Regular rate and rhythm; no M/R/G. Abdomen: Soft, non-distended.  BS present.  Non-tender. Musculoskeletal: Symmetrical with no gross deformities  Skin: No lesions on visible extremities Extremities: No edema  Neurological: Alert oriented x 4, grossly non-focal Psychological:  Alert and cooperative. Normal mood and affect  ASSESSMENT AND PLAN: *Chronic constipation: Likely at least in part opioid-induced.  Currently on Amitiza 8 mcg twice daily with no improvement.  I am going to have her increase her dose and take 3 pills in the morning and 3 pills in the evening to equal 24 mcg dosing.  If that helps and we can send prescription to her  pharmacy. *Gastroparesis with early satiety and nausea/vomiting: Had a positive gastric emptying scan in 2015.  Narcotics/opioids likely at least contributing to this as well.  Currently using Zofran.  Had seen Dr. Roney Mans back in 2018.  ? If she has ever been on Reglan. *Weight loss/general unwell feeling: Her weight does seem to fluctuate.  Some years ago she was down to 97 pounds, but back in 2020 she was up to 131 pounds.  She says that she was 107 pounds on her scale at home this morning.  Will plan for CT abdomen and pelvis.  Will try to get chest approved as well, but if that will not go through we hopefully can only start with abdomen and pelvis.  **Will follow-up with Dr. Havery Moros.   CC:  Vivi Barrack, MD

## 2022-08-30 NOTE — Progress Notes (Signed)
Agree with assessment / plan as outlined.  

## 2022-09-05 ENCOUNTER — Telehealth: Payer: Self-pay | Admitting: Gastroenterology

## 2022-09-05 NOTE — Telephone Encounter (Signed)
Patient reports she has been scheduled for the CT scan.  She states "all taken care of"

## 2022-09-05 NOTE — Telephone Encounter (Signed)
Inbound call from patient f/u on on untrasound. Please advise.  Thank you

## 2022-09-11 DIAGNOSIS — M1990 Unspecified osteoarthritis, unspecified site: Secondary | ICD-10-CM

## 2022-09-11 HISTORY — DX: Unspecified osteoarthritis, unspecified site: M19.90

## 2022-09-26 ENCOUNTER — Ambulatory Visit (HOSPITAL_COMMUNITY)
Admission: RE | Admit: 2022-09-26 | Discharge: 2022-09-26 | Disposition: A | Discharge status: Home / Self Care | Payer: Medicare Other | Source: Ambulatory Visit | Attending: Gastroenterology | Admitting: Gastroenterology

## 2022-09-26 DIAGNOSIS — R6881 Early satiety: Secondary | ICD-10-CM | POA: Insufficient documentation

## 2022-09-26 DIAGNOSIS — R634 Abnormal weight loss: Secondary | ICD-10-CM | POA: Diagnosis present

## 2022-09-26 DIAGNOSIS — K5909 Other constipation: Secondary | ICD-10-CM | POA: Diagnosis present

## 2022-09-26 DIAGNOSIS — Z87891 Personal history of nicotine dependence: Secondary | ICD-10-CM | POA: Insufficient documentation

## 2022-09-26 MED ORDER — IOHEXOL 9 MG/ML PO SOLN
ORAL | Status: AC
  Filled 2022-09-26: qty 1000

## 2022-09-26 MED ORDER — IOHEXOL 9 MG/ML PO SOLN
500.0000 mL | ORAL | Status: AC
  Administered 2022-09-26 (×2): 500 mL via ORAL

## 2022-09-26 MED ORDER — IOHEXOL 9 MG/ML PO SOLN: 500.0000 mL | ORAL | Status: AC

## 2022-09-26 MED ORDER — IOHEXOL 300 MG/ML  SOLN
100.0000 mL | Freq: Once | INTRAMUSCULAR | Status: AC | PRN
  Administered 2022-09-26: 100 mL via INTRAVENOUS

## 2022-09-28 ENCOUNTER — Telehealth: Payer: Self-pay | Admitting: Gastroenterology

## 2022-09-28 NOTE — Telephone Encounter (Signed)
Patient is calling states she is very anxious about her CT results and is looking for someone to go over them with her. Please advise

## 2022-09-28 NOTE — Telephone Encounter (Signed)
The pt has been advised that Shanda Bumps has not reviewed the CT results as of today and we will call her as soon as able.  Pt aware Shanda Bumps is at the hospital working.   Shanda Bumps see note pt is anxious for results.

## 2022-09-29 ENCOUNTER — Other Ambulatory Visit: Payer: Self-pay

## 2022-09-29 MED ORDER — LUBIPROSTONE 24 MCG PO CAPS: 24.0000 ug | ORAL_CAPSULE | Freq: Two times a day (BID) | ORAL | 3 refills | Status: DC

## 2022-10-11 ENCOUNTER — Ambulatory Visit: Payer: Medicare Other | Admitting: Physician Assistant

## 2022-11-07 ENCOUNTER — Ambulatory Visit: Payer: Medicare Other | Admitting: Gastroenterology

## 2022-11-27 ENCOUNTER — Ambulatory Visit: Payer: Medicare Other | Admitting: Gastroenterology

## 2022-12-28 ENCOUNTER — Ambulatory Visit: Payer: Medicare Other | Admitting: Physician Assistant

## 2022-12-28 NOTE — Progress Notes (Signed)
HPI :  67 y/o female here for a follow up visit to address symptoms of gastroparesis / nausea / vomiting, chronic constipation, history of colon polyps, abnormal blood work.   She was last seen March 2024 by Doug Sou when she established her care with [place], this is my first time meeting her and her husband.  Recall she was seen by Dr. Matthias Hughs in 2015 and then was referred to Dr. Merri Brunette for gastroparesis in 2018.  Since Dr. Matthias Hughs retired then she followed with Dr. Marca Ancona and saw her for colonoscopy in 2023.  She has a history of gastroparesis with a positive gastric emptying scan back in 2015.  She also reports issues with chronic constipation.  She is on chronic pain medication, oxycodone or chronic back pain- she has had a history of 14 fractures of her spine in the past.   She has not been feeling well. She states her quality of life is not good due to her gastroparesis. She does not eat well. She has chronic nausea and vomits about 4 times per week. She feels full easily. She does not eat regular meals. She weighs 113 lbs today, she weighed 118lbs back in March. She has been caring for her husband who has cancer she she thinks could also be related to poor appetite.   She is on Remeron 30mg  / day. She states initially when she took this it helped quite a bit but benefit has waned off. She takes Zofran frequently for her nausea which really helps. She does take oxcodone 4 times daily. She previously has been on Reglan which did not help and she was also concerned about risk for tardive dyskinesia and did not want to take it again. She has also tried domperidone remotely and states it provided no benefit and she stopped it. She is very frustrated by her symptoms. Last EGD was in 2016.   Otherwise she has had difficulty with chronic constipaiton. If may have a BM once every 7-10 days if she does not take anything for it. She has tried Miralax in the past without much benefit. Uses enemas PRN. Shanda Bumps  started her on Amitiza, currently taking BID and that has provided some benefit to her. She is having a BM 2-3 times per week but still has thin caliber stools, some straining. She has seen some mucous in her stools and some blood at times. She had a colonoscopy in 03/2022 with Dr. Marca Ancona - poor prep noted. She did have 5 polyps removed, largest was 15mm.   Due to some of her symptoms she had a CT scan abdomen / pelvis this past April which showed no concerning findings or changes of malignancy.   She is followed closely by her PCP. She has had fluctuating blood pressures which have been quite low lately and has not felt well with that. She has had several labs drawn by her PCP in early July. No evidence of anemia, CMET is also normal. Fasting cortisol is normal. Of note a CEA level was drawn and is elevated at 11. She is concerned she has cancer.   She has not seen a cardiologist for over a year.    Prior workup: EGD 2/16 1. There was LA Class A esophagitis noted 2. The mucosa of the stomach appeared normal 3. The duodenal mucosa showed no abnormalities in the bulb and second portion of the duodenum and 3rd part duodenum [T2] 4. Retroflexion views revealed no abnormalities   CT abdomen /  pelvis 09/26/22: IMPRESSION: 1. No significant extracardiac findings. 2. Aortic Atherosclerosis (ICD10-I70.0).    Past Medical History:  Diagnosis Date   Anxiety    Cervical spondylosis without myelopathy 10/21/2015   Cervical spondylosis without myelopathy 10/21/2015   Chronic pain    back and right hip   Complication of anesthesia    Depression    DJD (degenerative joint disease)    Hashimoto's disease    Headache(784.0)    Loss of weight 08/30/2022   Nausea and vomiting 08/30/2022   Osteoarthritis 09/2022   bilateral hip   PONV (postoperative nausea and vomiting)    Raynaud disease    Raynaud's disease    Weakness of left arm 10/21/2015     Past Surgical History:  Procedure  Laterality Date   BACK SURGERY     ESOPHAGOGASTRODUODENOSCOPY  09/28/2011   Procedure: ESOPHAGOGASTRODUODENOSCOPY (EGD);  Surgeon: Vertell Novak., MD;  Location: [facility];  Service: Endoscopy;  Laterality: N/A;   HIP SURGERY     KNEE SURGERY     SPINE SURGERY     TONSILLECTOMY     Family History  Problem Relation Age of Onset   Anesthesia problems Mother    Cancer Mother        ovary/uterus & breast   Hyperlipidemia Mother    Hypertension Mother    Subarachnoid hemorrhage Mother    Hyperlipidemia Father    Hypertension Father    Brain cancer Father    Breast cancer Maternal Aunt    Ovarian cancer Maternal Grandmother    Social History   Tobacco Use   Smoking status: Former    Current packs/day: 0.00    Average packs/day: 0.5 packs/day for 20.0 years (10.0 ttl pk-yrs)    Types: Cigarettes    Start date: 06/12/1994    Quit date: 06/12/2014    Years since quitting: 8.5   Smokeless tobacco: Never  Vaping Use   Vaping status: Some Days   Devices: does not include nicotine  Substance Use Topics   Alcohol use: No    Alcohol/week: 0.0 standard drinks of alcohol   Drug use: No   Current Outpatient Medications  Medication Sig Dispense Refill   aspirin EC 81 MG tablet Take 1 tablet (81 mg total) by mouth daily. Swallow whole. 90 tablet 3   clonazePAM (KLONOPIN) 1 MG tablet Take 1 tablet (1 mg total) by mouth 3 (three) times daily as needed for anxiety. 90 tablet 5   estradiol (ESTRACE) 1 MG tablet Take 1 tablet by mouth daily.     levothyroxine (SYNTHROID) 25 MCG tablet Take 1 tablet (25 mcg total) by mouth daily. 90 tablet 0   lubiprostone (AMITIZA) 24 MCG capsule Take 1 capsule (24 mcg total) by mouth 2 (two) times daily with a meal. 60 capsule 3   mirtazapine (REMERON) 15 MG tablet Take 15 mg by mouth at bedtime.     naloxone (NARCAN) nasal spray 4 mg/0.1 mL      ondansetron (ZOFRAN-ODT) 8 MG disintegrating tablet Take 1 tablet by mouth 3 (three) times daily. Take 1 tab  by mouth tid     Oxycodone HCl 10 MG TABS Take 10 mg by mouth every 4 (four) hours as needed (pain).     progesterone (PROMETRIUM) 200 MG capsule Take 200 mg by mouth daily.     No current facility-administered medications for this visit.   Allergies  Allergen Reactions   Gentamycin [Gentamicin] Anaphylaxis     Review of Systems: All systems  reviewed and negative except where noted in HPI.   Labs reviewed as noted in HPI - paper copy only not in Epic  Physical Exam: BP 120/68   Pulse 69   Ht 5' (1.524 m)   Wt 113 lb (51.3 kg)   SpO2 97%   BMI 22.07 kg/m  Constitutional: Pleasant,well-developed, female in no acute distress. Neurological: Alert and oriented to person place and time. Psychiatric: Normal mood and affect. Behavior is normal.   ASSESSMENT: 67 y.o. female here for assessment of the following  1. Gastroparesis   2. Chronic constipation   3. Chronic narcotic use   4. Elevated carcinoembryonic antigen (CEA)   5. History of colon polyps   6. Blood pressure alteration    Multiple issues addressed today:  She has chronic gastroparesis with symptoms from that and also on chronic narcotics. We discussed how narcotics is not good for her stomach and likely making her symptoms worse. She has lost weight recently. CT scan negative. She is not eating well despite taking Remeron. Difficult situation, she reports she needs her chronic narcotics for pain control and to function, she can follow up with pain management to discuss her alternatives to narcotics, if there are any.  She has failed Reglan and Domperidone in the past, using Zofran only right now. We discussed options. In light of her constipation as below, will try her on Motegrity which may help her stomach some. Free sample provided. I am not sure if she is a candidate for endoscopic gastroparesis therapy on narcotics but can consider terttiary care center evaluation pending her course. Given worsening symptoms in  recent years, last EGD in 2016, I offered her an upper endoscopy and she wants to proceed with that pending cardiology clearance. Will add daily PPI  otherwise to see if this helps. She has Zegerid at home and will use daily.  She is overdue for colonoscopy, history of polyps, and CEA level is elevated. I reassured her CEA level itself is not diagnostic for cancer and her CT looks okay, but she needs a high quality colonoscopy using double prep to clear her colon. She wants to do that but needs cardiology to clear her for anesthesia given her hypotension and blood pressure issues. She will have that evaluated ASAP and contact me once she is ready for scheduling. Otherwise, Amitiza has helped but will give her a free sample of Motegrity to use in its place as it may help her stomach more. If this works will prescribe it. If not, may consider another regimen such as Movantik in light of her narcotic use.   She agrees with the plan as outline.    PLAN: - needs to see her cardiologist for fluctuations in blood pressure and clearance for anesthesia to proceed with EGD and colonoscopy - will try Motegrity samples - 1 tab per day - hold Amitiza while on Motegrity - can add Miralax PRN - minimize narcotic use if possible, talk with pain management - add Zegerid daily (she has at home) - schedule for EGD and colonoscopy at the [facility] once cleared by cardiology - has failed Reglan / Domperidone - consider tertiary care evaluation if symptoms persist, EGD negative. Unclear if candidate for GPOEM if on narcotics - continue Zofran and Remeron in the interim   Harlin Rain, MD [facility]

## 2022-12-29 ENCOUNTER — Encounter: Payer: Self-pay | Admitting: Gastroenterology

## 2022-12-29 ENCOUNTER — Ambulatory Visit (INDEPENDENT_AMBULATORY_CARE_PROVIDER_SITE_OTHER): Payer: Medicare Other | Admitting: Gastroenterology

## 2022-12-29 VITALS — BP 120/68 | HR 69 | Ht 60.0 in | Wt 113.0 lb

## 2022-12-29 DIAGNOSIS — R970 Elevated carcinoembryonic antigen [CEA]: Secondary | ICD-10-CM | POA: Diagnosis not present

## 2022-12-29 DIAGNOSIS — K5909 Other constipation: Secondary | ICD-10-CM

## 2022-12-29 DIAGNOSIS — K3184 Gastroparesis: Secondary | ICD-10-CM

## 2022-12-29 DIAGNOSIS — Z86010 Personal history of colonic polyps: Secondary | ICD-10-CM

## 2022-12-29 DIAGNOSIS — R6889 Other general symptoms and signs: Secondary | ICD-10-CM

## 2022-12-29 DIAGNOSIS — F1190 Opioid use, unspecified, uncomplicated: Secondary | ICD-10-CM | POA: Diagnosis not present

## 2022-12-29 MED ORDER — POLYETHYLENE GLYCOL 3350 17 G PO PACK: 17.0000 g | PACK | Freq: Every day | ORAL | Status: DC | PRN

## 2022-12-29 MED ORDER — MOTEGRITY 2 MG PO TABS: 2.0000 mg | ORAL_TABLET | Freq: Every day | ORAL | 0 refills | Status: DC

## 2022-12-29 MED ORDER — OMEPRAZOLE-SODIUM BICARBONATE 40-1100 MG PO CAPS: 1.0000 | ORAL_CAPSULE | Freq: Every day | ORAL | Status: DC

## 2022-12-29 NOTE — Patient Instructions (Signed)
Please contact Cardiology regarding fluctuations in your blood pressure and discuss clearance for Endoscopy and Colonoscopy. Please call us with an update so we can discuss scheduling you for those procedures with Dr. Adela Lank.  We have given you samples of the following medication to take: Motegrity 2mg :  Take once daily  Note:  Hold Amitiza while taking Motegrity.  Add Miralax as needed.  Thank you for entrusting me with your care and for choosing [facility], Dr. Ileene Patrick  If your blood pressure at your visit was 140/90 or greater, please contact your primary care physician to follow up on this. ______________________________________________________  If you are age 40 or older, your body mass index should be between 23-30. Your Body mass index is 22.07 kg/m. If this is out of the aforementioned range listed, please consider follow up with your Primary Care Provider.  If you are age 70 or younger, your body mass index should be between 19-25. Your Body mass index is 22.07 kg/m. If this is out of the aformentioned range listed, please consider follow up with your Primary Care Provider.  ________________________________________________________  The [facility] providers would like to encourage you to use [facility] to communicate with providers for non-urgent requests or questions.  Due to long hold times on the telephone, sending your provider a message by [facility] may be a faster and more efficient way to get a response.  Please allow 48 business hours for a response.  Please remember that this is for non-urgent requests.  _______________________________________________________  Due to recent changes in healthcare laws, you may see the results of your imaging and laboratory studies on MyChart before your provider has had a chance to review them.  We understand that in some cases there may be results that are confusing or concerning to you. Not all laboratory results come back in  the same time frame and the provider may be waiting for multiple results in order to interpret others.  Please give [place] 48 hours in order for your provider to thoroughly review all the results before contacting the office for clarification of your results.

## 2023-01-08 ENCOUNTER — Encounter: Payer: Self-pay | Admitting: Medical

## 2023-01-08 ENCOUNTER — Ambulatory Visit: Payer: Medicare Other | Attending: Medical | Admitting: Medical

## 2023-01-08 ENCOUNTER — Ambulatory Visit (INDEPENDENT_AMBULATORY_CARE_PROVIDER_SITE_OTHER): Payer: Medicare Other

## 2023-01-08 VITALS — BP 142/76 | HR 78 | Ht 60.5 in | Wt 110.0 lb

## 2023-01-08 DIAGNOSIS — Z01810 Encounter for preprocedural cardiovascular examination: Secondary | ICD-10-CM | POA: Insufficient documentation

## 2023-01-08 DIAGNOSIS — I6523 Occlusion and stenosis of bilateral carotid arteries: Secondary | ICD-10-CM | POA: Insufficient documentation

## 2023-01-08 DIAGNOSIS — I951 Orthostatic hypotension: Secondary | ICD-10-CM | POA: Insufficient documentation

## 2023-01-08 DIAGNOSIS — R55 Syncope and collapse: Secondary | ICD-10-CM | POA: Diagnosis present

## 2023-01-08 DIAGNOSIS — R072 Precordial pain: Secondary | ICD-10-CM | POA: Insufficient documentation

## 2023-01-08 NOTE — Patient Instructions (Signed)
Medication Instructions:  Your physician recommends that you continue on your current medications as directed. Please refer to the Current Medication list given to you today.  *If you need a refill on your cardiac medications before your next appointment, please call your pharmacy*   Lab Work: None ordered today   Testing/Procedures: Your physician has requested that you have an echocardiogram. Echocardiography is a painless test that uses sound waves to create images of your heart. It provides your doctor with information about the size and shape of your heart and how well your heart's chambers and valves are working.   You may receive an ultrasound enhancing agent through an IV if needed to better visualize your heart during the echo. This procedure takes approximately one hour.  There are no restrictions for this procedure.  This will take place at [address] [address], [place] [address]  Your physician has requested that you have a carotid duplex. This test is an ultrasound of the carotid arteries in your neck. It looks at blood flow through these arteries that supply the brain with blood.   Allow one hour for this exam.  There are no restrictions or special instructions.  This will take place at [address] [address], [place] [address]   Your physician has recommended that you wear a 14 days Zio monitor.   This monitor is a medical device that records the heart's electrical activity. Doctors most often use these monitors to diagnose arrhythmias. Arrhythmias are problems with the speed or rhythm of the heartbeat. The monitor is a small device applied to your chest. You can wear one while you do your normal daily activities. While wearing this monitor if you have any symptoms to push the button and record what you felt. Once you have worn this monitor for the period of time provider prescribed (Usually 14 days), you will return the  monitor device in the postage paid box. Once it is returned they will download the data collected and provide [place] with a report which the provider will then review and we will call you with those results. Important tips:  Avoid showering during the first 24 hours of wearing the monitor. Avoid excessive sweating to help maximize wear time. Do not submerge the device, no hot tubs, and no swimming pools. Keep any lotions or oils away from the patch. After 24 hours you may shower with the patch on. Take brief showers with your back facing the shower head.  Do not remove patch once it has been placed because that will interrupt data and decrease adhesive wear time. Push the button when you have any symptoms and write down what you were feeling. Once you have completed wearing your monitor, remove and place into box which has postage paid and place in your outgoing mailbox.  If for some reason you have misplaced your box then call our office and we can provide another box and/or mail it off for you.   Follow-Up: At [facility], you and your health needs are our priority.  As part of our continuing mission to provide you with exceptional heart care, we have created designated Provider Care Teams.  These Care Teams include your primary Cardiologist (physician) and Advanced Practice Providers (APPs -  Physician Assistants and Nurse Practitioners) who all work together to provide you with the care you need, when you need it.  We recommend signing up for the patient portal called "MyChart".  Sign  up information is provided on this After Visit Summary.  MyChart is used to connect with patients for Virtual Visits (Telemedicine).  Patients are able to view lab/test results, encounter notes, upcoming appointments, etc.  Non-urgent messages can be sent to your provider as well.   To learn more about what you can do with MyChart, go to [email or web address].    Your next appointment:   4-6  week(s)  Provider:   Terrilee Croak, PA-C

## 2023-01-08 NOTE — Progress Notes (Unsigned)
Cardiology Office Note:    Date:  01/08/2023   ID:  Michaela Hall, DOB 03-22-1956, MRN [id number]  PCP:  Ardith Dark, MD  [facility] Cardiologist:  Yvonne Kendall, MD  [facility] Electrophysiologist:  None   Referring MD: Ardith Dark, MD   Chief Complaint: 1 year follow-up  History of Present Illness:    Michaela Hall is a 67 y.o. female with a hx of syncope, Raynaud's phenomenon, gastroparesis, osteoarthritis, chronic Back Pain, Anxiety and Depression who Presents for Follow-Up.  Patient was previously evaluated by Dr. Lorenz Coaster in 2017 for shortness of breath after having been bedridden for 7 months, along with occasional chest discomfort.  Myoview Lexiscan was low risk without evidence of ischemia or scar.  Echo showed preserved LV function with grade 2 diastolic dysfunction.  Patient established care with Dr. Okey Dupre September 2022.  Outpatient cardiac monitoring showed sinus rhythm with an average heart rate of 80, rare PACs and PVCs, single atrial run lasting 4 beats.  No sustained arrhythmias or pauses.  Echo showed EF of 60 to 65%, no wall motion abnormalities, grade 1 diastolic dysfunction.  Carotid artery ultrasound showed bilateral 1 to 39% ICA stenosis with antegrade flow bilateral vertebral arteries and normal flow hemodynamics of the bilateral subclavian artery.  The patient was last seen 04/2021 and was feeling sick and reporting chest pain. A cardiac CT was ordered. This showed coronary calcium score of 31.9, 69th percentile for age and sex matched, mild nonobstructive CAD.   Today, the patient reports she had 2 cancer screening tests. The second one was higher (CEA). GI wants to do EGD and colonoscopy. She is needing clearance for this. The patient reports occasional drop in BP 61/40. This has occurred 6 times in the last year. Says she has passed out twice, but the most recent was over a year ago. She sees stars, feels unstable and disoriented. She feels dizzy  and lightheaded. No chest pain or palpitations. For one syncopal episode, she was out for about a minute. Afterwards she felt weak. Says BP was low. She said Dr. Talmage Nap, her endocrinologist, did labs.  Past Medical History:  Diagnosis Date   Anxiety    Cervical spondylosis without myelopathy 10/21/2015   Cervical spondylosis without myelopathy 10/21/2015   Chronic pain    back and right hip   Complication of anesthesia    Depression    DJD (degenerative joint disease)    Hashimoto's disease    Headache(784.0)    Loss of weight 08/30/2022   Nausea and vomiting 08/30/2022   Osteoarthritis 09/2022   bilateral hip   PONV (postoperative nausea and vomiting)    Raynaud disease    Raynaud's disease    Weakness of left arm 10/21/2015    Past Surgical History:  Procedure Laterality Date   BACK SURGERY     ESOPHAGOGASTRODUODENOSCOPY  09/28/2011   Procedure: ESOPHAGOGASTRODUODENOSCOPY (EGD);  Surgeon: Vertell Novak., MD;  Location: [facility];  Service: Endoscopy;  Laterality: N/A;   HIP SURGERY     KNEE SURGERY     SPINE SURGERY     TONSILLECTOMY      Current Medications: Current Meds  Medication Sig   aspirin EC 81 MG tablet Take 1 tablet (81 mg total) by mouth daily. Swallow whole.   clonazePAM (KLONOPIN) 1 MG tablet Take 1 tablet (1 mg total) by mouth 3 (three) times daily as needed for anxiety.   cyanocobalamin (VITAMIN B12) 1000 MCG/ML injection INJECT ONCE  A WEEK FOR 30 DAYS   denosumab (PROLIA) 60 MG/ML SOSY injection 60mg  Subcutaneous every 6 months   diclofenac Sodium (VOLTAREN) 1 % GEL Apply topically.   estradiol (ESTRACE) 2 MG tablet Take 2 mg by mouth daily.   levothyroxine (SYNTHROID) 25 MCG tablet Take 1 tablet (25 mcg total) by mouth daily.   mirtazapine (REMERON) 30 MG tablet Take 30 mg by mouth at bedtime.   naloxone (NARCAN) nasal spray 4 mg/0.1 mL    omeprazole-sodium bicarbonate (ZEGERID) 40-1100 MG capsule Take 1 capsule by mouth daily before breakfast.    ondansetron (ZOFRAN-ODT) 8 MG disintegrating tablet Take 1 tablet by mouth 3 (three) times daily. Take 1 tab by mouth tid   Oxycodone HCl 10 MG TABS Take 10 mg by mouth every 4 (four) hours as needed (pain).   progesterone (PROMETRIUM) 200 MG capsule Take 200 mg by mouth daily.   Prucalopride Succinate (MOTEGRITY) 2 MG TABS Take 1 tablet (2 mg total) by mouth daily.   traZODone (DESYREL) 50 MG tablet Take 50 mg by mouth at bedtime.   Vitamin D, Ergocalciferol, (DRISDOL) 1.25 MG (50000 UNIT) CAPS capsule TAKE 1 CAPSULE BY MOUTH ONE TIME PER WEEK FOR 30 DAYS   [DISCONTINUED] estradiol (ESTRACE) 1 MG tablet Take 1 tablet by mouth daily.   [DISCONTINUED] mirtazapine (REMERON) 15 MG tablet Take 15 mg by mouth at bedtime.     Allergies:   Gentamycin [gentamicin], Alendronate, and Statins   Social History   Socioeconomic History   Marital status: Married    Spouse name: Not on file   Number of children: 3   Years of education: Not on file   Highest education level: Not on file  Occupational History   Occupation: Medicare ins. Investment banker, corporate: [employer]  Tobacco Use   Smoking status: Former    Current packs/day: 0.00    Average packs/day: 0.5 packs/day for 20.0 years (10.0 ttl pk-yrs)    Types: Cigarettes    Start date: 06/12/1994    Quit date: 06/12/2014    Years since quitting: 8.5   Smokeless tobacco: Never  Vaping Use   Vaping status: Some Days   Devices: does not include nicotine  Substance and Sexual Activity   Alcohol use: No    Alcohol/week: 0.0 standard drinks of alcohol   Drug use: No   Sexual activity: Not on file  Other Topics Concern   Not on file  Social History Narrative   Lives at home w/ her husband   Right-handed   Very little caffeine use, drinks 7up   Last colon screening: 09/2011   Social Determinants of Health   Financial Resource Strain: Low Risk  (04/20/2022)   Overall Financial Resource Strain (CARDIA)    Difficulty of Paying Living  Expenses: Not hard at all  Food Insecurity: No Food Insecurity (04/20/2022)   Hunger Vital Sign    Worried About Running Out of Food in the Last Year: Never true    Ran Out of Food in the Last Year: Never true  Transportation Needs: No Transportation Needs (04/20/2022)   PRAPARE - Administrator, Civil Service (Medical): No    Lack of Transportation (Non-Medical): No  Physical Activity: Inactive (04/20/2022)   Exercise Vital Sign    Days of Exercise per Week: 0 days    Minutes of Exercise per Session: 0 min  Stress: Stress Concern Present (04/20/2022)   [employer] of Occupational Health - Occupational Stress Questionnaire  Feeling of Stress : To some extent  Social Connections: Moderately Integrated (04/20/2022)   Social Connection and Isolation Panel [NHANES]    Frequency of Communication with Friends and Family: More than three times a week    Frequency of Social Gatherings with Friends and Family: Once a week    Attends Religious Services: More than 4 times per year    Active Member of [employer] or Organizations: No    Attends Engineer, structural: Never    Marital Status: Married     Family History: The patient's family history includes Anesthesia problems in her mother; Brain cancer in her father; Breast cancer in her maternal aunt; Cancer in her mother; Hyperlipidemia in her father and mother; Hypertension in her father and mother; Ovarian cancer in her maternal grandmother; Subarachnoid hemorrhage in her mother.  ROS:   Please see the history of present illness.     All other systems reviewed and are negative.  EKGs/Labs/Other Studies Reviewed:    The following studies were reviewed today:  Cardiac CTA 04/2021 IMPRESSION: 1. Coronary calcium score of 31.9. This was 69th percentile for age and sex matched control.   2. Normal coronary origin with right dominance.   3. Calcified plaque causing mild proximal LAD stenosis.   4. CAD-RADS 2. Mild  non-obstructive CAD (25-49%). Consider non-atherosclerotic causes of chest pain. Consider preventive therapy and risk factor modification.   Electronically Signed: By: Debbe Odea M.D. On: 05/02/2021 16:46  Carotid US 03/2021  Summary:  Right Carotid: Velocities in the right ICA are consistent with a 1-39%  stenosis.                Non-hemodynamically significant plaque <50% noted in the  CCA. The                 ECA appears <50% stenosed.   Left Carotid: Velocities in the left ICA are consistent with a 1-39%  stenosis.               Non-hemodynamically significant plaque <50% noted in the  CCA. The                ECA appears <50% stenosed.   Vertebrals:  Bilateral vertebral arteries demonstrate antegrade flow.  Subclavians: Normal flow hemodynamics were seen in bilateral subclavian               arteries.   Echo 03/2021 1. Left ventricular ejection fraction, by estimation, is 60 to 65%. The  left ventricle has normal function. The left ventricle has no regional  wall motion abnormalities. Left ventricular diastolic parameters are  consistent with Grade I diastolic  dysfunction (impaired relaxation). The average left ventricular global  longitudinal strain is -20.0 %. The global longitudinal strain is normal.   2. Right ventricular systolic function is normal. The right ventricular  size is normal. Tricuspid regurgitation signal is inadequate for assessing  PA pressure.   3. The mitral valve is normal in structure. No evidence of mitral valve  regurgitation. No evidence of mitral stenosis.   4. The aortic valve was not well visualized. Aortic valve regurgitation  is not visualized. No aortic stenosis is present.   5. The inferior vena cava is normal in size with greater than 50%  respiratory variability, suggesting right atrial pressure of 3 mmHg.    Heart monitor 03/2021 The patient was monitored for 10 days, 18 hours. The predominant rhythm was sinus with an  average rate of 80 bpm (range  50-141 bpm). There were rare PACs and PVCs. A single atrial run lasting 4 beats occurred with a maximum rate of 126 bpm. No sustained arrhythmia or prolonged pause was observed. Single patient triggered event corresponds to normal sinus rhythm.   Predominantly sinus rhythm with rare PACs and PVCs as well as a single brief episode of PSVT.  No significant arrhythmia observed.  EKG:  EKG is ordered today.  The ekg ordered today demonstrates NSR 79bpm, nonspecific T wave changes  Recent Labs: 08/30/2022: BUN 20; Creatinine, Ser 0.82; Potassium 4.4; Sodium 136  Recent Lipid Panel    Component Value Date/Time   CHOL 228 (H) 04/12/2021 1418   TRIG 105 04/12/2021 1418   HDL 65 04/12/2021 1418   CHOLHDL 3.5 04/12/2021 1418   LDLCALC 145 (H) 04/12/2021 1418    Physical Exam:    VS:  BP (!) 142/76   Pulse 78   Ht 5' 0.5" (1.537 m)   Wt 110 lb (49.9 kg)   SpO2 98%   BMI 21.13 kg/m     Wt Readings from Last 3 Encounters:  01/08/23 110 lb (49.9 kg)  12/29/22 113 lb (51.3 kg)  08/30/22 118 lb (53.5 kg)     GEN:  Well nourished, well developed in no acute distress HEENT: Normal NECK: No JVD; No carotid bruits LYMPHATICS: No lymphadenopathy CARDIAC: RRR, no murmurs, rubs, gallops RESPIRATORY:  Clear to auscultation without rales, wheezing or rhonchi  ABDOMEN: Soft, non-tender, non-distended MUSCULOSKELETAL:  No edema; No deformity  SKIN: Warm and dry NEUROLOGIC:  Alert and oriented x 3 PSYCHIATRIC:  Normal affect   ASSESSMENT:    1. Pre-syncope   2. Syncope, unspecified syncope type   3. Orthostatic hypotension   4. Bilateral carotid artery stenosis   5. Precordial pain   6. Pre-operative cardiovascular examination    PLAN:    In order of problems listed above:  Pre-syncope H/o Syncope Hypotension Patient reports presyncopal episodes and hypotension.  She feels weak and notes blood pressures 60s/40s .Prior work-up in 2022 for syncope was  overall unremarkable. Orthostatics today are positive. She is not on BP medications at baseline. I recommended liberalizing salt intake, thigh-high stockings and abdominal binders. I will re-check an echocardiogram, heart monitor, carotid US. I will request labs from endocrinologist.   Carotid artery disease Ultrasound of the carotids 2022 showed 1 to 39% stenosis bilaterally. I will update a carotid US as above.  H/o Chest pain Cardiac CTA in 2022 showed coronary calcium score of 31.9, 69th percentile for age and sex matched control, mild nonobstructive CAD. Continue Aspirin 81mg  daily  Pre-op cardiac evaluation Plannig on EGD and colonoscopy for urgent work-up for elevated CEA levels. She is undergoing pre-syncope/hypotension work-up, but unsure this will greatly change cardiac management, especially there have been no recent syncopal episode. BP today is normal, however orthostatics are positive. I recommended lifestyle changes in regards to this. She denies chest pain. Cardiac CTA in 2022 showed mild-nonobstructive CAD, which is overall reassuring. METS<4 due to chronic generalized body pain. According the RCRI she is Class I risk, 3.9% 30 day risk of death, MI or cardiac arrest. I will discuss urgent with MD situation, suspect OK to pursue EGD/colonoscopy without cardiac work-up results. She is not on a statin.  Regarding ASA therapy, we recommend continuation of ASA throughout the perioperative period.  However, if the surgeon feels that cessation of ASA is required in the perioperative period, it may be stopped 5-7 days prior to surgery with a  plan to resume it as soon as felt to be feasible from a surgical standpoint in the post-operative period.   Disposition: Follow up in 4-6 week(s) with MD/APP    Signed, Desira Alessandrini David Stall, PA-C  01/08/2023 4:58 PM    [facility]

## 2023-01-09 ENCOUNTER — Telehealth: Payer: Self-pay

## 2023-01-09 NOTE — Telephone Encounter (Signed)
Left a message for the patient to call back.    Fransico Michael, Cadence H, PA-C  Parke Poisson, RN; P Cv Div Preop Per MD, OK for patient to undergo EGD/colonoscopy  Prior Cardiac CTA showed nonobstructive CAD and prior echo, heart monitor, US carotids benign.       Previous Messages    ----- Message ----- From: Iran Ouch, MD Sent: 01/09/2023   4:32 PM EDT To: Yvonne Kendall, MD; Cadence David Stall, PA-C  She should be fine to proceed with procedures without cardiac workup as her EKG is unremarkable and previous cardiac CTA and echocardiogram were also good. ----- Message ----- From: Marianne Sofia, PA-C Sent: 01/09/2023   4:20 PM EDT To: Yvonne Kendall, MD; Iran Ouch, MD  Pt is needing urgent EGD/colonoscopy for elevated CEA. She is undergoing hypotension/pre-syncope work-up. Prior work-up in 2022 for syncope was unremarkable. She is orthostatic. Just want to see if it's Ok to pursue procedures without cardiac results.

## 2023-01-10 NOTE — Telephone Encounter (Signed)
Left a message for the patient to call back.  

## 2023-01-11 DIAGNOSIS — R55 Syncope and collapse: Secondary | ICD-10-CM | POA: Diagnosis not present

## 2023-01-12 NOTE — Telephone Encounter (Signed)
Attempted to contact pt x 3. Left message to call back 

## 2023-02-09 ENCOUNTER — Ambulatory Visit: Payer: Medicare Other

## 2023-02-09 DIAGNOSIS — R55 Syncope and collapse: Secondary | ICD-10-CM | POA: Insufficient documentation

## 2023-02-10 LAB — ECHOCARDIOGRAM COMPLETE
AR max vel: 2.36 cm2
AV Area VTI: 2.37 cm2
AV Area mean vel: 2.2 cm2
AV Mean grad: 3 mmHg
AV Peak grad: 4.4 mmHg
Ao pk vel: 1.05 m/s
Area-P 1/2: 4.21 cm2
Calc EF: 52.1 %
S' Lateral: 2.7 cm
Single Plane A2C EF: 54 %
Single Plane A4C EF: 50.6 %

## 2023-02-28 ENCOUNTER — Other Ambulatory Visit: Payer: Medicare Other

## 2023-02-28 ENCOUNTER — Encounter: Payer: Self-pay | Admitting: Gastroenterology

## 2023-02-28 ENCOUNTER — Ambulatory Visit (INDEPENDENT_AMBULATORY_CARE_PROVIDER_SITE_OTHER): Payer: Medicare Other | Admitting: Gastroenterology

## 2023-02-28 VITALS — BP 140/90 | HR 76 | Ht 61.0 in | Wt 114.0 lb

## 2023-02-28 DIAGNOSIS — F1190 Opioid use, unspecified, uncomplicated: Secondary | ICD-10-CM

## 2023-02-28 DIAGNOSIS — K5909 Other constipation: Secondary | ICD-10-CM

## 2023-02-28 DIAGNOSIS — Z86010 Personal history of colonic polyps: Secondary | ICD-10-CM

## 2023-02-28 DIAGNOSIS — K3184 Gastroparesis: Secondary | ICD-10-CM

## 2023-02-28 DIAGNOSIS — R970 Elevated carcinoembryonic antigen [CEA]: Secondary | ICD-10-CM

## 2023-02-28 MED ORDER — MOTEGRITY 2 MG PO TABS: 2.0000 mg | ORAL_TABLET | Freq: Every day | ORAL | 3 refills | Status: DC

## 2023-02-28 NOTE — Progress Notes (Signed)
HPI :  67 year old female here for follow-up visit to discuss symptoms of gastroparesis, chronic nausea vomiting, chronic constipation, history of colon polyps and elevated CEA.  I last saw her in July.   Recall she was seen by Dr. Matthias Hughs in 2015 and then was referred to Dr. Merri Brunette for gastroparesis in 2018.  Since Dr. Matthias Hughs retired then she followed with Dr. Marca Ancona and saw her for colonoscopy in 2023.  She has a history of gastroparesis with a positive gastric emptying scan back in 2015.  She also reports issues with chronic constipation.  She is on chronic pain medication, oxycodone or chronic back pain- she has had a history of 14 fractures of her spine in the past.    Recall that she has had chronic nausea and vomits multiple times per week, with early satiety.  Her weight has been stable around 113-1 14 since have last seen her, she weighed about 118 back in March.  Recall that she has been tried on Reglan in the past which did not help and she was concerned about side effects from it and did not want to try it again.  She was also on domperidone remotely for her symptoms which did not help.  She has been placed on Remeron 30 mg daily.  She states initially this did provide significant benefit but that has since weaned, she is hesitant to increase the dose further.  Her last EGD was in 2016.  We had discussed doing potential upper endoscopy and colonoscopy at her last visit but this was not done yet because she needed to see cardiology in light of some fluctuating blood pressures and workup that was needed.  Recall she is also had chronic constipation for years.  She has very infrequent stools if she does not take anything for this.  She has failed Amitiza.  All of her colonoscopies in the past a bit limited by poor preps. She had a colonoscopy in 03/2022 with Dr. Marca Ancona - poor prep noted. She did have 5 polyps removed, largest was 15mm.    Due to some of her symptoms she had a CT scan abdomen /  pelvis this past April which showed no concerning findings or changes of malignancy.    At the time of her last visit we got her a sample of Motegrity which she was able to take and she states this was a "miracle".  She states this has worked better than anything else she has tried in the past and is having a bowel movement daily while taking it.  She would like a prescription for this.  She does not think this helped much of her symptoms of gastroparesis however.  She continues to take Zofran daily.  She continues with chronic nausea.  She takes about 5 oxycodone per day.  She is followed closely by her PCP. She has had fluctuating blood pressures, she was seen by cardiology since her last visit and had an echocardiogram which looked okay as well as a Holter monitor.  Fasting cortisol normal.  She was cleared by cardiology to proceed with her exams.  Of note she has had a CEA level drawn twice by her primary care which was elevated at 11, she remains concerned she has an underlying malignancy.  She wants to do EGD and colonoscopy as soon as she can   Prior workup: EGD 2/16 1. There was LA Class A esophagitis noted 2. The mucosa of the stomach appeared normal 3. The duodenal mucosa  showed no abnormalities in the bulb and second portion of the duodenum and 3rd part duodenum [T2] 4. Retroflexion views revealed no abnormalities    CT abdomen / pelvis 09/26/22: IMPRESSION: 1. No significant extracardiac findings. 2. Aortic Atherosclerosis (ICD10-I70.0).    Echo 02/09/2023: EF 60-65% grade I DD  Heart monitor 01/23/23: The patient was monitored for 2 days.   The predominant rhythm was sinus with an average rate of 80 bpm (range 55-119 bpm in sinus).   There were rare PAC's and PVC's.   Two supraventricular runs were observed, lasting up to 5 beats with a maximum rate of 152 bpm.   No sustained arrhythmia or prolonged pause was seen.   There were no patient triggered events.   Predominantly  sinus rhythm with rare PAC's and PVC's as well as two brief supraventricular runs.  Please note, monitoring period was short (2 days).  Carotid US 02/09/23: Summary:  Right Carotid: Velocities in the right ICA are consistent with a 1-39%  stenosis.   Left Carotid: Velocities in the left ICA are consistent with a 1-39%  stenosis.   Vertebrals: Bilateral vertebral arteries demonstrate antegrade flow.  Subclavians: Normal flow hemodynamics were seen in bilateral subclavian               arteries.     Past Medical History:  Diagnosis Date   Anxiety    Cervical spondylosis without myelopathy 10/21/2015   Cervical spondylosis without myelopathy 10/21/2015   Chronic pain    back and right hip   Complication of anesthesia    Depression    DJD (degenerative joint disease)    Hashimoto's disease    Headache(784.0)    Loss of weight 08/30/2022   Nausea and vomiting 08/30/2022   Osteoarthritis 09/2022   bilateral hip   PONV (postoperative nausea and vomiting)    Raynaud disease    Raynaud's disease    Weakness of left arm 10/21/2015     Past Surgical History:  Procedure Laterality Date   BACK SURGERY     ESOPHAGOGASTRODUODENOSCOPY  09/28/2011   Procedure: ESOPHAGOGASTRODUODENOSCOPY (EGD);  Surgeon: Vertell Novak., MD;  Location: [facility];  Service: Endoscopy;  Laterality: N/A;   HIP SURGERY     KNEE SURGERY     SPINE SURGERY     TONSILLECTOMY     Family History  Problem Relation Age of Onset   Anesthesia problems Mother    Cancer Mother        ovary/uterus & breast   Hyperlipidemia Mother    Hypertension Mother    Subarachnoid hemorrhage Mother    Hyperlipidemia Father    Hypertension Father    Brain cancer Father    Breast cancer Maternal Aunt    Ovarian cancer Maternal Grandmother    Social History   Tobacco Use   Smoking status: Former    Current packs/day: 0.00    Average packs/day: 0.5 packs/day for 20.0 years (10.0 ttl pk-yrs)    Types: Cigarettes     Start date: 06/12/1994    Quit date: 06/12/2014    Years since quitting: 8.7   Smokeless tobacco: Never  Vaping Use   Vaping status: Some Days   Devices: does not include nicotine  Substance Use Topics   Alcohol use: No    Alcohol/week: 0.0 standard drinks of alcohol   Drug use: No   Current Outpatient Medications  Medication Sig Dispense Refill   aspirin EC 81 MG tablet Take 1 tablet (81 mg total)  by mouth daily. Swallow whole. 90 tablet 3   clonazePAM (KLONOPIN) 1 MG tablet Take 1 tablet (1 mg total) by mouth 3 (three) times daily as needed for anxiety. 90 tablet 5   cyanocobalamin (VITAMIN B12) 1000 MCG/ML injection INJECT ONCE A WEEK FOR 30 DAYS     denosumab (PROLIA) 60 MG/ML SOSY injection 60mg  Subcutaneous every 6 months     diclofenac Sodium (VOLTAREN) 1 % GEL Apply topically.     estradiol (ESTRACE) 2 MG tablet Take 2 mg by mouth daily.     levothyroxine (SYNTHROID) 25 MCG tablet Take 1 tablet (25 mcg total) by mouth daily. 90 tablet 0   mirtazapine (REMERON) 30 MG tablet Take 30 mg by mouth at bedtime.     naloxone (NARCAN) nasal spray 4 mg/0.1 mL      omeprazole-sodium bicarbonate (ZEGERID) 40-1100 MG capsule Take 1 capsule by mouth daily before breakfast.     ondansetron (ZOFRAN-ODT) 8 MG disintegrating tablet Take 1 tablet by mouth 3 (three) times daily. Take 1 tab by mouth tid     Oxycodone HCl 10 MG TABS Take 10 mg by mouth every 4 (four) hours as needed (pain).     progesterone (PROMETRIUM) 200 MG capsule Take 200 mg by mouth daily.     Prucalopride Succinate (MOTEGRITY) 2 MG TABS Take 1 tablet (2 mg total) by mouth daily. 14 tablet 0   REPATHA 140 MG/ML SOSY Inject into the skin.     traZODone (DESYREL) 50 MG tablet Take 50 mg by mouth at bedtime.     Vitamin D, Ergocalciferol, (DRISDOL) 1.25 MG (50000 UNIT) CAPS capsule TAKE 1 CAPSULE BY MOUTH ONE TIME PER WEEK FOR 30 DAYS     No current facility-administered medications for this visit.   Allergies  Allergen  Reactions   Gentamycin [Gentamicin] Anaphylaxis   Alendronate     Other Reaction(s): Pain all over body   Statins     Other Reaction(s): myalgias     Review of Systems: All systems reviewed and negative except where noted in HPI.    VAS US CAROTID  Result Date: 02/13/2023 Carotid Arterial Duplex Study Patient Name:  GEORGANN ESCARSEGA  Date of Exam:   02/09/2023 Medical Rec #: [id number]         Accession #:    [id number] Date of Birth: 09-07-1955          Patient Gender: F Patient Age:   45 years Exam Location:  [place] Procedure:      VAS US CAROTID Referring Phys: Tommie Ard FURTH --------------------------------------------------------------------------------  Indications:       Syncope and 2 year follow-up. Risk Factors:      Past history of smoking. Comparison Study:  04/08/21 carotid duplex exam showed RICA velocities of 75/30                    cm/s and LICA velocities of 71/25 cm/s Performing Technologist: Quentin Ore RDMS, RVT, RDCS  Examination Guidelines: A complete evaluation includes B-mode imaging, spectral Doppler, color Doppler, and power Doppler as needed of all accessible portions of each vessel. Bilateral testing is considered an integral part of a complete examination. Limited examinations for reoccurring indications may be performed as noted.  Right Carotid Findings: +----------+--------+--------+--------+----------------------+--------+           PSV cm/sEDV cm/sStenosisPlaque Description    Comments +----------+--------+--------+--------+----------------------+--------+ CCA Prox  80      19                                             +----------+--------+--------+--------+----------------------+--------+  CCA Distal81      19              smooth and hyperechoic         +----------+--------+--------+--------+----------------------+--------+ ICA Prox  71      23      1-39%   hyperechoic and smooth          +----------+--------+--------+--------+----------------------+--------+ ICA Mid   69      23                                             +----------+--------+--------+--------+----------------------+--------+ ICA Distal66      25                                             +----------+--------+--------+--------+----------------------+--------+ ECA       86      16                                             +----------+--------+--------+--------+----------------------+--------+ +----------+--------+-------+----------------+-------------------+           PSV cm/sEDV cmsDescribe        Arm Pressure (mmHG) +----------+--------+-------+----------------+-------------------+ [id number]            Multiphasic, [id number]                 +----------+--------+-------+----------------+-------------------+ +---------+--------+--+--------+--+---------+ VertebralPSV cm/s49EDV cm/s18Antegrade +---------+--------+--+--------+--+---------+  Left Carotid Findings: +----------+--------+--------+--------+----------------------+--------+           PSV cm/sEDV cm/sStenosisPlaque Description    Comments +----------+--------+--------+--------+----------------------+--------+ CCA Prox  76      22                                             +----------+--------+--------+--------+----------------------+--------+ CCA Distal87      26              hyperechoic and smooth         +----------+--------+--------+--------+----------------------+--------+ ICA Prox  65      21      1-39%   hyperechoic and smooth         +----------+--------+--------+--------+----------------------+--------+ ICA Mid   89      25                                             +----------+--------+--------+--------+----------------------+--------+ ICA Distal68      32                                             +----------+--------+--------+--------+----------------------+--------+ ECA        73      18                                             +----------+--------+--------+--------+----------------------+--------+ +----------+--------+--------+----------------+-------------------+  PSV cm/sEDV cm/sDescribe        Arm Pressure (mmHG) +----------+--------+--------+----------------+-------------------+ [id number]              Multiphasic, [id number]                 +----------+--------+--------+----------------+-------------------+ +---------+--------+--+--------+--+---------+ VertebralPSV cm/s39EDV cm/s17Antegrade +---------+--------+--+--------+--+---------+   Summary: Right Carotid: Velocities in the right ICA are consistent with a 1-39% stenosis. Left Carotid: Velocities in the left ICA are consistent with a 1-39% stenosis. Vertebrals:  Bilateral vertebral arteries demonstrate antegrade flow. Subclavians: Normal flow hemodynamics were seen in bilateral subclavian              arteries. *See table(s) above for measurements and observations.  Electronically signed by Lorine Bears MD on 02/13/2023 at 12:55:44 PM.    Final    ECHOCARDIOGRAM COMPLETE  Result Date: 02/10/2023    ECHOCARDIOGRAM REPORT   Patient Name:   WAKELY GEHRKE Date of Exam: 02/09/2023 Medical Rec #:  [id number]        Height:       60.5 in Accession #:    [id number]       Weight:       110.0 lb Date of Birth:  1956/03/22         BSA:          1.457 m Patient Age:    67 years         BP:           142/76 mmHg Patient Gender: F                HR:           78 bpm. Exam Location:  [place] Procedure: 2D Echo, Cardiac Doppler and Color Doppler Indications:    R55 Syncope  History:        Patient has prior history of Echocardiogram examinations, most                 recent 04/08/2021. Signs/Symptoms:Syncope; Risk Factors:Former                 Smoker.  Sonographer:    Quentin Ore RDMS, RVT, RDCS Referring Phys: [id number] CADENCE H FURTH IMPRESSIONS  1. Left ventricular ejection fraction, by estimation,  is 60 to 65%. Left ventricular ejection fraction by PLAX is 56 %. The left ventricle has normal function. The left ventricle has no regional wall motion abnormalities. Left ventricular diastolic parameters are consistent with Grade I diastolic dysfunction (impaired relaxation).  2. Right ventricular systolic function is normal. The right ventricular size is normal.  3. The mitral valve is normal in structure. No evidence of mitral valve regurgitation. No evidence of mitral stenosis.  4. The aortic valve has an indeterminant number of cusps. Aortic valve regurgitation is not visualized. No aortic stenosis is present.  5. The inferior vena cava is normal in size with greater than 50% respiratory variability, suggesting right atrial pressure of 3 mmHg. FINDINGS  Left Ventricle: Left ventricular ejection fraction, by estimation, is 60 to 65%. Left ventricular ejection fraction by PLAX is 56 %. The left ventricle has normal function. The left ventricle has no regional wall motion abnormalities. The left ventricular internal cavity size was normal in size. There is no left ventricular hypertrophy. Left ventricular diastolic parameters are consistent with Grade I diastolic dysfunction (impaired relaxation). Right Ventricle: The right ventricular size is normal. No increase in right ventricular wall thickness. Right ventricular systolic function is normal. Left Atrium: Left atrial size  was normal in size. Right Atrium: Right atrial size was normal in size. Pericardium: There is no evidence of pericardial effusion. Mitral Valve: The mitral valve is normal in structure. No evidence of mitral valve regurgitation. No evidence of mitral valve stenosis. Tricuspid Valve: The tricuspid valve is normal in structure. Tricuspid valve regurgitation is not demonstrated. No evidence of tricuspid stenosis. Aortic Valve: The aortic valve has an indeterminant number of cusps. Aortic valve regurgitation is not visualized. No aortic stenosis  is present. Aortic valve mean gradient measures 3.0 mmHg. Aortic valve peak gradient measures 4.4 mmHg. Aortic valve area, by VTI measures 2.37 cm. Pulmonic Valve: The pulmonic valve was normal in structure. Pulmonic valve regurgitation is not visualized. No evidence of pulmonic stenosis. Aorta: The aortic root is normal in size and structure. Venous: The inferior vena cava is normal in size with greater than 50% respiratory variability, suggesting right atrial pressure of 3 mmHg. IAS/Shunts: No atrial level shunt detected by color flow Doppler.  LEFT VENTRICLE PLAX 2D LV EF:         Left            Diastology                ventricular     LV e' medial:    6.42 cm/s                ejection        LV E/e' medial:  11.0                fraction by     LV e' lateral:   9.14 cm/s                PLAX is 56      LV E/e' lateral: 7.7                %. LVIDd:         3.80 cm LVIDs:         2.70 cm LV PW:         0.90 cm LV IVS:        1.00 cm LVOT diam:     1.80 cm LV SV:         47 LV SV Index:   32 LVOT Area:     2.54 cm  LV Volumes (MOD) LV vol d, MOD    43.9 ml A2C: LV vol d, MOD    43.9 ml A4C: LV vol s, MOD    20.2 ml A2C: LV vol s, MOD    21.7 ml A4C: LV SV MOD A2C:   23.7 ml LV SV MOD A4C:   43.9 ml LV SV MOD BP:    23.1 ml RIGHT VENTRICLE             IVC RV Basal diam:  2.70 cm     IVC diam: 1.70 cm RV S prime:     10.60 cm/s TAPSE (M-mode): 2.0 cm LEFT ATRIUM             Index        RIGHT ATRIUM           Index LA diam:        2.80 cm 1.92 cm/m   RA Area:     10.20 cm LA Vol (A2C):   26.6 ml 18.26 ml/m  RA Volume:   21.60 ml  14.83 ml/m LA Vol (A4C):   24.3 ml 16.68 ml/m LA  Biplane Vol: 25.8 ml 17.71 ml/m  AORTIC VALVE                    PULMONIC VALVE AV Area (Vmax):    2.36 cm     PV Vmax:       0.90 m/s AV Area (Vmean):   2.20 cm     PV Peak grad:  3.2 mmHg AV Area (VTI):     2.37 cm AV Vmax:           105.00 cm/s AV Vmean:          73.300 cm/s AV VTI:            0.200 m AV Peak Grad:      4.4 mmHg  AV Mean Grad:      3.0 mmHg LVOT Vmax:         97.50 cm/s LVOT Vmean:        63.400 cm/s LVOT VTI:          0.186 m LVOT/AV VTI ratio: 0.93  AORTA Ao Root diam: 2.90 cm Ao Arch diam: 2.8 cm MITRAL VALVE MV Area (PHT): 4.21 cm    SHUNTS MV Decel Time: 180 msec    Systemic VTI:  0.19 m MV E velocity: 70.70 cm/s  Systemic Diam: 1.80 cm MV A velocity: 71.30 cm/s MV E/A ratio:  0.99 Julien Nordmann MD Electronically signed by Julien Nordmann MD Signature Date/Time: 02/10/2023/10:36:22 AM    Final     Physical Exam: BP (!) 148/84   Pulse 76   Ht 5\' 1"  (1.549 m)   Wt 114 lb (51.7 kg)   BMI 21.54 kg/m  Constitutional: Pleasant,well-developed, female in no acute distress. Neurological: Alert and oriented to person place and time. Psychiatric: Normal mood and affect. Behavior is normal.   ASSESSMENT: 67 y.o. female here for assessment of the following  1. Gastroparesis   2. Chronic constipation   3. Chronic narcotic use   4. Elevated carcinoembryonic antigen (CEA)   5. History of colon polyps    As above, ongoing refractory gastroparesis symptoms in the setting of chronic narcotics.  Unfortunately she requires the narcotics to function due to severe pain, this is made her gastroparesis symptoms much more difficult to treat.  Reglan and domperidone did not help, now on Zofran frequently to help reduce her nausea.  Very difficult situation.  Have discussed doing an EGD with her to really assess her stomach given her last endoscopy was sometime ago and in light of her elevated CEA level.  Scheduling this as below.  If negative for an organic cause of symptoms then consider referral to tertiary care center such as [facility], however she may not be a candidate for interventional therapy for gastroparesis in the setting of narcotic use.  Otherwise, prior colonoscopies have been limited by poor prep due to her severe constipation.  She has had an elevated CEA level and we will recheck that to see if that is  persistent.  She has been cleared by cardiology since our last visit and now able to proceed with EGD and colonoscopy in the [facility].  Fortunately Motegrity has really provided good benefit for her constipation and recommend she continue this daily, will place a prescription and additional free sample given to her.  We will give her a 2-day bowel preparation in addition to Motegrity plus higher dose MiraLAX in the week preceding her preparation to get ready for this exam.  Need a high-quality colonoscopy to  ensure no evidence of colon cancer.  We discussed that CEA is not a generalized screening test for colon cancer and that it does not mean she has colon cancer, however given limited preps on her last exam we must perform high-quality exam with a good prep to exclude this.  Following discussion of EGD and colonoscopy, risks benefits of the procedure and anesthesia, she wants to proceed.  She will need to take Zofran around the time of her bowel prep to hopefully tolerate it.  PLAN: - schedule EGD and colonoscopy the [facility] - first available, 10/15 - continue Motegrity 2mg  - once daily, working well, prescription placed - week prior to the procedure, add Miralax 2 to 3 times per day - liquid diet for 48 hours prior to the exam - double prep for her colonoscopy- full Miralax prep + standard - and then additional Miralax prep as needed if not clear after second portion of the prep - lab for CEA level today to trend - continue Zofran - if EGD negative may need tertiary care center evaluation at [facility] for gastroparesis - long term recommend coming off narcotics if she can control pain with other modalities of pain control  Harlin Rain, MD [facility]

## 2023-02-28 NOTE — Patient Instructions (Addendum)
_______________________________________________________  If your blood pressure at your visit was 140/90 or greater, please contact your primary care physician to follow up on this.  _______________________________________________________  If you are age 67 or older, your body mass index should be between 23-30. Your There is no height or weight on file to calculate BMI. If this is out of the aforementioned range listed, please consider follow up with your Primary Care Provider.  If you are age 62 or younger, your body mass index should be between 19-25. Your There is no height or weight on file to calculate BMI. If this is out of the aformentioned range listed, please consider follow up with your Primary Care Provider.   ________________________________________________________  The [place] [facility] providers would like to encourage you to use [facility] to communicate with providers for non-urgent requests or questions.  Due to long hold times on the telephone, sending your provider a message by [facility] may be a faster and more efficient way to get a response.  Please allow 48 business hours for a response.  Please remember that this is for non-urgent requests.  _______________________________________________________  Your provider has requested that you go to the basement level for lab work before leaving today. Press "B" on the elevator. The lab is located at the first door on the left as you exit the elevator.  You have been scheduled for an endoscopy and colonoscopy. Please follow the written instructions given to you at your visit today.  Please pick up your prep supplies at the pharmacy within the next 1-3 days.  If you use inhalers (even only as needed), please bring them with you on the day of your procedure.  DO NOT TAKE 7 DAYS PRIOR TO TEST- Trulicity (dulaglutide) Ozempic, Wegovy (semaglutide) Mounjaro (tirzepatide) Bydureon Bcise (exanatide extended release)  DO NOT TAKE 1 DAY PRIOR  TO YOUR TEST Rybelsus (semaglutide) Adlyxin (lixisenatide) Victoza (liraglutide) Byetta (exanatide) ___________________________________________________________________________  Due to recent changes in healthcare laws, you may see the results of your imaging and laboratory studies on MyChart before your provider has had a chance to review them.  We understand that in some cases there may be results that are confusing or concerning to you. Not all laboratory results come back in the same time frame and the provider may be waiting for multiple results in order to interpret others.  Please give [place] 48 hours in order for your provider to thoroughly review all the results before contacting the office for clarification of your results.     It was a pleasure to see you today!  Thank you for trusting me with your gastrointestinal care!

## 2023-03-01 LAB — CEA: CEA: 9.5 ng/mL — ABNORMAL HIGH

## 2023-03-02 ENCOUNTER — Encounter: Payer: Self-pay | Admitting: Medical

## 2023-03-02 ENCOUNTER — Ambulatory Visit: Payer: Medicare Other | Attending: Medical | Admitting: Medical

## 2023-03-02 VITALS — BP 108/68 | HR 84 | Ht 61.0 in | Wt 111.8 lb

## 2023-03-02 DIAGNOSIS — R072 Precordial pain: Secondary | ICD-10-CM | POA: Diagnosis present

## 2023-03-02 DIAGNOSIS — Z01810 Encounter for preprocedural cardiovascular examination: Secondary | ICD-10-CM | POA: Insufficient documentation

## 2023-03-02 DIAGNOSIS — I951 Orthostatic hypotension: Secondary | ICD-10-CM | POA: Insufficient documentation

## 2023-03-02 DIAGNOSIS — I6523 Occlusion and stenosis of bilateral carotid arteries: Secondary | ICD-10-CM | POA: Diagnosis present

## 2023-03-02 DIAGNOSIS — R55 Syncope and collapse: Secondary | ICD-10-CM | POA: Diagnosis present

## 2023-03-02 NOTE — Progress Notes (Signed)
Cardiology Office Note:    Date:  03/02/2023   ID:  Michaela Hall, DOB [birth year], MRN [id number]  PCP:  Ardith Dark, MD  [facility] Cardiologist:  Yvonne Kendall, MD  [facility] Electrophysiologist:  None   Referring MD: Ardith Dark, MD   Chief Complaint: 2 month follow-up  History of Present Illness:    Michaela Hall is a 67 y.o. female with a hx of Raynaud's phenomenon, gastroparesis, osteoarthritis, chronic Back Pain, Anxiety and Depression who presents for 2 month follow-Up.   Patient was previously evaluated by Dr. Lorenz Coaster in 2017 for shortness of breath after having been bedridden for 7 months, along with occasional chest discomfort.  Myoview Lexiscan was low risk without evidence of ischemia or scar.  Echo showed preserved LV function with grade 2 diastolic dysfunction.  Patient established care with Dr. Okey Dupre September 2022.  Outpatient cardiac monitoring showed sinus rhythm with an average heart rate of 80, rare PACs and PVCs, single atrial run lasting 4 beats.  No sustained arrhythmias or pauses.  Echo showed EF of 60 to 65%, no wall motion abnormalities, grade 1 diastolic dysfunction.  Carotid artery ultrasound showed bilateral 1 to 39% ICA stenosis with antegrade flow bilateral vertebral arteries and normal flow hemodynamics of the bilateral subclavian artery.   The patient was last seen 04/2021 and was feeling sick and reporting chest pain. A cardiac CT was ordered. This showed coronary calcium score of 31.9, 69th percentile for age and sex matched, mild nonobstructive CAD.   Patient was last seen July 2024 for GI preop for EGD and colonoscopy due to cancer screening test.  Patient reported occasional blood pressure drop/presyncope.  Orthostatics were positive, however she was not on any blood pressure medications at baseline.  Lifestyle changes were discussed.  Echocardiogram, heart monitor, carotid ultrasound were ordered.  Heart monitor showed predominantly  normal sinus rhythm, rare PACs and PVCs, 2 brief runs of SVT (heart monitor worn for 2 days).  Carotid ultrasound showed bilateral 1 to 39% stenosis.  Echo showed LVEF 60 to 65%, grade 1 diastolic dysfunction, normal RV SF.  Case was discussed with MD, who agreed urgency of procedure was greater than risk, and it was deemed OK to pursue without full cardiac work-up.  Today, the patient reports she is feeling the same. Overall, she is not feeling well, tired with generalized fatigue. Repeat CEA was elevated. She denies low BP event since the last visit. BP yesterday was mildly elevated. She denies chest pain, SOB, LLE. She is scheduled for EGD/colonoscopy on 10/18.   Past Medical History:  Diagnosis Date   Anxiety    Cervical spondylosis without myelopathy 10/21/2015   Cervical spondylosis without myelopathy 10/21/2015   Chronic pain    back and right hip   Complication of anesthesia    Depression    DJD (degenerative joint disease)    Hashimoto's disease    Headache(784.0)    Loss of weight 08/30/2022   Nausea and vomiting 08/30/2022   Osteoarthritis 09/2022   bilateral hip   PONV (postoperative nausea and vomiting)    Raynaud disease    Raynaud's disease    Weakness of left arm 10/21/2015    Past Surgical History:  Procedure Laterality Date   BACK SURGERY     ESOPHAGOGASTRODUODENOSCOPY  09/28/2011   Procedure: ESOPHAGOGASTRODUODENOSCOPY (EGD);  Surgeon: Vertell Novak., MD;  Location: [facility];  Service: Endoscopy;  Laterality: N/A;   HIP SURGERY     KNEE  SURGERY     SPINE SURGERY     TONSILLECTOMY      Current Medications: Current Meds  Medication Sig   aspirin EC 81 MG tablet Take 1 tablet (81 mg total) by mouth daily. Swallow whole.   clonazePAM (KLONOPIN) 1 MG tablet Take 1 tablet (1 mg total) by mouth 3 (three) times daily as needed for anxiety.   cyanocobalamin (VITAMIN B12) 1000 MCG/ML injection INJECT ONCE A WEEK FOR 30 DAYS   denosumab (PROLIA) 60 MG/ML  SOSY injection 60mg  Subcutaneous every 6 months   diclofenac Sodium (VOLTAREN) 1 % GEL Apply topically.   estradiol (ESTRACE) 2 MG tablet Take 2 mg by mouth daily.   levothyroxine (SYNTHROID) 25 MCG tablet Take 1 tablet (25 mcg total) by mouth daily.   mirtazapine (REMERON) 30 MG tablet Take 30 mg by mouth at bedtime.   naloxone (NARCAN) nasal spray 4 mg/0.1 mL    omeprazole-sodium bicarbonate (ZEGERID) 40-1100 MG capsule Take 1 capsule by mouth daily before breakfast.   ondansetron (ZOFRAN-ODT) 8 MG disintegrating tablet Take 1 tablet by mouth 3 (three) times daily. Take 1 tab by mouth tid   Oxycodone HCl 10 MG TABS Take 10 mg by mouth every 4 (four) hours as needed (pain).   progesterone (PROMETRIUM) 200 MG capsule Take 200 mg by mouth daily.   Prucalopride Succinate (MOTEGRITY) 2 MG TABS Take 1 tablet (2 mg total) by mouth daily.   REPATHA 140 MG/ML SOSY Inject into the skin.   traZODone (DESYREL) 50 MG tablet Take 50 mg by mouth at bedtime.   Vitamin D, Ergocalciferol, (DRISDOL) 1.25 MG (50000 UNIT) CAPS capsule TAKE 1 CAPSULE BY MOUTH ONE TIME PER WEEK FOR 30 DAYS     Allergies:   Gentamycin [gentamicin], Alendronate, and Statins   Social History   Socioeconomic History   Marital status: Married    Spouse name: Not on file   Number of children: 3   Years of education: Not on file   Highest education level: Not on file  Occupational History   Occupation: Medicare ins. Investment banker, corporate: [employer]  Tobacco Use   Smoking status: Former    Current packs/day: 0.00    Average packs/day: 0.5 packs/day for 20.0 years (10.0 ttl pk-yrs)    Types: Cigarettes    Start date: 06/12/1994    Quit date: 06/12/2014    Years since quitting: 8.7   Smokeless tobacco: Never  Vaping Use   Vaping status: Some Days   Devices: does not include nicotine  Substance and Sexual Activity   Alcohol use: No    Alcohol/week: 0.0 standard drinks of alcohol   Drug use: No   Sexual activity: Not on  file  Other Topics Concern   Not on file  Social History Narrative   Lives at home w/ her husband   Right-handed   Very little caffeine use, drinks 7up   Last colon screening: 09/2011   Social Determinants of Health   Financial Resource Strain: Low Risk  (04/20/2022)   Overall Financial Resource Strain (CARDIA)    Difficulty of Paying Living Expenses: Not hard at all  Food Insecurity: No Food Insecurity (04/20/2022)   Hunger Vital Sign    Worried About Running Out of Food in the Last Year: Never true    Ran Out of Food in the Last Year: Never true  Transportation Needs: No Transportation Needs (04/20/2022)   PRAPARE - Administrator, Civil Service (Medical): No  Lack of Transportation (Non-Medical): No  Physical Activity: Inactive (04/20/2022)   Exercise Vital Sign    Days of Exercise per Week: 0 days    Minutes of Exercise per Session: 0 min  Stress: Stress Concern Present (04/20/2022)   [employer] of Occupational Health - Occupational Stress Questionnaire    Feeling of Stress : To some extent  Social Connections: Moderately Integrated (04/20/2022)   Social Connection and Isolation Panel [NHANES]    Frequency of Communication with Friends and Family: More than three times a week    Frequency of Social Gatherings with Friends and Family: Once a week    Attends Religious Services: More than 4 times per year    Active Member of [employer] or Organizations: No    Attends Engineer, structural: Never    Marital Status: Married     Family History: The patient's family history includes Anesthesia problems in her mother; Brain cancer in her father; Breast cancer in her maternal aunt; Cancer in her mother; Hyperlipidemia in her father and mother; Hypertension in her father and mother; Ovarian cancer in her maternal grandmother; Subarachnoid hemorrhage in her mother.  ROS:   Please see the history of present illness.     All other systems reviewed and are  negative.  EKGs/Labs/Other Studies Reviewed:    The following studies were reviewed today:  Echo 2024  1. Left ventricular ejection fraction, by estimation, is 60 to 65%. Left  ventricular ejection fraction by PLAX is 56 %. The left ventricle has  normal function. The left ventricle has no regional wall motion  abnormalities. Left ventricular diastolic  parameters are consistent with Grade I diastolic dysfunction (impaired  relaxation).   2. Right ventricular systolic function is normal. The right ventricular  size is normal.   3. The mitral valve is normal in structure. No evidence of mitral valve  regurgitation. No evidence of mitral stenosis.   4. The aortic valve has an indeterminant number of cusps. Aortic valve  regurgitation is not visualized. No aortic stenosis is present.   5. The inferior vena cava is normal in size with greater than 50%  respiratory variability, suggesting right atrial pressure of 3 mmHg.   US carotid 01/2023 Summary:  Right Carotid: Velocities in the right ICA are consistent with a 1-39%  stenosis.   Left Carotid: Velocities in the left ICA are consistent with a 1-39%  stenosis.   Vertebrals: Bilateral vertebral arteries demonstrate antegrade flow.  Subclavians: Normal flow hemodynamics were seen in bilateral subclavian               arteries.   *See table(s) above for measurements and observations.   Heart monitor 01/2023    The patient was monitored for 2 days.   The predominant rhythm was sinus with an average rate of 80 bpm (range 55-119 bpm in sinus).   There were rare PAC's and PVC's.   Two supraventricular runs were observed, lasting up to 5 beats with a maximum rate of 152 bpm.   No sustained arrhythmia or prolonged pause was seen.   There were no patient triggered events.   Predominantly sinus rhythm with rare PAC's and PVC's as well as two brief supraventricular runs.  Please note, monitoring period was short (2 days).  Cardiac  04/2021 IMPRESSION: 1. Coronary calcium score of 31.9. This was 69th percentile for age and sex matched control.   2. Normal coronary origin with right dominance.   3. Calcified plaque causing mild proximal  LAD stenosis.   4. CAD-RADS 2. Mild non-obstructive CAD (25-49%). Consider non-atherosclerotic causes of chest pain. Consider preventive therapy and risk factor modification.   Electronically Signed: By: Debbe Odea M.D. On: 05/02/2021 16:46    EKG:  EKG is not ordered today.    Recent Labs: 08/30/2022: BUN 20; Creatinine, Ser 0.82; Potassium 4.4; Sodium 136  Recent Lipid Panel    Component Value Date/Time   CHOL 228 (H) 04/12/2021 1418   TRIG 105 04/12/2021 1418   HDL 65 04/12/2021 1418   CHOLHDL 3.5 04/12/2021 1418   LDLCALC 145 (H) 04/12/2021 1418    Physical Exam:    VS:  BP 108/68 (BP Location: Left Arm, Patient Position: Sitting)   Pulse 84   Ht 5\' 1"  (1.549 m)   Wt 111 lb 12.8 oz (50.7 kg)   SpO2 98%   BMI 21.12 kg/m     Wt Readings from Last 3 Encounters:  03/02/23 111 lb 12.8 oz (50.7 kg)  02/28/23 114 lb (51.7 kg)  01/08/23 110 lb (49.9 kg)     GEN:  Well nourished, well developed in no acute distress HEENT: Normal NECK: No JVD; No carotid bruits LYMPHATICS: No lymphadenopathy CARDIAC: RRR, no murmurs, rubs, gallops RESPIRATORY:  Clear to auscultation without rales, wheezing or rhonchi  ABDOMEN: Soft, non-tender, non-distended MUSCULOSKELETAL:  No edema; No deformity  SKIN: Warm and dry NEUROLOGIC:  Alert and oriented x 3 PSYCHIATRIC:  Normal affect   ASSESSMENT:    1. Pre-syncope   2. Orthostatic hypotension   3. Bilateral carotid artery stenosis   4. Precordial pain   5. Pre-operative cardiovascular examination    PLAN:    In order of problems listed above:  Pre-syncope Hypotension Recent Echo, carotid US and heart monitor were unremarkable. Patient denies any further low blood pressures or presyncope. BP today is 108/68.  She is not on BP lowering medications.   Carotid artery disease Recent carotid US showed 1-39% bilaterally. Continue Aspirin.   H/o chest pain Coronary calcium score of 31.9, 69th percentile for age and sex matched control, mild nonobstructive CAD. Continue Aspirin. 81mg  daily. No further ischemic work-up indicated at this time.   Pre-op evaluation She is scheduled for EGD/colonoscopy 10/18 for elevated CEA. Patient is overall stable from a cardiac perspective. Recent echo, US carotids and heart monitor were unremarkable. No further symptoms reported by the patient. According the RCRI she is Class I risk, 3.9% 30 day risk of death, MI or cardiac arrest.   Regarding ASA therapy, we recommend continuation of ASA throughout the perioperative period. However, if the surgeon feels that cessation of ASA is required in the perioperative period, it may be stopped 5-7 days prior to surgery with a plan to resume it as soon as felt to be feasible from a surgical standpoint in the post-operative period.   Disposition: Follow up in 6 month(s) with MD/APP    Signed, Rinaldo Macqueen David Stall, PA-C  03/02/2023 12:21 PM    [facility]

## 2023-03-02 NOTE — Patient Instructions (Signed)
Medication Instructions:  Your physician recommends that you continue on your current medications as directed. Please refer to the Current Medication list given to you today.   *If you need a refill on your cardiac medications before your next appointment, please call your pharmacy*   Lab Work: No labs ordered today    Testing/Procedures: No test ordered today    Follow-Up: At [facility], you and your health needs are our priority.  As part of our continuing mission to provide you with exceptional heart care, we have created designated Provider Care Teams.  These Care Teams include your primary Cardiologist (physician) and Advanced Practice Providers (APPs -  Physician Assistants and Nurse Practitioners) who all work together to provide you with the care you need, when you need it.  We recommend signing up for the patient portal called "MyChart".  Sign up information is provided on this After Visit Summary.  MyChart is used to connect with patients for Virtual Visits (Telemedicine).  Patients are able to view lab/test results, encounter notes, upcoming appointments, etc.  Non-urgent messages can be sent to your provider as well.   To learn more about what you can do with MyChart, go to [email or web address].    Your next appointment:   6 month(s)  Provider:   You may see Yvonne Kendall, MD or one of the following Advanced Practice Providers on your designated Care Team:   Nicolasa Ducking, NP Eula Listen, PA-C Cadence Fransico Michael, PA-C Charlsie Quest, NP

## 2023-03-25 ENCOUNTER — Encounter: Payer: Self-pay | Admitting: Certified Registered Nurse Anesthetist

## 2023-03-30 ENCOUNTER — Encounter: Payer: Self-pay | Admitting: Gastroenterology

## 2023-03-30 ENCOUNTER — Ambulatory Visit: Payer: Medicare Other | Admitting: Gastroenterology

## 2023-03-30 VITALS — BP 157/86 | HR 60 | Temp 97.8°F | Resp 12 | Ht 61.0 in | Wt 114.0 lb

## 2023-03-30 DIAGNOSIS — D122 Benign neoplasm of ascending colon: Secondary | ICD-10-CM

## 2023-03-30 DIAGNOSIS — Z09 Encounter for follow-up examination after completed treatment for conditions other than malignant neoplasm: Secondary | ICD-10-CM

## 2023-03-30 DIAGNOSIS — K319 Disease of stomach and duodenum, unspecified: Secondary | ICD-10-CM | POA: Diagnosis not present

## 2023-03-30 DIAGNOSIS — K2970 Gastritis, unspecified, without bleeding: Secondary | ICD-10-CM | POA: Diagnosis not present

## 2023-03-30 DIAGNOSIS — R970 Elevated carcinoembryonic antigen [CEA]: Secondary | ICD-10-CM | POA: Diagnosis not present

## 2023-03-30 DIAGNOSIS — Z860100 Personal history of colon polyps, unspecified: Secondary | ICD-10-CM | POA: Diagnosis not present

## 2023-03-30 DIAGNOSIS — K3184 Gastroparesis: Secondary | ICD-10-CM | POA: Diagnosis not present

## 2023-03-30 DIAGNOSIS — D123 Benign neoplasm of transverse colon: Secondary | ICD-10-CM

## 2023-03-30 MED ORDER — SODIUM CHLORIDE 0.9 % IV SOLN: 500.0000 mL | INTRAVENOUS | Status: DC

## 2023-03-30 NOTE — Progress Notes (Signed)
1534 Robinul 0.1 mg IV given due large amount of secretions upon assessment.  MD made aware, vss

## 2023-03-30 NOTE — Op Note (Signed)
[facility] Patient Name: Michaela Hall Procedure Date: 03/30/2023 3:25 PM MRN: [id number] Endoscopist: Viviann Spare P. Adela Lank , MD, [id number] Age: 67 Referring MD:  Date of Birth: [birth year] Gender: Female Account #: [id number] Procedure:                Upper GI endoscopy Indications:              history of gastroparesis, on Zegerid once daily.                            Elevated CEA level Medicines:                Monitored Anesthesia Care Procedure:                Pre-Anesthesia Assessment:                           - Prior to the procedure, a History and Physical                            was performed, and patient medications and                            allergies were reviewed. The patient's tolerance of                            previous anesthesia was also reviewed. The risks                            and benefits of the procedure and the sedation                            options and risks were discussed with the patient.                            All questions were answered, and informed consent                            was obtained. Prior Anticoagulants: The patient has                            taken no anticoagulant or antiplatelet agents. ASA                            Grade Assessment: II - A patient with mild systemic                            disease. After reviewing the risks and benefits,                            the patient was deemed in satisfactory condition to                            undergo the procedure.  After obtaining informed consent, the endoscope was                            passed under direct vision. Throughout the                            procedure, the patient's blood pressure, pulse, and                            oxygen saturations were monitored continuously. The                            Olympus Scope [id number] was introduced through the                            mouth, and advanced to  the second part of duodenum.                            The upper GI endoscopy was accomplished without                            difficulty. The patient tolerated the procedure                            well. Scope In: Scope Out: Findings:                 Esophagogastric landmarks were identified: the                            Z-line was found at 37 cm, the gastroesophageal                            junction was found at 37 cm and the upper extent of                            the gastric folds was found at 37 cm from the                            incisors.                           The exam of the esophagus was otherwise normal.                           Patchy inflammation characterized by erosions,                            erythema and granularity was found in the gastric                            antrum.                           The exam of the stomach was otherwise normal.  Biopsies were taken with a cold forceps for                            Helicobacter pylori testing.                           A benign lymphangiectasia was present in the second                            portion of the duodenum.                           The exam of the duodenum was otherwise normal. Complications:            No immediate complications. Estimated blood loss:                            Minimal. Estimated Blood Loss:     Estimated blood loss was minimal. Impression:               - Esophagogastric landmarks identified.                           - Normal esophagus otherwise.                           - Gastritis.                           - Normal stomach otherwise - no outlet obstruction,                            biopsies taken to rule out H pylori                           - Duodenal mucosal lymphangiectasia.                           - Normal duodenum otherwise.                           No evidence of malignancy in regards to elevated                             CEA level. Recommendation:           - Patient has a contact number available for                            emergencies. The signs and symptoms of potential                            delayed complications were discussed with the                            patient. Return to normal activities tomorrow.  Written discharge instructions were provided to the                            patient.                           - Resume previous diet.                           - Continue present medications.                           - Increase Zegerid to twice daily for a few weeks                            given gastritis noted on this exam                           - Await pathology results. Viviann Spare P. Giomar Gusler, MD 03/30/2023 4:28:06 PM This report has been signed electronically.

## 2023-03-30 NOTE — Patient Instructions (Signed)
Discharge instructions given. Handouts on polyps,Diverticulosis,Hemorrhoids and Gastritis. Resume previous diet. Continue present medications. Increase Zegrid to twice daily for a few weeks given Gastritis noted on exam. YOU HAD AN ENDOSCOPIC PROCEDURE TODAY AT THE [facility]:   Refer to the procedure report that was given to you for any specific questions about what was found during the examination.  If the procedure report does not answer your questions, please call your gastroenterologist to clarify.  If you requested that your care partner not be given the details of your procedure findings, then the procedure report has been included in a sealed envelope for you to review at your convenience later.  YOU SHOULD EXPECT: Some feelings of bloating in the abdomen. Passage of more gas than usual.  Walking can help get rid of the air that was put into your GI tract during the procedure and reduce the bloating. If you had a lower endoscopy (such as a colonoscopy or flexible sigmoidoscopy) you may notice spotting of blood in your stool or on the toilet paper. If you underwent a bowel prep for your procedure, you may not have a normal bowel movement for a few days.  Please Note:  You might notice some irritation and congestion in your nose or some drainage.  This is from the oxygen used during your procedure.  There is no need for concern and it should clear up in a day or so.  SYMPTOMS TO REPORT IMMEDIATELY:  Following lower endoscopy (colonoscopy or flexible sigmoidoscopy):  Excessive amounts of blood in the stool  Significant tenderness or worsening of abdominal pains  Swelling of the abdomen that is new, acute  Fever of 100F or higher  Following upper endoscopy (EGD)  Vomiting of blood or coffee ground material  New chest pain or pain under the shoulder blades  Painful or persistently difficult swallowing  New shortness of breath  Fever of 100F or higher  Black, tarry-looking  stools  For urgent or emergent issues, a gastroenterologist can be reached at any hour by calling [phone]. Do not use MyChart messaging for urgent concerns.    DIET:  We do recommend a small meal at first, but then you may proceed to your regular diet.  Drink plenty of fluids but you should avoid alcoholic beverages for 24 hours.  ACTIVITY:  You should plan to take it easy for the rest of today and you should NOT DRIVE or use heavy machinery until tomorrow (because of the sedation medicines used during the test).    FOLLOW UP: Our staff will call the number listed on your records the next business day following your procedure.  We will call around 7:15- 8:00 am to check on you and address any questions or concerns that you may have regarding the information given to you following your procedure. If we do not reach you, we will leave a message.     If any biopsies were taken you will be contacted by phone or by letter within the next 1-3 weeks.  Please call us at [phone] if you have not heard about the biopsies in 3 weeks.    SIGNATURES/CONFIDENTIALITY: You and/or your care partner have signed paperwork which will be entered into your electronic medical record.  These signatures attest to the fact that that the information above on your After Visit Summary has been reviewed and is understood.  Full responsibility of the confidentiality of this discharge information lies with you and/or your care-partner.

## 2023-03-30 NOTE — Op Note (Addendum)
[facility] Patient Name: Michaela Hall Procedure Date: 03/30/2023 3:11 PM MRN: [id number] Endoscopist: Viviann Spare P. Adela Lank , MD, [id number] Age: 67 Referring MD:  Date of Birth: 07/30/1955 Gender: Female Account #: [id number] Procedure:                Colonoscopy Indications:              High risk colon cancer surveillance: Personal                            history of colonic polyps - 5 polyps removed                            03/2022 including an advanced lesion, but poor                            prep. CEA level elevated x multiple blood draws,                            rule out malignancy. DOUBLE PREP USED FOR THIS EXAM Medicines:                Monitored Anesthesia Care Procedure:                Pre-Anesthesia Assessment:                           - Prior to the procedure, a History and Physical                            was performed, and patient medications and                            allergies were reviewed. The patient's tolerance of                            previous anesthesia was also reviewed. The risks                            and benefits of the procedure and the sedation                            options and risks were discussed with the patient.                            All questions were answered, and informed consent                            was obtained. Prior Anticoagulants: The patient has                            taken no anticoagulant or antiplatelet agents. ASA                            Grade Assessment: II - A patient with mild systemic  disease. After reviewing the risks and benefits,                            the patient was deemed in satisfactory condition to                            undergo the procedure.                           After obtaining informed consent, the colonoscope                            was passed under direct vision. Throughout the                            procedure, the  patient's blood pressure, pulse, and                            oxygen saturations were monitored continuously. The                            [id number]-[id number] Slim SN [id number] was introduced through                            the anus and advanced to the the cecum, identified                            by appendiceal orifice and ileocecal valve. The                            colonoscopy was performed without difficulty. The                            patient tolerated the procedure well. The quality                            of the bowel preparation was good. The ileocecal                            valve, appendiceal orifice, and rectum were                            photographed. Scope In: 3:49:38 PM Scope Out: 4:14:24 PM Scope Withdrawal Time: 0 hours 21 minutes 49 seconds  Total Procedure Duration: 0 hours 24 minutes 46 seconds  Findings:                 Hemorrhoids were found on perianal exam.                           Limited views of the distal terminal ileum appeared                            normal.  An area of altered mucosa was found at the                            appendiceal orifice - subtle possible sessile                            serrated polypoid lesion vs. normal variant, coud                            not see clear margin. Biopsies were taken with a                            cold forceps for histology to assess for subtle                            sessile serrated lesion.                           A 3 mm polyp was found in the ascending colon. The                            polyp was sessile. The polyp was removed with a                            cold snare. Resection and retrieval were complete.                           A 4 mm polyp was found in the hepatic flexure. The                            polyp was sessile. The polyp was removed with a                            cold snare. Resection and retrieval were complete.                            A diminutive polyp was found in the transverse                            colon. The polyp was sessile. The polyp was removed                            with a cold snare. Resection and retrieval were                            complete.                           Scattered small-mouthed diverticula were found in                            the entire colon.  Internal hemorrhoids were found. The hemorrhoids                            were small.                           The exam was otherwise without abnormality. 2                            passes made through the colon - no evidence of                            concerning pathology noted to cause elevated CEA                            level. Complications:            No immediate complications. Estimated blood loss:                            Minimal. Estimated Blood Loss:     Estimated blood loss was minimal. Impression:               - Hemorrhoids found on perianal exam.                           - Limited views of the very distal ileum was normal.                           - Altered mucosa at the appendiceal orifice.                            Biopsied.                           - One 3 mm polyp in the ascending colon, removed                            with a cold snare. Resected and retrieved.                           - One 4 mm polyp at the hepatic flexure, removed                            with a cold snare. Resected and retrieved.                           - One diminutive polyp in the transverse colon,                            removed with a cold snare. Resected and retrieved.                           - Diverticulosis in the entire examined colon.                           -  Internal hemorrhoids.                           - The examination was otherwise normal.                           No evidence of malignancy on this exam, in relation                            to elevated CEA  level. Recommendation:           - Patient has a contact number available for                            emergencies. The signs and symptoms of potential                            delayed complications were discussed with the                            patient. Return to normal activities tomorrow.                            Written discharge instructions were provided to the                            patient.                           - Resume previous diet.                           - Continue present medications.                           - Await pathology results. Viviann Spare P. Amahd Morino, MD 03/30/2023 4:22:20 PM This report has been signed electronically.

## 2023-03-30 NOTE — Progress Notes (Signed)
Report given to PACU, vss 

## 2023-03-30 NOTE — Progress Notes (Signed)
Pt's states no medical or surgical changes since previsit or office visit. 

## 2023-03-30 NOTE — Progress Notes (Signed)
Called to room to assist during endoscopic procedure.  Patient ID and intended procedure confirmed with present staff. Received instructions for my participation in the procedure from the performing physician.  

## 2023-03-30 NOTE — Progress Notes (Signed)
1603 Ephedrine 10 mg given IV due to low BP, MD updated.

## 2023-03-30 NOTE — Progress Notes (Signed)
Gastroenterology History and Physical   Primary Care Physician:  Ardith Dark, MD   Reason for Procedure:   History of colon polyps, elevated CEA level, gastroparesis  Plan:    EGD and colonoscopy     HPI: Michaela Hall is a 67 y.o. female  here for EGD and colonoscopy to evaluate issues as outlined. See note 02/28/23 - no interval changes. EGD to evaluate gastroparesis symptoms and elevated CEA. HIstory of colon polyps, last exam 03/2022 - 5 polyps removed largest was 15mm. Poor prep noted at the time. CEA levels noted to be in the 11s. Will rule out neoplasm. She has chronic constipation. Double prep used. She reports it worked well for her. Motegrity otherwise working well for constipation.   Otherwise feels well without any cardiopulmonary symptoms.   I have discussed risks / benefits of anesthesia and endoscopic procedure with Michaela Hall and they wish to proceed with the exams as outlined today.    Past Medical History:  Diagnosis Date   Anxiety    Cervical spondylosis without myelopathy 10/21/2015   Cervical spondylosis without myelopathy 10/21/2015   Chronic pain    back and right hip   Complication of anesthesia    Depression    DJD (degenerative joint disease)    Hashimoto's disease    Headache(784.0)    Loss of weight 08/30/2022   Nausea and vomiting 08/30/2022   Osteoarthritis 09/2022   bilateral hip   PONV (postoperative nausea and vomiting)    Raynaud disease    Raynaud's disease    Weakness of left arm 10/21/2015    Past Surgical History:  Procedure Laterality Date   BACK SURGERY     ESOPHAGOGASTRODUODENOSCOPY  09/28/2011   Procedure: ESOPHAGOGASTRODUODENOSCOPY (EGD);  Surgeon: Vertell Novak., MD;  Location: [facility];  Service: Endoscopy;  Laterality: N/A;   HIP SURGERY     KNEE SURGERY     SPINE SURGERY     TONSILLECTOMY      Prior to Admission medications   Medication Sig Start Date End Date Taking? Authorizing Provider   clonazePAM (KLONOPIN) 1 MG tablet Take 1 tablet (1 mg total) by mouth 3 (three) times daily as needed for anxiety. 08/10/22  Yes Ardith Dark, MD  cyanocobalamin (VITAMIN B12) 1000 MCG/ML injection INJECT ONCE A WEEK FOR 30 DAYS   Yes [provider]  diclofenac Sodium (VOLTAREN) 1 % GEL Apply topically. 01/08/23  Yes [provider]  estradiol (ESTRACE) 2 MG tablet Take 2 mg by mouth daily. 11/14/22  Yes [provider]  levothyroxine (SYNTHROID) 25 MCG tablet Take 1 tablet (25 mcg total) by mouth daily. 03/29/21  Yes Ardith Dark, MD  mirtazapine (REMERON) 30 MG tablet Take 30 mg by mouth at bedtime.   Yes [provider]  ondansetron (ZOFRAN-ODT) 8 MG disintegrating tablet Take 1 tablet by mouth 3 (three) times daily. Take 1 tab by mouth tid 08/03/15  Yes [provider]  Oxycodone HCl 10 MG TABS Take 10 mg by mouth every 4 (four) hours as needed (pain). 08/26/20  Yes [provider]  progesterone (PROMETRIUM) 200 MG capsule Take 200 mg by mouth daily.   Yes [provider]  Prucalopride Succinate (MOTEGRITY) 2 MG TABS Take 1 tablet (2 mg total) by mouth daily. 02/28/23  Yes Halston Kintz, Willaim Rayas, MD  traZODone (DESYREL) 50 MG tablet Take 50 mg by mouth at bedtime.   Yes [provider]  aspirin EC 81 MG  tablet Take 1 tablet (81 mg total) by mouth daily. Swallow whole. 05/03/21   Sondra Barges, PA-C  denosumab (PROLIA) 60 MG/ML SOSY injection 60mg  Subcutaneous every 6 months 06/16/21   [provider]  naloxone [facility]) nasal spray 4 mg/0.1 mL     [provider]  omeprazole-sodium bicarbonate (ZEGERID) 40-1100 MG capsule Take 1 capsule by mouth daily before breakfast. 12/29/22   Sherl Yzaguirre, Willaim Rayas, MD  REPATHA 140 MG/ML SOSY Inject into the skin. 02/22/23   [provider]  Vitamin D, Ergocalciferol, (DRISDOL) 1.25 MG (50000 UNIT) CAPS capsule TAKE 1 CAPSULE BY MOUTH ONE TIME PER WEEK FOR 30 DAYS     [provider]    Current Outpatient Medications  Medication Sig Dispense Refill   clonazePAM (KLONOPIN) 1 MG tablet Take 1 tablet (1 mg total) by mouth 3 (three) times daily as needed for anxiety. 90 tablet 5   cyanocobalamin (VITAMIN B12) 1000 MCG/ML injection INJECT ONCE A WEEK FOR 30 DAYS     diclofenac Sodium (VOLTAREN) 1 % GEL Apply topically.     estradiol (ESTRACE) 2 MG tablet Take 2 mg by mouth daily.     levothyroxine (SYNTHROID) 25 MCG tablet Take 1 tablet (25 mcg total) by mouth daily. 90 tablet 0   mirtazapine (REMERON) 30 MG tablet Take 30 mg by mouth at bedtime.     ondansetron (ZOFRAN-ODT) 8 MG disintegrating tablet Take 1 tablet by mouth 3 (three) times daily. Take 1 tab by mouth tid     Oxycodone HCl 10 MG TABS Take 10 mg by mouth every 4 (four) hours as needed (pain).     progesterone (PROMETRIUM) 200 MG capsule Take 200 mg by mouth daily.     Prucalopride Succinate (MOTEGRITY) 2 MG TABS Take 1 tablet (2 mg total) by mouth daily. 90 tablet 3   traZODone (DESYREL) 50 MG tablet Take 50 mg by mouth at bedtime.     aspirin EC 81 MG tablet Take 1 tablet (81 mg total) by mouth daily. Swallow whole. 90 tablet 3   denosumab (PROLIA) 60 MG/ML SOSY injection 60mg  Subcutaneous every 6 months     naloxone (NARCAN) nasal spray 4 mg/0.1 mL      omeprazole-sodium bicarbonate (ZEGERID) 40-1100 MG capsule Take 1 capsule by mouth daily before breakfast.     REPATHA 140 MG/ML SOSY Inject into the skin.     Vitamin D, Ergocalciferol, (DRISDOL) 1.25 MG (50000 UNIT) CAPS capsule TAKE 1 CAPSULE BY MOUTH ONE TIME PER WEEK FOR 30 DAYS     Current Facility-Administered Medications  Medication Dose Route Frequency Provider Last Rate Last Admin   0.9 %  sodium chloride infusion  500 mL Intravenous Continuous Siaosi Alter, Willaim Rayas, MD        Allergies as of 03/30/2023 - Review Complete 03/30/2023  Allergen Reaction Noted   Gentamycin [gentamicin] Anaphylaxis 05/02/2021   Alendronate   01/08/2023   Statins  01/08/2023    Family History  Problem Relation Age of Onset   Anesthesia problems Mother    Cancer Mother        ovary/uterus & breast   Hyperlipidemia Mother    Hypertension Mother    Subarachnoid hemorrhage Mother    Hyperlipidemia Father    Hypertension Father    Brain cancer Father    Breast cancer Maternal Aunt    Ovarian cancer Maternal Grandmother     Social History   Socioeconomic History   Marital status: Married    Spouse name:  Not on file   Number of children: 3   Years of education: Not on file   Highest education level: Not on file  Occupational History   Occupation: Medicare ins. sales    Employer: [employer]  Tobacco Use   Smoking status: Former    Current packs/day: 0.00    Average packs/day: 0.5 packs/day for 20.0 years (10.0 ttl pk-yrs)    Types: Cigarettes    Start date: 06/12/1994    Quit date: 06/12/2014    Years since quitting: 8.8   Smokeless tobacco: Never  Vaping Use   Vaping status: Some Days   Devices: does not include nicotine  Substance and Sexual Activity   Alcohol use: No    Alcohol/week: 0.0 standard drinks of alcohol   Drug use: No   Sexual activity: Not on file  Other Topics Concern   Not on file  Social History Narrative   Lives at home w/ her husband   Right-handed   Very little caffeine use, drinks 7up   Last colon screening: 09/2011   Social Determinants of Health   Financial Resource Strain: Low Risk  (04/20/2022)   Overall Financial Resource Strain (CARDIA)    Difficulty of Paying Living Expenses: Not hard at all  Food Insecurity: No Food Insecurity (04/20/2022)   Hunger Vital Sign    Worried About Running Out of Food in the Last Year: Never true    Ran Out of Food in the Last Year: Never true  Transportation Needs: No Transportation Needs (04/20/2022)   PRAPARE - Administrator, Civil Service (Medical): No    Lack of Transportation (Non-Medical): No  Physical Activity: Inactive  (04/20/2022)   Exercise Vital Sign    Days of Exercise per Week: 0 days    Minutes of Exercise per Session: 0 min  Stress: Stress Concern Present (04/20/2022)   [employer] of Occupational Health - Occupational Stress Questionnaire    Feeling of Stress : To some extent  Social Connections: Moderately Integrated (04/20/2022)   Social Connection and Isolation Panel [NHANES]    Frequency of Communication with Friends and Family: More than three times a week    Frequency of Social Gatherings with Friends and Family: Once a week    Attends Religious Services: More than 4 times per year    Active Member of [employer] or Organizations: No    Attends Banker Meetings: Never    Marital Status: Married  Catering manager Violence: Not At Risk (04/20/2022)   Humiliation, Afraid, Rape, and Kick questionnaire    Fear of Current or Ex-Partner: No    Emotionally Abused: No    Physically Abused: No    Sexually Abused: No    Review of Systems: All other review of systems negative except as mentioned in the HPI.  Physical Exam: Vital signs BP (!) 100/54   Pulse 63   Temp 97.8 F (36.6 C)   Resp 10   Ht 5\' 1"  (1.549 m)   Wt 114 lb (51.7 kg)   SpO2 98%   BMI 21.54 kg/m   General:   Alert,  Well-developed, pleasant and cooperative in NAD Lungs:  Clear throughout to auscultation.   Heart:  Regular rate and rhythm Abdomen:  Soft, nontender and nondistended.   Neuro/Psych:  Alert and cooperative. Normal mood and affect. A and O x 3  Harlin Rain, MD [facility]

## 2023-04-02 ENCOUNTER — Telehealth: Payer: Self-pay

## 2023-04-02 NOTE — Telephone Encounter (Signed)
Post procedure follow up call, no answer 

## 2023-04-05 LAB — SURGICAL PATHOLOGY

## 2023-04-20 ENCOUNTER — Other Ambulatory Visit: Payer: Self-pay | Admitting: Family Medicine

## 2023-06-20 ENCOUNTER — Ambulatory Visit (INDEPENDENT_AMBULATORY_CARE_PROVIDER_SITE_OTHER): Payer: Medicare Other

## 2023-06-20 VITALS — BP 115/78 | HR 74 | Wt 114.0 lb

## 2023-06-20 DIAGNOSIS — Z0000 Encounter for general adult medical examination without abnormal findings: Secondary | ICD-10-CM

## 2023-06-20 NOTE — Patient Instructions (Signed)
 Michaela Hall , Thank you for taking time to come for your Medicare Wellness Visit. I appreciate your ongoing commitment to your health goals. Please review the following plan we discussed and let me know if I can assist you in the future.   Referrals/Orders/Follow-Ups/Clinician Recommendations: Aim for 30 minutes of exercise or brisk walking, 6-8 glasses of water, and 5 servings of fruits and vegetables each day.   This is a list of the screening recommended for you and due dates:  Health Maintenance  Topic Date Due   Hepatitis C Screening  Never done   DTaP/Tdap/Td vaccine (1 - Tdap) Never done   Zoster (Shingles) Vaccine (1 of 2) Never done   Mammogram  11/23/2017   Flu Shot  Never done   COVID-19 Vaccine (3 - 2024-25 season) 02/11/2023   Pneumonia Vaccine (1 of 1 - PCV) 08/10/2023*   Medicare Annual Wellness Visit  06/19/2024   Colon Cancer Screening  03/29/2026   DEXA scan (bone density measurement)  Completed   HPV Vaccine  Aged Out  *Topic was postponed. The date shown is not the original due date.    Advanced directives: (Declined) Advance directive discussed with you today. Even though you declined this today, please call our office should you change your mind, and we can give you the proper paperwork for you to fill out.  Next Medicare Annual Wellness Visit scheduled for next year: Yes

## 2023-06-20 NOTE — Progress Notes (Deleted)
 Subjective:   Michaela Hall is a 68 y.o. female who presents for Medicare Annual (Subsequent) preventive examination.  Visit Complete: Virtual I connected with  Michaela Hall on 06/20/23 by a video and audio enabled telemedicine application and verified that I am speaking with the correct person using two identifiers.  Patient Location: Home  Provider Location: Home Office  I discussed the limitations of evaluation and management by telemedicine. The patient expressed understanding and agreed to proceed.  Vital Signs: Because this visit was a virtual/telehealth visit, some criteria may be missing or patient reported. Any vitals not documented were not able to be obtained and vitals that have been documented are patient reported.      Objective:    Today's Vitals   06/20/23 1452  BP: 115/78  Pulse: 74  Weight: 114 lb (51.7 kg)   Body mass index is 21.54 kg/m.     06/20/2023    3:10 PM 04/20/2022    1:05 PM 07/23/2020    1:19 AM 09/28/2011    9:00 AM  Advanced Directives  Does Patient Have a Medical Advance Directive? No Yes No Patient does not have advance directive  Type of Customer Service Manager Power of [place];Living will    Copy of Healthcare Power of Attorney in Chart?  No - copy requested    Would patient like information on creating a medical advance directive? No - Patient declined  No - Patient declined   Pre-existing out of facility DNR order (yellow form or pink MOST form)    No    Current Medications (verified) Outpatient Encounter Medications as of 06/20/2023  Medication Sig   aspirin  EC 81 MG tablet Take 1 tablet (81 mg total) by mouth daily. Swallow whole.   clonazePAM  (KLONOPIN ) 1 MG tablet TAKE 1 TABLET BY MOUTH 3 TIMES DAILY AS NEEDED FOR ANXIETY.   cyanocobalamin (VITAMIN B12) 1000 MCG/ML injection INJECT ONCE A WEEK FOR 30 DAYS   denosumab (PROLIA) 60 MG/ML SOSY injection 60mg  Subcutaneous every 6 months   diclofenac Sodium (VOLTAREN) 1 %  GEL Apply topically.   estradiol (ESTRACE) 2 MG tablet Take 2 mg by mouth daily.   levothyroxine  (SYNTHROID ) 25 MCG tablet Take 1 tablet (25 mcg total) by mouth daily.   mirtazapine (REMERON) 30 MG tablet Take 30 mg by mouth at bedtime.   naloxone  (NARCAN ) nasal spray 4 mg/0.1 mL    omeprazole -sodium bicarbonate  (ZEGERID ) 40-1100 MG capsule Take 1 capsule by mouth daily before breakfast.   ondansetron  (ZOFRAN -ODT) 8 MG disintegrating tablet Take 1 tablet by mouth 3 (three) times daily. Take 1 tab by mouth tid   Oxycodone  HCl 10 MG TABS Take 10 mg by mouth every 4 (four) hours as needed (pain).   progesterone  (PROMETRIUM ) 200 MG capsule Take 200 mg by mouth daily.   traZODone  (DESYREL ) 50 MG tablet Take 50 mg by mouth at bedtime.   Vitamin D, Ergocalciferol, (DRISDOL) 1.25 MG (50000 UNIT) CAPS capsule TAKE 1 CAPSULE BY MOUTH ONE TIME PER WEEK FOR 30 DAYS   Prucalopride Succinate  (MOTEGRITY ) 2 MG TABS Take 1 tablet (2 mg total) by mouth daily. (Patient not taking: Reported on 06/20/2023)   REPATHA 140 MG/ML SOSY Inject into the skin. (Patient not taking: Reported on 06/20/2023)   No facility-administered encounter medications on file as of 06/20/2023.    Allergies (verified) Gentamycin [gentamicin], Alendronate, and Statins   History: Past Medical History:  Diagnosis Date   Anxiety    Cervical spondylosis without  myelopathy 10/21/2015   Cervical spondylosis without myelopathy 10/21/2015   Chronic pain    back and right hip   Complication of anesthesia    Depression    DJD (degenerative joint disease)    Hashimoto's disease    Headache(784.0)    Loss of weight 08/30/2022   Nausea and vomiting 08/30/2022   Osteoarthritis 09/2022   bilateral hip   PONV (postoperative nausea and vomiting)    Raynaud disease    Raynaud's disease    Weakness of left arm 10/21/2015   Past Surgical History:  Procedure Laterality Date   BACK SURGERY     ESOPHAGOGASTRODUODENOSCOPY  09/28/2011   Procedure:  ESOPHAGOGASTRODUODENOSCOPY (EGD);  Surgeon: Lynwood LITTIE Celestia Mickey., MD;  Location: [facility];  Service: Endoscopy;  Laterality: N/A;   HIP SURGERY     KNEE SURGERY     SPINE SURGERY     TONSILLECTOMY     Family History  Problem Relation Age of Onset   Anesthesia problems Mother    Cancer Mother        ovary/uterus & breast   Hyperlipidemia Mother    Hypertension Mother    Subarachnoid hemorrhage Mother    Hyperlipidemia Father    Hypertension Father    Brain cancer Father    Breast cancer Maternal Aunt    Ovarian cancer Maternal Grandmother    Social History   Socioeconomic History   Marital status: Married    Spouse name: Not on file   Number of children: 3   Years of education: Not on file   Highest education level: Not on file  Occupational History   Occupation: Medicare ins. Investment Banker, Corporate: [employer]  Tobacco Use   Smoking status: Former    Current packs/day: 0.00    Average packs/day: 0.5 packs/day for 20.0 years (10.0 ttl pk-yrs)    Types: Cigarettes    Start date: 06/12/1994    Quit date: 06/12/2014    Years since quitting: 9.0   Smokeless tobacco: Never  Vaping Use   Vaping status: Some Days   Devices: does not include nicotine  Substance and Sexual Activity   Alcohol use: No    Alcohol/week: 0.0 standard drinks of alcohol   Drug use: No   Sexual activity: Not on file  Other Topics Concern   Not on file  Social History Narrative   Lives at home w/ her husband   Right-handed   Very little caffeine use, drinks 7up   Last colon screening: 09/2011   Social Drivers of Health   Financial Resource Strain: Low Risk  (06/20/2023)   Overall Financial Resource Strain (CARDIA)    Difficulty of Paying Living Expenses: Not hard at all  Food Insecurity: No Food Insecurity (06/20/2023)   Hunger Vital Sign    Worried About Running Out of Food in the Last Year: Never true    Ran Out of Food in the Last Year: Never true  Transportation Needs: No Transportation  Needs (06/20/2023)   PRAPARE - Administrator, Civil Service (Medical): No    Lack of Transportation (Non-Medical): No  Physical Activity: Inactive (06/20/2023)   Exercise Vital Sign    Days of Exercise per Week: 0 days    Minutes of Exercise per Session: 0 min  Stress: Stress Concern Present (06/20/2023)   [employer] of Occupational Health - Occupational Stress Questionnaire    Feeling of Stress : Very much  Social Connections: Moderately Integrated (06/20/2023)   Social  Connection and Isolation Panel [NHANES]    Frequency of Communication with Friends and Family: Three times a week    Frequency of Social Gatherings with Friends and Family: Twice a week    Attends Religious Services: More than 4 times per year    Active Member of Clubs or Organizations: No    Attends Banker Meetings: Never    Marital Status: Married    Tobacco Counseling Counseling given: Not Answered   Clinical Intake:  Pre-visit preparation completed: Yes  Pain : No/denies pain     BMI - recorded: 21.54 Nutritional Status: BMI of 19-24  Normal Nutritional Risks: None Diabetes: No  How often do you need to have someone help you when you read instructions, pamphlets, or other written materials from your doctor or pharmacy?: 1 - Never  Interpreter Needed?: No  Information entered by :: Ellouise Haws, LPN   Activities of Daily Living     No data to display          Patient Care Team: Kennyth Worth HERO, MD as PCP - General (Family Medicine) End, Lonni, MD as PCP - Cardiology (Cardiology) Bonner Ade, MD as Consulting Physician (Physical Medicine and Rehabilitation)  Indicate any recent Medical Services you may have received from other than [facility] providers in the past year (date may be approximate).     Assessment:   This is a routine wellness examination for Michaela Hall.  Hearing/Vision screen Hearing Screening - Comments:: Pt denies any hearing issues  Vision  Screening - Comments:: Pt follows up with Dr Octavia for annual eye exams    Goals Addressed   None    Depression Screen    06/20/2023    3:07 PM 08/10/2022   10:48 AM 04/20/2022    1:01 PM 01/23/2022   11:24 AM 03/23/2021   11:31 AM 08/27/2020    3:32 PM 11/08/2016    9:15 AM  PHQ 2/9 Scores  PHQ - 2 Score 1 4 1  0 0 0 0  PHQ- 9 Score  15         Fall Risk    06/20/2023    3:10 PM 08/10/2022    9:55 AM 04/20/2022    1:06 PM 01/23/2022   11:24 AM 11/08/2016    9:15 AM  Fall Risk   Falls in the past year? 0 0 0 0 No  Number falls in past yr: 0 0 0 0   Injury with Fall? 0 0 0 0   Risk for fall due to : Impaired mobility No Fall Risks Impaired vision;Impaired mobility    Follow up Falls prevention discussed  Falls prevention discussed      MEDICARE RISK AT HOME: Medicare Risk at Home Any stairs in or around the home?: Yes If so, are there any without handrails?: No Home free of loose throw rugs in walkways, pet beds, electrical cords, etc?: Yes Adequate lighting in your home to reduce risk of falls?: Yes Life alert?: No Use of a cane, walker or w/c?: No Grab bars in the bathroom?: Yes Shower chair or bench in shower?: Yes Elevated toilet seat or a handicapped toilet?: No  TIMED UP AND GO:  Was the test performed?  No    Cognitive Function:        06/20/2023    3:11 PM 04/20/2022    1:08 PM  6CIT Screen  What Year? 0 points 0 points  What month? 0 points 0 points  What time? 0 points 0 points  Count back from 20 0 points 0 points  Months in reverse 0 points 0 points  Repeat phrase 0 points 0 points  Total Score 0 points 0 points    Immunizations     Flu Vaccine status: Due, Education has been provided regarding the importance of this vaccine. Advised may receive this vaccine at local pharmacy or Health Dept. Aware to provide a copy of the vaccination record if obtained from local pharmacy or Health Dept. Verbalized acceptance and understanding.  Pneumococcal  vaccine status: Due, Education has been provided regarding the importance of this vaccine. Advised may receive this vaccine at local pharmacy or Health Dept. Aware to provide a copy of the vaccination record if obtained from local pharmacy or Health Dept. Verbalized acceptance and understanding.  Covid-19 vaccine status: Information provided on how to obtain vaccines.   Qualifies for Shingles Vaccine? Yes   Zostavax completed Yes   Shingrix Completed?: Yes  Screening Tests Health Maintenance  Topic Date Due   Hepatitis C Screening  Never done   DTaP/Tdap/Td (1 - Tdap) Never done   Zoster Vaccines- Shingrix (1 of 2) Never done   MAMMOGRAM  11/23/2017   COVID-19 Vaccine (3 - Pfizer risk series) 11/02/2019   INFLUENZA VACCINE  Never done   Pneumonia Vaccine 6+ Years old (1 of 1 - PCV) 08/10/2023 (Originally 12/12/2020)   Medicare Annual Wellness (AWV)  06/19/2024   Colonoscopy  03/29/2026   DEXA SCAN  Completed   HPV VACCINES  Aged Out    Health Maintenance  Health Maintenance Due  Topic Date Due   Hepatitis C Screening  Never done   DTaP/Tdap/Td (1 - Tdap) Never done   Zoster Vaccines- Shingrix (1 of 2) Never done   MAMMOGRAM  11/23/2017   COVID-19 Vaccine (3 - Pfizer risk series) 11/02/2019   INFLUENZA VACCINE  Never done    Colorectal cancer screening: Type of screening: Colonoscopy. Completed 03/30/23. Repeat every 3 years  Mammogram status: Ordered 06/20/23. Pt provided with contact info and advised to call to schedule appt.   Bone Density status: Completed 11/24/15. Results reflect: Bone density results: OSTEOPOROSIS. Repeat every 2 years.   Additional Screening:  Hepatitis C Screening: does qualify;   Vision Screening: Recommended annual ophthalmology exams for early detection of glaucoma and other disorders of the eye. Is the patient up to date with their annual eye exam?  Yes  Who is the provider or what is the name of the office in which the patient attends annual  eye exams? Dr Octavia  If pt is not established with a provider, would they like to be referred to a provider to establish care? No .   Dental Screening: Recommended annual dental exams for proper oral hygiene   Community Resource Referral / Chronic Care Management: CRR required this visit?  No   CCM required this visit?  No     Plan:     I have personally reviewed and noted the following in the patient's chart:   Medical and social history Use of alcohol, tobacco or illicit drugs  Current medications and supplements including opioid prescriptions. Patient is currently taking opioid prescriptions. Information provided to patient regarding non-opioid alternatives. Patient advised to discuss non-opioid treatment plan with their provider. Functional ability and status Nutritional status Physical activity Advanced directives List of other physicians Hospitalizations, surgeries, and ER visits in previous 12 months Vitals Screenings to include cognitive, depression, and falls Referrals and appointments  In addition, I have reviewed and discussed  with patient certain preventive protocols, quality metrics, and best practice recommendations. A written personalized care plan for preventive services as well as general preventive health recommendations were provided to patient.     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 Requesting is there anything that you can do please advise. Pt stated unable to connect via video telephonic completed.

## 2023-06-22 ENCOUNTER — Ambulatory Visit (INDEPENDENT_AMBULATORY_CARE_PROVIDER_SITE_OTHER): Payer: Medicare Other | Admitting: Family Medicine

## 2023-06-22 ENCOUNTER — Encounter: Payer: Self-pay | Admitting: Family Medicine

## 2023-06-22 VITALS — BP 138/80 | HR 96 | Temp 97.0°F | Ht 61.0 in | Wt 117.8 lb

## 2023-06-22 DIAGNOSIS — F411 Generalized anxiety disorder: Secondary | ICD-10-CM | POA: Diagnosis not present

## 2023-06-22 DIAGNOSIS — G4700 Insomnia, unspecified: Secondary | ICD-10-CM | POA: Diagnosis not present

## 2023-06-22 MED ORDER — LORAZEPAM 1 MG PO TABS: 1.0000 mg | ORAL_TABLET | Freq: Three times a day (TID) | ORAL | 3 refills | Status: DC

## 2023-06-22 NOTE — Assessment & Plan Note (Signed)
 See anxiety A/P.  We are switching her clonazepam to lorazepam.  She can continue taking her Remeron 30 mg nightly.  She will also add back gabapentin 300 mg nightly.  She will follow-up with [place] in a few weeks.

## 2023-06-22 NOTE — Progress Notes (Signed)
   Michaela Hall is a 68 y.o. female who presents today for an office visit.  Assessment/Plan:  Chronic Problems Addressed Today: Anxiety state Symptoms are not controlled.  She is under a lot of stress related to declining health of her husband who has stage IV lung cancer.  She is working with a paramedic and this is going well.  She is currently on clonazepam  1 mg 3 times daily.  She does not think that this dose is effective any longer.  She has been on valium  the past which has worked well and is interested in switching to alternative benzodiazepine.  We did discuss trial of buspirone however she has done this before and was not effective.  She is currently since there is a multiple SSRI in the past as well.  We will switch her clonazepam  to lorazepam  1 mg 3 times daily as needed.  Also recommended that she restart gabapentin  300 mg daily.  She does not think that this was helping significantly with her neuropathic pain but thinks it may have been helping some with her mood.  She will let us  in a few weeks and we can titrate medications as needed.  If this continues to be an issue may need referral to see psychiatry.  Insomnia See anxiety A/P.  We are switching her clonazepam  to lorazepam .  She can continue taking her Remeron 30 mg nightly.  She will also add back gabapentin  300 mg nightly.  She will follow-up with us  in a few weeks.     Subjective:  HPI:  See A/P for status of chronic conditions.  Patient is here today for follow-up.  Last saw her about 2 months ago.  Her primary concern today is anxiety.  We discussed this at her last visit as well.  At that time we continued her on clonazepam  1 mg 3 times daily.  We also continued Remeron 30 mg nightly.  We discussed SSRIs which she was hesitant to start due to previous treatment failures with these.  Her anxiety has worsened significantly over the last few months.  She does not believe the clonazepam  is effective any longer.  She would  like to try alternative medication.  Since her last visit she has followed with Dr. Ramo's for her chronic pain.       Objective:  Physical Exam: BP 138/80   Pulse 96   Temp (!) 97 F (36.1 C) (Temporal)   Ht 5' 1 (1.549 m)   Wt 117 lb 12.8 oz (53.4 kg)   SpO2 97%   BMI 22.26 kg/m   Gen: No acute distress, resting comfortably Neuro: Grossly normal, moves all extremities Psych: Normal thought content.  Intermittently tearful.  No reported SI or HI.      Worth HERO. Kennyth, MD 06/22/2023 1:48 PM

## 2023-06-22 NOTE — Patient Instructions (Addendum)
 It was very nice to see you today!  We will switch your clonazepam  to lorazepam .  Please send me a message in a few weeks to let us  know how you are doing.  You can try taking a dose of gabapentin  at night as well.  Return if symptoms worsen or fail to improve.   Take care, Dr Kennyth  PLEASE NOTE:  If you had any lab tests, please let us  know if you have not heard back within a few days. You may see your results on mychart before we have a chance to review them but we will give you a call once they are reviewed by us .   If we ordered any referrals today, please let us  know if you have not heard from their office within the next week.   If you had any urgent prescriptions sent in today, please check with the pharmacy within an hour of our visit to make sure the prescription was transmitted appropriately.   Please try these tips to maintain a healthy lifestyle:  Eat at least 3 REAL meals and 1-2 snacks per day.  Aim for no more than 5 hours between eating.  If you eat breakfast, please do so within one hour of getting up.   Each meal should contain half fruits/vegetables, one quarter protein, and one quarter carbs (no bigger than a computer mouse)  Cut down on sweet beverages. This includes juice, soda, and sweet tea.   Drink at least 1 glass of water with each meal and aim for at least 8 glasses per day  Exercise at least 150 minutes every week.

## 2023-06-22 NOTE — Assessment & Plan Note (Signed)
 Symptoms are not controlled.  She is under a lot of stress related to declining health of her husband who has stage IV lung cancer.  She is working with a paramedic and this is going well.  She is currently on clonazepam  1 mg 3 times daily.  She does not think that this dose is effective any longer.  She has been on valium  the past which has worked well and is interested in switching to alternative benzodiazepine.  We did discuss trial of buspirone however she has done this before and was not effective.  She is currently since there is a multiple SSRI in the past as well.  We will switch her clonazepam  to lorazepam  1 mg 3 times daily as needed.  Also recommended that she restart gabapentin  300 mg daily.  She does not think that this was helping significantly with her neuropathic pain but thinks it may have been helping some with her mood.  She will let us  in a few weeks and we can titrate medications as needed.  If this continues to be an issue may need referral to see psychiatry.

## 2023-06-25 NOTE — Progress Notes (Signed)
 Subjective:   Michaela Hall is a 68 y.o. female who presents for Medicare Annual (Subsequent) preventive examination.  Visit Complete: Virtual I connected with  [name] on 06/25/23 by a video and audio enabled telemedicine application and verified that I am speaking with the correct person using two identifiers.  Patient Location: Home  Provider Location: Home Office  I discussed the limitations of evaluation and management by telemedicine. The patient expressed understanding and agreed to proceed.  Vital Signs: Because this visit was a virtual/telehealth visit, some criteria may be missing or patient reported. Any vitals not documented were not able to be obtained and vitals that have been documented are patient reported.      Objective:    Today's Vitals   06/20/23 1452  BP: 115/78  Pulse: 74  Weight: 114 lb (51.7 kg)   Body mass index is 21.54 kg/m.     06/20/2023    3:10 PM 04/20/2022    1:05 PM 07/23/2020    1:19 AM 09/28/2011    9:00 AM  Advanced Directives  Does Patient Have a Medical Advance Directive? No Yes No Patient does not have advance directive  Type of Customer Service Manager Power of [place];Living will    Copy of Healthcare Power of Attorney in Chart?  No - copy requested    Would patient like information on creating a medical advance directive? No - Patient declined  No - Patient declined   Pre-existing out of facility DNR order (yellow form or pink MOST form)    No    Current Medications (verified) Outpatient Encounter Medications as of 06/20/2023  Medication Sig   aspirin  EC 81 MG tablet Take 1 tablet (81 mg total) by mouth daily. Swallow whole.   cyanocobalamin (VITAMIN B12) 1000 MCG/ML injection INJECT ONCE A WEEK FOR 30 DAYS   denosumab (PROLIA) 60 MG/ML SOSY injection 60mg  Subcutaneous every 6 months   diclofenac Sodium (VOLTAREN) 1 % GEL Apply topically.   estradiol (ESTRACE) 2 MG tablet Take 2 mg by mouth daily.    levothyroxine  (SYNTHROID ) 25 MCG tablet Take 1 tablet (25 mcg total) by mouth daily.   mirtazapine (REMERON) 30 MG tablet Take 30 mg by mouth at bedtime.   naloxone  (NARCAN ) nasal spray 4 mg/0.1 mL    omeprazole -sodium bicarbonate  (ZEGERID ) 40-1100 MG capsule Take 1 capsule by mouth daily before breakfast.   ondansetron  (ZOFRAN -ODT) 8 MG disintegrating tablet Take 1 tablet by mouth 3 (three) times daily. Take 1 tab by mouth tid   Oxycodone  HCl 10 MG TABS Take 10 mg by mouth every 4 (four) hours as needed (pain).   progesterone  (PROMETRIUM ) 200 MG capsule Take 200 mg by mouth daily.   traZODone  (DESYREL ) 50 MG tablet Take 50 mg by mouth at bedtime.   Vitamin D, Ergocalciferol, (DRISDOL) 1.25 MG (50000 UNIT) CAPS capsule TAKE 1 CAPSULE BY MOUTH ONE TIME PER WEEK FOR 30 DAYS   [DISCONTINUED] clonazePAM  (KLONOPIN ) 1 MG tablet TAKE 1 TABLET BY MOUTH 3 TIMES DAILY AS NEEDED FOR ANXIETY.   Prucalopride Succinate  (MOTEGRITY ) 2 MG TABS Take 1 tablet (2 mg total) by mouth daily.   REPATHA 140 MG/ML SOSY Inject into the skin. (Patient not taking: Reported on 06/22/2023)   No facility-administered encounter medications on file as of 06/20/2023.    Allergies (verified) Gentamycin [gentamicin], Alendronate, and Statins   History: Past Medical History:  Diagnosis Date   Anxiety    Cervical spondylosis without myelopathy 10/21/2015   Cervical  spondylosis without myelopathy 10/21/2015   Chronic pain    back and right hip   Complication of anesthesia    Depression    DJD (degenerative joint disease)    Hashimoto's disease    Headache(784.0)    Loss of weight 08/30/2022   Nausea and vomiting 08/30/2022   Osteoarthritis 09/2022   bilateral hip   PONV (postoperative nausea and vomiting)    Raynaud disease    Raynaud's disease    Weakness of left arm 10/21/2015   Past Surgical History:  Procedure Laterality Date   BACK SURGERY     ESOPHAGOGASTRODUODENOSCOPY  09/28/2011   Procedure:  ESOPHAGOGASTRODUODENOSCOPY (EGD);  Surgeon: Lynwood LITTIE Celestia Mickey., MD;  Location: [facility];  Service: Endoscopy;  Laterality: N/A;   HIP SURGERY     KNEE SURGERY     SPINE SURGERY     TONSILLECTOMY     Family History  Problem Relation Age of Onset   Anesthesia problems Mother    Cancer Mother        ovary/uterus & breast   Hyperlipidemia Mother    Hypertension Mother    Subarachnoid hemorrhage Mother    Hyperlipidemia Father    Hypertension Father    Brain cancer Father    Breast cancer Maternal Aunt    Ovarian cancer Maternal Grandmother    Social History   Socioeconomic History   Marital status: Married    Spouse name: Not on file   Number of children: 3   Years of education: Not on file   Highest education level: Not on file  Occupational History   Occupation: Medicare ins. Investment Banker, Corporate: [employer]  Tobacco Use   Smoking status: Former    Current packs/day: 0.00    Average packs/day: 0.5 packs/day for 20.0 years (10.0 ttl pk-yrs)    Types: Cigarettes    Start date: 06/12/1994    Quit date: 06/12/2014    Years since quitting: 9.0   Smokeless tobacco: Never  Vaping Use   Vaping status: Some Days   Devices: does not include nicotine  Substance and Sexual Activity   Alcohol use: No    Alcohol/week: 0.0 standard drinks of alcohol   Drug use: No   Sexual activity: Not on file  Other Topics Concern   Not on file  Social History Narrative   Lives at home w/ her husband   Right-handed   Very little caffeine use, drinks 7up   Last colon screening: 09/2011   Social Drivers of Health   Financial Resource Strain: Low Risk  (06/20/2023)   Overall Financial Resource Strain (CARDIA)    Difficulty of Paying Living Expenses: Not hard at all  Food Insecurity: No Food Insecurity (06/20/2023)   Hunger Vital Sign    Worried About Running Out of Food in the Last Year: Never true    Ran Out of Food in the Last Year: Never true  Transportation Needs: No Transportation  Needs (06/20/2023)   PRAPARE - Administrator, Civil Service (Medical): No    Lack of Transportation (Non-Medical): No  Physical Activity: Inactive (06/20/2023)   Exercise Vital Sign    Days of Exercise per Week: 0 days    Minutes of Exercise per Session: 0 min  Stress: Stress Concern Present (06/20/2023)   [employer] of Occupational Health - Occupational Stress Questionnaire    Feeling of Stress : Very much  Social Connections: Moderately Integrated (06/20/2023)   Social Connection and Isolation Panel [NHANES]  Frequency of Communication with Friends and Family: Three times a week    Frequency of Social Gatherings with Friends and Family: Twice a week    Attends Religious Services: More than 4 times per year    Active Member of [employer] or Organizations: No    Attends Engineer, Structural: Never    Marital Status: Married    Tobacco Counseling Counseling given: Not Answered   Clinical Intake:  Pre-visit preparation completed: Yes  Pain : No/denies pain     BMI - recorded: 21.54 Nutritional Status: BMI of 19-24  Normal Nutritional Risks: None Diabetes: No  How often do you need to have someone help you when you read instructions, pamphlets, or other written materials from your doctor or pharmacy?: 1 - Never  Interpreter Needed?: No  Information entered by :: Ellouise Haws, LPN   Activities of Daily Living    06/20/2023    8:00 AM  In your present state of health, do you have any difficulty performing the following activities:  Hearing? 0  Vision? 0  Difficulty concentrating or making decisions? 0  Walking or climbing stairs? 0  Dressing or bathing? 0  Doing errands, shopping? 0  Preparing Food and eating ? N  Using the Toilet? N  In the past six months, have you accidently leaked urine? N  Do you have problems with loss of bowel control? N  Managing your Medications? N  Managing your Finances? N  Housekeeping or managing your  Housekeeping? N    Patient Care Team: Kennyth Worth HERO, MD as PCP - General (Family Medicine) End, Lonni, MD as PCP - Cardiology (Cardiology) Bonner Ade, MD as Consulting Physician (Physical Medicine and Rehabilitation)  Indicate any recent Medical Services you may have received from other than [facility] providers in the past year (date may be approximate).     Assessment:   This is a routine wellness examination for Kaylenn.  Hearing/Vision screen Hearing Screening - Comments:: Pt denies any hearing issues  Vision Screening - Comments:: Pt follows up with Dr Octavia for annual eye exams    Goals Addressed   None    Depression Screen    06/22/2023    1:17 PM 06/20/2023    3:07 PM 08/10/2022   10:48 AM 04/20/2022    1:01 PM 01/23/2022   11:24 AM 03/23/2021   11:31 AM 08/27/2020    3:32 PM  PHQ 2/9 Scores  PHQ - 2 Score 0 1 4 1  0 0 0  PHQ- 9 Score   15        Fall Risk    06/22/2023    1:17 PM 06/20/2023    3:10 PM 08/10/2022    9:55 AM 04/20/2022    1:06 PM 01/23/2022   11:24 AM  Fall Risk   Falls in the past year? 0 0 0 0 0  Number falls in past yr: 0 0 0 0 0  Injury with Fall? 0 0 0 0 0  Risk for fall due to : No Fall Risks Impaired mobility No Fall Risks Impaired vision;Impaired mobility   Follow up  Falls prevention discussed  Falls prevention discussed     MEDICARE RISK AT HOME: Medicare Risk at Home Any stairs in or around the home?: Yes If so, are there any without handrails?: No Home free of loose throw rugs in walkways, pet beds, electrical cords, etc?: Yes Adequate lighting in your home to reduce risk of falls?: Yes Life alert?: No Use of a  cane, walker or w/c?: No Grab bars in the bathroom?: Yes Shower chair or bench in shower?: Yes Elevated toilet seat or a handicapped toilet?: No  TIMED UP AND GO:  Was the test performed?  No    Cognitive Function:        06/20/2023    3:11 PM 04/20/2022    1:08 PM  6CIT Screen  What Year? 0 points 0 points   What month? 0 points 0 points  What time? 0 points 0 points  Count back from 20 0 points 0 points  Months in reverse 0 points 0 points  Repeat phrase 0 points 0 points  Total Score 0 points 0 points    Immunizations     Flu Vaccine status: Due, Education has been provided regarding the importance of this vaccine. Advised may receive this vaccine at local pharmacy or Health Dept. Aware to provide a copy of the vaccination record if obtained from local pharmacy or Health Dept. Verbalized acceptance and understanding.  Pneumococcal vaccine status: Due, Education has been provided regarding the importance of this vaccine. Advised may receive this vaccine at local pharmacy or Health Dept. Aware to provide a copy of the vaccination record if obtained from local pharmacy or Health Dept. Verbalized acceptance and understanding.  Covid-19 vaccine status: Information provided on how to obtain vaccines.   Qualifies for Shingles Vaccine? Yes   Zostavax completed Yes   Shingrix Completed?: Yes  Screening Tests Health Maintenance  Topic Date Due   Hepatitis C Screening  Never done   MAMMOGRAM  11/23/2017   Pneumonia Vaccine 50+ Years old (1 of 1 - PCV) 08/10/2023 (Originally 12/12/2020)   INFLUENZA VACCINE  09/10/2023 (Originally 01/11/2023)   Zoster Vaccines- Shingrix (1 of 2) 09/20/2023 (Originally 12/13/1974)   DTaP/Tdap/Td (1 - Tdap) 06/21/2024 (Originally 12/13/1974)   Medicare Annual Wellness (AWV)  06/19/2024   Colonoscopy  03/29/2026   DEXA SCAN  Completed   COVID-19 Vaccine  Completed   HPV VACCINES  Aged Out    Health Maintenance  Health Maintenance Due  Topic Date Due   Hepatitis C Screening  Never done   MAMMOGRAM  11/23/2017    Colorectal cancer screening: Type of screening: Colonoscopy. Completed 03/30/23. Repeat every 3 years  Mammogram status: Ordered 06/20/23. Pt provided with contact info and advised to call to schedule appt.   Bone Density status: Completed 11/24/15.  Results reflect: Bone density results: OSTEOPOROSIS. Repeat every 2 years.   Additional Screening:  Hepatitis C Screening: does qualify;   Vision Screening: Recommended annual ophthalmology exams for early detection of glaucoma and other disorders of the eye. Is the patient up to date with their annual eye exam?  Yes  Who is the provider or what is the name of the office in which the patient attends annual eye exams? Dr Octavia  If pt is not established with a provider, would they like to be referred to a provider to establish care? No .   Dental Screening: Recommended annual dental exams for proper oral hygiene   Community Resource Referral / Chronic Care Management: CRR required this visit?  No   CCM required this visit?  No     Plan:     I have personally reviewed and noted the following in the patient's chart:   Medical and social history Use of alcohol, tobacco or illicit drugs  Current medications and supplements including opioid prescriptions. Patient is currently taking opioid prescriptions. Information provided to patient regarding non-opioid alternatives. Patient  advised to discuss non-opioid treatment plan with their provider. Functional ability and status Nutritional status Physical activity Advanced directives List of other physicians Hospitalizations, surgeries, and ER visits in previous 12 months Vitals Screenings to include cognitive, depression, and falls Referrals and appointments  In addition, I have reviewed and discussed with patient certain preventive protocols, quality metrics, and best practice recommendations. A written personalized care plan for preventive services as well as general preventive health recommendations were provided to patient.     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 Requesting is there anything that you can do please advise. Pt stated unable to connect via video telephonic completed.

## 2023-06-29 ENCOUNTER — Telehealth (INDEPENDENT_AMBULATORY_CARE_PROVIDER_SITE_OTHER): Payer: Medicare Other | Admitting: Family Medicine

## 2023-06-29 VITALS — BP 147/91 | HR 91 | Ht 61.0 in | Wt 117.0 lb

## 2023-06-29 DIAGNOSIS — F411 Generalized anxiety disorder: Secondary | ICD-10-CM | POA: Diagnosis not present

## 2023-06-29 DIAGNOSIS — H00019 Hordeolum externum unspecified eye, unspecified eyelid: Secondary | ICD-10-CM

## 2023-06-29 MED ORDER — POLYMYXIN B-TRIMETHOPRIM 10000-0.1 UNIT/ML-% OP SOLN: 2.0000 [drp] | OPHTHALMIC | 0 refills | Status: DC

## 2023-06-29 NOTE — Assessment & Plan Note (Signed)
Symptoms still not controlled.  We did transition her from clonazepam to lorazepam however has not had any significant improvement in anxiety.  It is okay for her to take 2 mg of lorazepam 3 times daily as needed.  She will follow-up with [place] in a few weeks.  If not improving may need referral to see psychiatry.  She can also restart her gabapentin 300 mg nightly.  She has been on other medications in the past including buspirone.  This was not effective.  She has not done well with multiple SSRIs in the past either.

## 2023-06-29 NOTE — Progress Notes (Signed)
   Michaela Hall is a 68 y.o. female who presents today for a virtual office visit.  Assessment/Plan:  New/Acute Problems: Stye Discussed limitations of virtual visit and inability to perform physical exam.  Will start Polytrim drops.  Also discussed warm compresses.  We discussed reasons to return to care.  She will let us know if not improving.  Chronic Problems Addressed Today: Anxiety state Symptoms still not controlled.  We did transition her from clonazepam to lorazepam however has not had any significant improvement in anxiety.  It is okay for her to take 2 mg of lorazepam 3 times daily as needed.  She will follow-up with [place] in a few weeks.  If not improving may need referral to see psychiatry.  She can also restart her gabapentin 300 mg nightly.  She has been on other medications in the past including buspirone.  This was not effective.  She has not done well with multiple SSRIs in the past either.     Subjective:  HPI:  See Ap for status of chronic conditions. Patient here today for follow up. We switched her clonazepam to lorazepam at that time. She has not done wel with this transition.  Still having a lot of anxiety.  Does not feel like lorazepam has been effective.  She is still taking 1 mg every 6 hours as needed.  She has been under more stress recently due to declining health of her husband.  She is primary caregiver.  He is dealing with stage IV lung cancer.  Since her last visit she also developed a stye on right upper eyelid.  Started a few days ago.  She was able to lift her right eyelid up and saw a pus pocket.  She would like the area and pus started draining from the area.  Still has a lot of irritation to the area.  No fevers or chills.  No reported vision changes.         Objective/Observations  Physical Exam: Gen: NAD, resting comfortably Pulm: Normal work of breathing Psych: Normal affect and thought content  Virtual Visit via Video   I connected with  Lyna Odea Mazzola on 06/29/23 at  2:20 PM EST by a video enabled telemedicine application and verified that I am speaking with the correct person using two identifiers. The limitations of evaluation and management by telemedicine and the availability of in person appointments were discussed. The patient expressed understanding and agreed to proceed.   Patient location: Home Provider location: [place] [facility] Persons participating in the virtual visit: Myself and Patient     Katina Degree. Jimmey Ralph, MD 06/29/2023 3:04 PM

## 2023-07-09 ENCOUNTER — Telehealth: Payer: Self-pay | Admitting: Family Medicine

## 2023-07-09 NOTE — Telephone Encounter (Signed)
Clinical Call. Pt wants a new medication. Please advise  Patient Name First: Michaela Last: Hall Gender: Female DOB: 1955-07-28 Age: 68 Y 6 M 22 D Return Phone Number: [phone] (Secondary) Address: City/ State/ Zip: [place]  [address] Client [facility] - Human resources officer [facility] [employer] Provider Jacquiline Doe- MD Contact Type Call Who Is Calling Patient / Member / Family / Caregiver Call Type Triage / Clinical Relationship To Patient Self Return Phone Number [phone] (Secondary) Chief Complaint Eye Pus Or Discharge Reason for Call Symptomatic / Request for Health Information Initial Comment Caller states saw Dr Jimmey Ralph last week for sty on eye; was given abx eye drops; in bed feeling like flu all week w/fever; still has pus coming from eye and now has it in both eyes; wants a different med/ oral abx; trying to keep husband well since he has stage 4 cancer; cannot take vancamyocin; Translation No Disp. Time Lamount Cohen Time) Disposition Final User 07/07/2023 12:09:24 PM Attempt made - message left [place], RN, Shanda Bumps 07/07/2023 12:28:42 PM FINAL ATTEMPT MADE - message left Yes Wallene Dales, RN, Shanda Bumps Final Disposition 07/07/2023 12:28:42 PM FINAL ATTEMPT MADE - message left Yes [place], RN, Shanda Bumps

## 2023-07-09 NOTE — Telephone Encounter (Signed)
Spoke with patient,stated eye not doing better  Eye sty is worsen  Requesting change on antibiotic possible Z-pack  Please advise  No temperature of flu Symptoms.

## 2023-07-10 NOTE — Telephone Encounter (Signed)
Ok to send in zpack.   She should let us know if stye is still not improving and we can send to ophthalmology.  Katina Degree. Jimmey Ralph, MD 07/10/2023 12:59 PM

## 2023-07-11 ENCOUNTER — Other Ambulatory Visit: Payer: Self-pay | Admitting: *Deleted

## 2023-07-11 MED ORDER — AZITHROMYCIN 250 MG PO TABS: ORAL_TABLET | ORAL | 0 refills | Status: AC

## 2023-07-11 NOTE — Telephone Encounter (Signed)
Rx faxed to pharmacy. Patient notified.

## 2023-08-02 ENCOUNTER — Ambulatory Visit: Payer: Medicare Other | Admitting: Medical

## 2023-08-08 ENCOUNTER — Other Ambulatory Visit (HOSPITAL_COMMUNITY): Payer: Self-pay | Admitting: Endocrinology

## 2023-08-08 DIAGNOSIS — E063 Autoimmune thyroiditis: Secondary | ICD-10-CM

## 2023-08-08 DIAGNOSIS — R970 Elevated carcinoembryonic antigen [CEA]: Secondary | ICD-10-CM

## 2023-08-24 ENCOUNTER — Encounter (HOSPITAL_COMMUNITY)
Admission: RE | Admit: 2023-08-24 | Discharge: 2023-08-24 | Disposition: A | Discharge status: Home / Self Care | Source: Ambulatory Visit | Attending: Endocrinology | Admitting: Endocrinology

## 2023-08-24 DIAGNOSIS — E063 Autoimmune thyroiditis: Secondary | ICD-10-CM | POA: Insufficient documentation

## 2023-08-24 DIAGNOSIS — R970 Elevated carcinoembryonic antigen [CEA]: Secondary | ICD-10-CM | POA: Insufficient documentation

## 2023-08-24 DIAGNOSIS — I2510 Atherosclerotic heart disease of native coronary artery without angina pectoris: Secondary | ICD-10-CM | POA: Diagnosis not present

## 2023-08-24 DIAGNOSIS — I700 Atherosclerosis of aorta: Secondary | ICD-10-CM | POA: Insufficient documentation

## 2023-08-24 LAB — GLUCOSE, CAPILLARY: Glucose-Capillary: 110 mg/dL — ABNORMAL HIGH (ref 70–99)

## 2023-08-24 MED ORDER — FLUDEOXYGLUCOSE F - 18 (FDG) INJECTION
5.8300 | Freq: Once | INTRAVENOUS | Status: AC | PRN
  Administered 2023-08-24: 5.83 via INTRAVENOUS

## 2023-09-03 ENCOUNTER — Inpatient Hospital Stay: Attending: Hematology and Oncology | Admitting: Hematology and Oncology

## 2023-09-03 ENCOUNTER — Encounter: Payer: Self-pay | Admitting: Hematology and Oncology

## 2023-09-03 ENCOUNTER — Inpatient Hospital Stay

## 2023-09-03 VITALS — BP 140/86 | HR 79 | Temp 98.5°F | Resp 18 | Ht 61.0 in | Wt 122.4 lb

## 2023-09-03 DIAGNOSIS — D751 Secondary polycythemia: Secondary | ICD-10-CM | POA: Insufficient documentation

## 2023-09-03 DIAGNOSIS — F17290 Nicotine dependence, other tobacco product, uncomplicated: Secondary | ICD-10-CM | POA: Diagnosis not present

## 2023-09-03 NOTE — Progress Notes (Signed)
 [facility] CONSULT NOTE  Patient Care Team: Ardith Dark, MD as PCP - General (Family Medicine) End, Cristal Deer, MD as PCP - Cardiology (Cardiology) Sheran Luz, MD as Consulting Physician (Physical Medicine and Rehabilitation)   ASSESSMENT & PLAN Polycythemia, secondary She has intermittent erythrocytosis likely due to slight dehydration Instead of ordering labs today, I recommend she return in of the week in the afternoon after adequate food and hydration before we repeat labs She is in agreement At this point in time, she has no clinical features to suggest myeloproliferative disorder I will hold off ordering additional testing until after I see her She will continue aspirin therapy to prevent risk of thrombosis  Orders Placed This Encounter  Procedures   CBC with Differential (Cancer Center Only)    Standing Status:   Future    Expiration Date:   09/02/2024   Erythropoietin    Standing Status:   Future    Expiration Date:   09/02/2024   Artis Delay, MD   The total time spent in the appointment was 60 minutes encounter with patients including review of chart and various tests results, discussions about plan of care and coordination of care plan   All questions were answered. The patient knows to call the clinic with any problems, questions or concerns. No barriers to learning was detected.  Artis Delay, MD 3/24/202511:01 AM     CHIEF COMPLAINTS/PURPOSE OF CONSULTATION:  Erythrocytosis  HISTORY OF PRESENTING ILLNESS:  Michaela Hall 68 y.o. female is here because of elevated hemoglobin. She is here accompanied by her husband, Clide Cliff  She was found to have abnormal CBC from recent blood draw I have the opportunity to review her electronic records dated back to 2016 She has intermittent elevated hemoglobin over the last few years Most recently, on August 03, 2023, her hemoglobin was elevated at 16.8 She denies intermittent headaches, shortness of  breath on exertion, frequent leg cramps and occasional chest pain.  She never suffer from diagnosis of blood clot.  There is no prior diagnosis of obstructive sleep apnea. The patient denies weight loss or skin itching. The patient is a not a smoker.  MEDICAL HISTORY:  Past Medical History:  Diagnosis Date   Anxiety    Cervical spondylosis without myelopathy 10/21/2015   Cervical spondylosis without myelopathy 10/21/2015   Chronic pain    back and right hip   Complication of anesthesia    Depression    DJD (degenerative joint disease)    Hashimoto's disease    Headache(784.0)    Loss of weight 08/30/2022   Nausea and vomiting 08/30/2022   Osteoarthritis 09/2022   bilateral hip   PONV (postoperative nausea and vomiting)    Raynaud disease    Raynaud's disease    Weakness of left arm 10/21/2015    SURGICAL HISTORY: Past Surgical History:  Procedure Laterality Date   BACK SURGERY     ESOPHAGOGASTRODUODENOSCOPY  09/28/2011   Procedure: ESOPHAGOGASTRODUODENOSCOPY (EGD);  Surgeon: Vertell Novak., MD;  Location: [facility];  Service: Endoscopy;  Laterality: N/A;   HIP SURGERY     KNEE SURGERY     SPINE SURGERY     TONSILLECTOMY      SOCIAL HISTORY: Social History   Socioeconomic History   Marital status: Married    Spouse name: Not on file   Number of children: 3   Years of education: Not on file   Highest education level: Not on file  Occupational History  Occupation: Medicare ins. Investment banker, corporate: Advertising copywriter  Tobacco Use   Smoking status: Former    Current packs/day: 0.00    Average packs/day: 0.5 packs/day for 20.0 years (10.0 ttl pk-yrs)    Types: Cigarettes    Start date: 06/12/1994    Quit date: 06/12/2014    Years since quitting: 9.2   Smokeless tobacco: Never  Vaping Use   Vaping status: Some Days   Devices: does not include nicotine  Substance and Sexual Activity   Alcohol use: No    Alcohol/week: 0.0 standard drinks of alcohol   Drug  use: No   Sexual activity: Not on file  Other Topics Concern   Not on file  Social History Narrative   Lives at home w/ her husband   Right-handed   Very little caffeine use, drinks 7up   Last colon screening: 09/2011   Social Drivers of Health   Financial Resource Strain: Low Risk  (06/20/2023)   Overall Financial Resource Strain (CARDIA)    Difficulty of Paying Living Expenses: Not hard at all  Food Insecurity: No Food Insecurity (06/20/2023)   Hunger Vital Sign    Worried About Running Out of Food in the Last Year: Never true    Ran Out of Food in the Last Year: Never true  Transportation Needs: No Transportation Needs (06/20/2023)   PRAPARE - Administrator, Civil Service (Medical): No    Lack of Transportation (Non-Medical): No  Physical Activity: Inactive (06/20/2023)   Exercise Vital Sign    Days of Exercise per Week: 0 days    Minutes of Exercise per Session: 0 min  Stress: Stress Concern Present (06/20/2023)   [employer] of Occupational Health - Occupational Stress Questionnaire    Feeling of Stress : Very much  Social Connections: Moderately Integrated (06/20/2023)   Social Connection and Isolation Panel [NHANES]    Frequency of Communication with Friends and Family: Three times a week    Frequency of Social Gatherings with Friends and Family: Twice a week    Attends Religious Services: More than 4 times per year    Active Member of [employer] or Organizations: No    Attends Banker Meetings: Never    Marital Status: Married  Catering manager Violence: Not At Risk (06/20/2023)   Humiliation, Afraid, Rape, and Kick questionnaire    Fear of Current or Ex-Partner: No    Emotionally Abused: No    Physically Abused: No    Sexually Abused: No    FAMILY HISTORY: Family History  Problem Relation Age of Onset   Anesthesia problems Mother    Cancer Mother        ovary/uterus & breast   Hyperlipidemia Mother    Hypertension Mother    Subarachnoid  hemorrhage Mother    Hyperlipidemia Father    Hypertension Father    Brain cancer Father    Breast cancer Maternal Aunt    Ovarian cancer Maternal Grandmother     ALLERGIES:  is allergic to gentamycin [gentamicin], alendronate, and statins.  MEDICATIONS:  Current Outpatient Medications  Medication Sig Dispense Refill   Calcium Carbonate (CALCIUM 500 PO) Take 500 mg by mouth 2 (two) times daily.     aspirin EC 81 MG tablet Take 1 tablet (81 mg total) by mouth daily. Swallow whole. 90 tablet 3   cyanocobalamin (VITAMIN B12) 1000 MCG/ML injection INJECT ONCE A WEEK FOR 30 DAYS     denosumab (PROLIA) 60 MG/ML SOSY injection  60mg  Subcutaneous every 6 months     diclofenac Sodium (VOLTAREN) 1 % GEL Apply topically.     estradiol (ESTRACE) 2 MG tablet Take 2 mg by mouth daily.     levothyroxine (SYNTHROID) 25 MCG tablet Take 1 tablet (25 mcg total) by mouth daily. 90 tablet 0   LORazepam (ATIVAN) 1 MG tablet Take 1 tablet (1 mg total) by mouth every 8 (eight) hours. 90 tablet 3   mirtazapine (REMERON) 30 MG tablet Take 30 mg by mouth at bedtime.     naloxone (NARCAN) nasal spray 4 mg/0.1 mL      omeprazole-sodium bicarbonate (ZEGERID) 40-1100 MG capsule Take 1 capsule by mouth daily before breakfast.     ondansetron (ZOFRAN-ODT) 8 MG disintegrating tablet Take 1 tablet by mouth 3 (three) times daily. Take 1 tab by mouth tid     Oxycodone HCl 10 MG TABS Take 10 mg by mouth every 4 (four) hours as needed (pain).     progesterone (PROMETRIUM) 200 MG capsule Take 200 mg by mouth daily.     traZODone (DESYREL) 50 MG tablet Take 50 mg by mouth at bedtime.     Vitamin D, Ergocalciferol, (DRISDOL) 1.25 MG (50000 UNIT) CAPS capsule TAKE 1 CAPSULE BY MOUTH ONE TIME PER WEEK FOR 30 DAYS     No current facility-administered medications for this visit.    REVIEW OF SYSTEMS:   Constitutional: Denies fevers, chills or abnormal night sweats Eyes: Denies blurriness of vision, double vision or watery  eyes Ears, nose, mouth, throat, and face: Denies mucositis or sore throat Respiratory: Denies cough, dyspnea or wheezes Cardiovascular: Denies palpitation, chest discomfort or lower extremity swelling Gastrointestinal:  Denies nausea, heartburn or change in bowel habits Skin: Denies abnormal skin rashes Lymphatics: Denies new lymphadenopathy or easy bruising Neurological:Denies numbness, tingling or new weaknesses Behavioral/Psych: Mood is stable, no new changes  All other systems were reviewed with the patient and are negative.  PHYSICAL EXAMINATION: ECOG PERFORMANCE STATUS: 0 - Asymptomatic  Vitals:   09/03/23 0958  BP: (!) 140/86  Pulse: 79  Resp: 18  Temp: 98.5 F (36.9 C)  SpO2: 96%   Filed Weights   09/03/23 0958  Weight: 122 lb 6.4 oz (55.5 kg)    GENERAL:alert, no distress and comfortable SKIN: skin color, texture, turgor are normal, no rashes or significant lesions EYES: normal, conjunctiva are pink and non-injected, sclera clear OROPHARYNX:no exudate, no erythema and lips, buccal mucosa, and tongue normal  NECK: supple, thyroid normal size, non-tender, without nodularity LYMPH:  no palpable lymphadenopathy in the cervical, axillary or inguinal LUNGS: clear to auscultation and percussion with normal breathing effort HEART: regular rate & rhythm and no murmurs and no lower extremity edema ABDOMEN:abdomen soft, non-tender and normal bowel sounds Musculoskeletal:no cyanosis of digits and no clubbing  PSYCH: alert & oriented x 3 with fluent speech NEURO: no focal motor/sensory deficits  LABORATORY DATA:  I have reviewed the data as listed Recent Results (from the past 2160 hours)  Glucose, capillary     Status: Abnormal   Collection Time: 08/24/23  2:50 PM  Result Value Ref Range   Glucose-Capillary 110 (H) 70 - 99 mg/dL    Comment: Glucose reference range applies only to samples taken after fasting for at least 8 hours.

## 2023-09-03 NOTE — Assessment & Plan Note (Addendum)
 She has intermittent erythrocytosis likely due to slight dehydration Instead of ordering labs today, I recommend she return in of the week in the afternoon after adequate food and hydration before we repeat labs She is in agreement At this point in time, she has no clinical features to suggest myeloproliferative disorder I will hold off ordering additional testing until after I see her She will continue aspirin therapy to prevent risk of thrombosis

## 2023-09-07 ENCOUNTER — Inpatient Hospital Stay (HOSPITAL_BASED_OUTPATIENT_CLINIC_OR_DEPARTMENT_OTHER): Admitting: Hematology and Oncology

## 2023-09-07 ENCOUNTER — Inpatient Hospital Stay

## 2023-09-07 VITALS — BP 153/90 | HR 91 | Temp 98.2°F | Resp 18 | Ht 61.0 in | Wt 123.8 lb

## 2023-09-07 VITALS — BP 161/90 | HR 100 | Resp 18

## 2023-09-07 DIAGNOSIS — D751 Secondary polycythemia: Secondary | ICD-10-CM

## 2023-09-07 LAB — CBC WITH DIFFERENTIAL (CANCER CENTER ONLY)
Abs Immature Granulocytes: 0.01 10*3/uL (ref 0.00–0.07)
Basophils Absolute: 0.1 10*3/uL (ref 0.0–0.1)
Basophils Relative: 1 %
Eosinophils Absolute: 0.1 10*3/uL (ref 0.0–0.5)
Eosinophils Relative: 2 %
HCT: 50.9 % — ABNORMAL HIGH (ref 36.0–46.0)
Hemoglobin: 16.5 g/dL — ABNORMAL HIGH (ref 12.0–15.0)
Immature Granulocytes: 0 %
Lymphocytes Relative: 25 %
Lymphs Abs: 1.7 10*3/uL (ref 0.7–4.0)
MCH: 30.9 pg (ref 26.0–34.0)
MCHC: 32.4 g/dL (ref 30.0–36.0)
MCV: 95.3 fL (ref 80.0–100.0)
Monocytes Absolute: 0.5 10*3/uL (ref 0.1–1.0)
Monocytes Relative: 7 %
Neutro Abs: 4.4 10*3/uL (ref 1.7–7.7)
Neutrophils Relative %: 65 %
Platelet Count: 203 10*3/uL (ref 150–400)
RBC: 5.34 MIL/uL — ABNORMAL HIGH (ref 3.87–5.11)
RDW: 13.1 % (ref 11.5–15.5)
WBC Count: 6.7 10*3/uL (ref 4.0–10.5)
nRBC: 0 % (ref 0.0–0.2)

## 2023-09-07 NOTE — Progress Notes (Signed)
 [facility] OFFICE PROGRESS NOTE  Patient Care Team: Ardith Dark, MD as PCP - General (Family Medicine) End, Cristal Deer, MD as PCP - Cardiology (Cardiology) Sheran Luz, MD as Consulting Physician (Physical Medicine and Rehabilitation)  Assessment & Plan Polycythemia, secondary She has intermittent erythrocytosis  Despite adequate oral fluid hydration, she is still noted to have elevated hemoglobin and hematocrit Serum erythropoietin level is still pending She could be at risk of thrombosis I recommend 1 unit of blood removed through phlebotomy today to reduce her risk of thrombosis and she agreed I will call her with test results next week If serum erythropoietin level is normal, I will have to order additional test to rule out myeloproliferative disorder She agreed with the plan of care She will continue aspirin therapy to prevent risk of thrombosis  No orders of the defined types were placed in this encounter.    Artis Delay, MD  INTERVAL HISTORY: she returns for further evaluation for polycythemia Please see notes dated 09/03/2023 for further details She drink plenty of water this morning prior to lab draw just now I reviewed CBC result with her and we discussed risk and benefits of phlebotomy  PHYSICAL EXAMINATION: ECOG PERFORMANCE STATUS: 0 - Asymptomatic  Vitals:   09/07/23 1313  BP: (!) 153/90  Pulse: 91  Resp: 18  Temp: 98.2 F (36.8 C)  SpO2: 100%   Filed Weights   09/07/23 1313  Weight: 123 lb 12.8 oz (56.2 kg)    Relevant data reviewed during this visit included CBC

## 2023-09-07 NOTE — Assessment & Plan Note (Addendum)
 She has intermittent erythrocytosis  Despite adequate oral fluid hydration, she is still noted to have elevated hemoglobin and hematocrit Serum erythropoietin level is still pending She could be at risk of thrombosis I recommend 1 unit of blood removed through phlebotomy today to reduce her risk of thrombosis and she agreed I will call her with test results next week If serum erythropoietin level is normal, I will have to order additional test to rule out myeloproliferative disorder She agreed with the plan of care She will continue aspirin therapy to prevent risk of thrombosis

## 2023-09-07 NOTE — Progress Notes (Addendum)
 Michaela Hall presents today for phlebotomy per MD orders. Phlebotomy procedure started at 1353 and ended at 1410. 457 grams removed. Patient observed for 10 minutes after procedure without any incident. Patient declined to stay for full 30 minute observation.  Patient tolerated procedure well. 18 G R forearm. IV needle removed intact.

## 2023-09-07 NOTE — Patient Instructions (Signed)

## 2023-09-08 LAB — ERYTHROPOIETIN: Erythropoietin: 4.7 m[IU]/mL (ref 2.6–18.5)

## 2023-09-11 ENCOUNTER — Telehealth: Payer: Self-pay

## 2023-09-11 ENCOUNTER — Other Ambulatory Visit: Payer: Self-pay | Admitting: Hematology and Oncology

## 2023-09-11 DIAGNOSIS — D751 Secondary polycythemia: Secondary | ICD-10-CM

## 2023-09-11 NOTE — Telephone Encounter (Signed)
-----   Message from Artis Delay sent at 09/11/2023 10:29 AM EDT ----- Tell her the test I was waiting for, erythropoietin is not helpful I recommend NGS, another lab test to be done Please scehdule the lab appt and see me 2 weeks later to review results, I will place order

## 2023-09-11 NOTE — Telephone Encounter (Signed)
 She is coming in tomorrow for labs and will see you 4/24. That is your only availability.

## 2023-09-11 NOTE — Telephone Encounter (Signed)
 Attempted to call pt to advise labs per MD. LVM for call back to further discuss.

## 2023-09-12 ENCOUNTER — Inpatient Hospital Stay: Attending: Hematology and Oncology

## 2023-09-12 DIAGNOSIS — Z7982 Long term (current) use of aspirin: Secondary | ICD-10-CM | POA: Insufficient documentation

## 2023-09-12 DIAGNOSIS — D751 Secondary polycythemia: Secondary | ICD-10-CM | POA: Insufficient documentation

## 2023-09-12 LAB — URIC ACID: Uric Acid, Serum: 3.5 mg/dL (ref 2.5–7.1)

## 2023-09-12 LAB — CMP (CANCER CENTER ONLY)
ALT: 11 U/L (ref 0–44)
AST: 19 U/L (ref 15–41)
Albumin: 4.8 g/dL (ref 3.5–5.0)
Alkaline Phosphatase: 63 U/L (ref 38–126)
Anion gap: 6 (ref 5–15)
BUN: 19 mg/dL (ref 8–23)
CO2: 30 mmol/L (ref 22–32)
Calcium: 9.8 mg/dL (ref 8.9–10.3)
Chloride: 103 mmol/L (ref 98–111)
Creatinine: 0.97 mg/dL (ref 0.44–1.00)
GFR, Estimated: >60 mL/min (ref >60)
Glucose, Bld: 90 mg/dL (ref 70–99)
Potassium: 4.4 mmol/L (ref 3.5–5.1)
Sodium: 139 mmol/L (ref 135–145)
Total Bilirubin: 0.4 mg/dL (ref 0.0–1.2)
Total Protein: 7.7 g/dL (ref 6.5–8.1)

## 2023-09-12 LAB — CBC WITH DIFFERENTIAL (CANCER CENTER ONLY)
Abs Immature Granulocytes: 0.05 10*3/uL (ref 0.00–0.07)
Basophils Absolute: 0.1 10*3/uL (ref 0.0–0.1)
Basophils Relative: 1 %
Eosinophils Absolute: 0.1 10*3/uL (ref 0.0–0.5)
Eosinophils Relative: 1 %
HCT: 47.5 % — ABNORMAL HIGH (ref 36.0–46.0)
Hemoglobin: 15.3 g/dL — ABNORMAL HIGH (ref 12.0–15.0)
Immature Granulocytes: 1 %
Lymphocytes Relative: 29 %
Lymphs Abs: 2.4 10*3/uL (ref 0.7–4.0)
MCH: 30.5 pg (ref 26.0–34.0)
MCHC: 32.2 g/dL (ref 30.0–36.0)
MCV: 94.8 fL (ref 80.0–100.0)
Monocytes Absolute: 0.6 10*3/uL (ref 0.1–1.0)
Monocytes Relative: 7 %
Neutro Abs: 5.1 10*3/uL (ref 1.7–7.7)
Neutrophils Relative %: 61 %
Platelet Count: 211 10*3/uL (ref 150–400)
RBC: 5.01 MIL/uL (ref 3.87–5.11)
RDW: 13.3 % (ref 11.5–15.5)
WBC Count: 8.3 10*3/uL (ref 4.0–10.5)
nRBC: 0 % (ref 0.0–0.2)

## 2023-09-13 ENCOUNTER — Telehealth: Payer: Self-pay

## 2023-09-13 ENCOUNTER — Other Ambulatory Visit: Payer: Self-pay

## 2023-09-13 DIAGNOSIS — D751 Secondary polycythemia: Secondary | ICD-10-CM

## 2023-09-13 NOTE — Telephone Encounter (Signed)
 Called and left a message to call the office back. She needs appt to come back and get lab for NGS JAK.

## 2023-09-14 ENCOUNTER — Inpatient Hospital Stay

## 2023-09-14 ENCOUNTER — Other Ambulatory Visit: Payer: Self-pay | Admitting: Hematology and Oncology

## 2023-09-14 ENCOUNTER — Encounter: Payer: Self-pay | Admitting: Hematology and Oncology

## 2023-09-14 DIAGNOSIS — D751 Secondary polycythemia: Secondary | ICD-10-CM

## 2023-09-14 NOTE — Telephone Encounter (Signed)
 Called back and scheduled lab at 4 pm. She is aware of appt.

## 2023-09-17 LAB — MPN/CML FISH

## 2023-09-21 LAB — MISC LABCORP TEST (SEND OUT): Labcorp test code: 489615

## 2023-09-26 ENCOUNTER — Emergency Department (HOSPITAL_COMMUNITY)

## 2023-09-26 ENCOUNTER — Other Ambulatory Visit: Payer: Self-pay

## 2023-09-26 ENCOUNTER — Emergency Department (HOSPITAL_COMMUNITY)
Admission: EM | Admit: 2023-09-26 | Discharge: 2023-09-27 | Disposition: A | Discharge status: Home / Self Care | Attending: Emergency Medicine | Admitting: Emergency Medicine

## 2023-09-26 ENCOUNTER — Encounter (HOSPITAL_COMMUNITY): Payer: Self-pay | Admitting: Emergency Medicine

## 2023-09-26 DIAGNOSIS — R0602 Shortness of breath: Secondary | ICD-10-CM | POA: Diagnosis not present

## 2023-09-26 DIAGNOSIS — R0789 Other chest pain: Secondary | ICD-10-CM | POA: Diagnosis present

## 2023-09-26 DIAGNOSIS — R112 Nausea with vomiting, unspecified: Secondary | ICD-10-CM | POA: Diagnosis not present

## 2023-09-26 DIAGNOSIS — R079 Chest pain, unspecified: Secondary | ICD-10-CM

## 2023-09-26 DIAGNOSIS — I2510 Atherosclerotic heart disease of native coronary artery without angina pectoris: Secondary | ICD-10-CM | POA: Insufficient documentation

## 2023-09-26 DIAGNOSIS — Z7982 Long term (current) use of aspirin: Secondary | ICD-10-CM | POA: Diagnosis not present

## 2023-09-26 LAB — BASIC METABOLIC PANEL WITH GFR
Anion gap: 10 (ref 5–15)
BUN: 13 mg/dL (ref 8–23)
CO2: 23 mmol/L (ref 22–32)
Calcium: 9.2 mg/dL (ref 8.9–10.3)
Chloride: 104 mmol/L (ref 98–111)
Creatinine, Ser: 0.92 mg/dL (ref 0.44–1.00)
GFR, Estimated: >60 mL/min (ref >60)
Glucose, Bld: 92 mg/dL (ref 70–99)
Potassium: 3.7 mmol/L (ref 3.5–5.1)
Sodium: 137 mmol/L (ref 135–145)

## 2023-09-26 LAB — CBC
HCT: 43.4 % (ref 36.0–46.0)
Hemoglobin: 14.4 g/dL (ref 12.0–15.0)
MCH: 31 pg (ref 26.0–34.0)
MCHC: 33.2 g/dL (ref 30.0–36.0)
MCV: 93.5 fL (ref 80.0–100.0)
Platelets: 243 10*3/uL (ref 150–400)
RBC: 4.64 MIL/uL (ref 3.87–5.11)
RDW: 13.3 % (ref 11.5–15.5)
WBC: 8.1 10*3/uL (ref 4.0–10.5)
nRBC: 0 % (ref 0.0–0.2)

## 2023-09-26 LAB — I-STAT CHEM 8, ED
BUN: 15 mg/dL (ref 8–23)
Calcium, Ion: 1.16 mmol/L (ref 1.15–1.40)
Chloride: 104 mmol/L (ref 98–111)
Creatinine, Ser: 0.9 mg/dL (ref 0.44–1.00)
Glucose, Bld: 88 mg/dL (ref 70–99)
HCT: 45 % (ref 36.0–46.0)
Hemoglobin: 15.3 g/dL — ABNORMAL HIGH (ref 12.0–15.0)
Potassium: 3.7 mmol/L (ref 3.5–5.1)
Sodium: 139 mmol/L (ref 135–145)
TCO2: 24 mmol/L (ref 22–32)

## 2023-09-26 LAB — TROPONIN I (HIGH SENSITIVITY): Troponin I (High Sensitivity): 5 ng/L (ref <18)

## 2023-09-26 MED ORDER — IOHEXOL 350 MG/ML SOLN
75.0000 mL | Freq: Once | INTRAVENOUS | Status: AC | PRN
  Administered 2023-09-26: 75 mL via INTRAVENOUS

## 2023-09-26 NOTE — ED Triage Notes (Signed)
  Patient comes in with chest pain and SOB that started about 2 hours ago.  Patient states she has been having hypertension for the last week with no previous hx.  Was recently diagnosed with polycythemia.  States pain radiates to L shoulder, endorses nausea with no vomiting.  States she was diaphoretic.  Pain 6/10, pressure.

## 2023-09-26 NOTE — ED Notes (Signed)
 Patient transported to CT

## 2023-09-26 NOTE — ED Provider Triage Note (Signed)
 Emergency Medicine Provider Triage Evaluation Note  ADWOA AXE , a 68 y.o. female  was evaluated in triage.  Pt complains of chest pain that started two hours prior to arrival.  Radiates to the left shoulder.  Reports he is having some shortness of breath as well.  Describes it as pressure in nature.  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  No blood thinner use  Review of Systems  Positive:  Negative:   Physical Exam  BP (!) 164/99 (BP Location: Left Arm)   Pulse 86   Temp 98.3 F (36.8 C) (Oral)   Resp 20   Ht 5\' 1"  (1.549 m)   Wt 55.8 kg   SpO2 98%   BMI 23.24 kg/m  Gen:   Awake, no distress   Resp:  Normal effort  MSK:   Moves extremities without difficulty  Other:  Palpable distal pulses  Medical Decision Making  Medically screening exam initiated at 8:59 PM.  Appropriate orders placed.  Roquel Burgin Dearmas was informed that the remainder of the evaluation will be completed by another provider, this initial triage assessment does not replace that evaluation, and the importance of remaining in the ED until their evaluation is complete.  Chest pain labs ordered.  Concern for PE, CT angio ordered   Spence Dux, Kirby Peoples 09/26/23 2101

## 2023-09-26 NOTE — ED Provider Notes (Signed)
 [place] [facility] Provider Note   CSN: [id number] Arrival date & time: 09/26/23  2039     History  Chief Complaint  Patient presents with   Chest Pain   Shortness of Breath    Michaela Hall is a 68 y.o. female with PMH as listed below who presents with chest pain that started two hours prior to arrival.  Radiates to the left shoulder.  Reports she is having some shortness of breath and nausea/vomiting as well.  Describes it as pressure.  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  No blood thinner use. She feels improved now and the pain has subsided though it was severe earlier. Denies any leg swelling, recent travel/hospitalizations/surgeries, no h/o DVT/PE. No f/c, cough, or orthopnea. Was seen by cardiology in the past for exertional SOB and had mild nonobstructive CAD and PACs/PVCs on monitoring.    Past Medical History:  Diagnosis Date   Anxiety    Cervical spondylosis without myelopathy 10/21/2015   Cervical spondylosis without myelopathy 10/21/2015   Chronic pain    back and right hip   Complication of anesthesia    Depression    DJD (degenerative joint disease)    Hashimoto's disease    Headache(784.0)    Loss of weight 08/30/2022   Nausea and vomiting 08/30/2022   Osteoarthritis 09/2022   bilateral hip   PONV (postoperative nausea and vomiting)    Raynaud disease    Raynaud's disease    Weakness of left arm 10/21/2015       Home Medications Prior to Admission medications   Medication Sig Start Date End Date Taking? Authorizing Provider  aspirin  EC 81 MG tablet Take 1 tablet (81 mg total) by mouth daily. Swallow whole. 05/03/21   Roark Chick, PA-C  Calcium  Carbonate (CALCIUM  500 PO) Take 500 mg by mouth 2 (two) times daily.    [provider]  cyanocobalamin (VITAMIN B12) 1000 MCG/ML injection INJECT ONCE A WEEK FOR 30 DAYS    [provider]  denosumab (PROLIA) 60 MG/ML SOSY injection  60mg  Subcutaneous every 6 months 06/16/21   [provider]  diclofenac Sodium (VOLTAREN) 1 % GEL Apply topically. 01/08/23   [provider]  estradiol (ESTRACE) 2 MG tablet Take 2 mg by mouth daily. 11/14/22   [provider]  levothyroxine  (SYNTHROID ) 25 MCG tablet Take 1 tablet (25 mcg total) by mouth daily. 03/29/21   Rodney Clamp, MD  LORazepam  (ATIVAN ) 1 MG tablet Take 1 tablet (1 mg total) by mouth every 8 (eight) hours. 06/22/23   Rodney Clamp, MD  mirtazapine (REMERON) 30 MG tablet Take 30 mg by mouth at bedtime.    [provider]  naloxone [facility]) nasal spray 4 mg/0.1 mL     [provider]  omeprazole -sodium bicarbonate  (ZEGERID ) 40-1100 MG capsule Take 1 capsule by mouth daily before breakfast. 12/29/22   Armbruster, Lendon Queen, MD  ondansetron  (ZOFRAN -ODT) 8 MG disintegrating tablet Take 1 tablet by mouth 3 (three) times daily. Take 1 tab by mouth tid 08/03/15   [provider]  Oxycodone HCl 10 MG TABS Take 10 mg by mouth every 4 (four) hours as needed (pain). 08/26/20   [provider]  progesterone (PROMETRIUM) 200 MG capsule Take 200 mg by mouth daily.    [provider]  traZODone (DESYREL) 50 MG tablet Take 50 mg by mouth at bedtime.    [provider]  Vitamin D, Ergocalciferol, (  DRISDOL) 1.25 MG (50000 UNIT) CAPS capsule TAKE 1 CAPSULE BY MOUTH ONE TIME PER WEEK FOR 30 DAYS    [provider]      Allergies    Gentamycin [gentamicin], Alendronate, and Statins    Review of Systems   Review of Systems A 10 point review of systems was performed and is negative unless otherwise reported in HPI.  Physical Exam Updated Vital Signs BP (!) 148/88   Pulse 70   Temp 98.3 F (36.8 C) (Oral)   Resp 13   Ht 5\' 1"  (1.549 m)   Wt 55.8 kg   SpO2 100%   BMI 23.24 kg/m  Physical Exam General: Normal appearing female, lying in bed.  HEENT: Sclera anicteric, MMM, trachea midline.  Cardiology:  RRR, no murmurs/rubs/gallops. BL radial and DP pulses equal bilaterally.  Resp: Normal respiratory rate and effort. CTAB, no wheezes, rhonchi, crackles.  Abd: Soft, non-tender, non-distended. No rebound tenderness or guarding.  GU: Deferred. MSK: No peripheral edema or signs of trauma. Extremities without deformity or TTP. No cyanosis or clubbing. Skin: warm, dry.  Neuro: A&Ox4, CNs II-XII grossly intact. MAEs. Sensation grossly intact.  Psych: Normal mood and affect.   ED Results / Procedures / Treatments   Labs (all labs ordered are listed, but only abnormal results are displayed) Labs Reviewed  I-STAT CHEM 8, ED - Abnormal; Notable for the following components:      Result Value   Hemoglobin 15.3 (*)    All other components within normal limits  BASIC METABOLIC PANEL WITH GFR  CBC  TROPONIN I (HIGH SENSITIVITY)  TROPONIN I (HIGH SENSITIVITY)    EKG EKG Interpretation Date/Time:  Wednesday September 26 2023 20:48:53 EDT Ventricular Rate:  85 PR Interval:  156 QRS Duration:  70 QT Interval:  380 QTC Calculation: 452 R Axis:   89  Text Interpretation: Normal sinus rhythm Normal ECG Confirmed by Annita Kindle [phone]) on 09/26/2023 9:33:31 PM  Radiology CXR: Mild bandlike opacity left lung base, likely atelectasis.    Procedures Procedures    Medications Ordered in ED Medications  iohexol  (OMNIPAQUE ) 350 MG/ML injection 75 mL (75 mLs Intravenous Contrast Given 09/26/23 2225)    ED Course/ Medical Decision Making/ A&P                          Medical Decision Making Amount and/or Complexity of Data Reviewed Labs: ordered. Decision-making details documented in ED Course. Radiology: ordered. Decision-making details documented in ED Course.    This patient presents to the ED for concern of chest pain/SOB, this involves an extensive number of treatment options, and is a complaint that carries with it a high risk of complications and morbidity.  I considered the following  differential and admission for this acute, potentially life threatening condition. Overall patient is HDS and well-appearing.  MDM:    DDX for chest pain includes but is not limited to:  EKG is nonischemic, troponin ruled out, lower c/f ACS today. Could be describing an anginal equivalent, but this does seem chronic for the patient and she has no active chest pain. Very low suspicion for aortic dissection given presenting sx. Patient cannot PERC out based on age, but will obtain CT PE.  Clinical Course as of 10/03/23 0720  Wed Sep 26, 2023  2316 DG Chest 2 View Mild bandlike opacity left lung base, likely atelectasis. [HN]  2316 CBC, BMP, unremarkable [HN]  2316 Troponin I (High Sensitivity): 5 neg [  HN]  2328 CT Angio Chest PE W and/or Wo Contrast 1. No evidence of pulmonary embolism. 2. No acute intrathoracic pathology identified. 3. Mild coronary artery calcification   [HN]    Clinical Course User Index [HN] Merdis Stalling, MD    Labs: I Ordered, and personally interpreted labs.  The pertinent results include:  those listed above  Imaging Studies ordered: I ordered imaging studies including CXR, CT PE I independently visualized and interpreted imaging. I agree with the radiologist interpretation  Additional history obtained from chart review, husband at bedside.    Cardiac Monitoring: The patient was maintained on a cardiac monitor.  I personally viewed and interpreted the cardiac monitored which showed an underlying rhythm of: NSR  Reevaluation: After the interventions noted above, I reevaluated the patient and found that they have :improved  Social Determinants of Health: Lives independently  Disposition:  Patient with very reassuring workup here today, no active chest pain. Will refer patient to her cardiologist in the clinic, instructed to call in the morning to set up a follow-up appointment.  Instructed to take tylenol  for chest pain. DC w/ discharge  instructions/return precautions. All questions answered to patient's satisfaction.    Co morbidities that complicate the patient evaluation  Past Medical History:  Diagnosis Date   Anxiety    Cervical spondylosis without myelopathy 10/21/2015   Cervical spondylosis without myelopathy 10/21/2015   Chronic pain    back and right hip   Complication of anesthesia    Depression    DJD (degenerative joint disease)    Hashimoto's disease    Headache(784.0)    Loss of weight 08/30/2022   Nausea and vomiting 08/30/2022   Osteoarthritis 09/2022   bilateral hip   PONV (postoperative nausea and vomiting)    Raynaud disease    Raynaud's disease    Weakness of left arm 10/21/2015     Medicines No orders of the defined types were placed in this encounter.   I have reviewed the patients home medicines and have made adjustments as needed  Problem List / ED Course: Problem List Items Addressed This Visit   None Visit Diagnoses       Chest pain, unspecified type    -  Primary                   This note was created using dictation software, which may contain spelling or grammatical errors.    Merdis Stalling, MD 10/03/23 [phone]

## 2023-09-27 ENCOUNTER — Telehealth: Payer: Self-pay

## 2023-09-27 LAB — TROPONIN I (HIGH SENSITIVITY): Troponin I (High Sensitivity): 4 ng/L (ref <18)

## 2023-09-27 NOTE — Telephone Encounter (Signed)
 Returned her call. She went to the ER last night for chest pressure and bp 200/100. She is feeling better today. She is scheduled with cardiology on 4/22 and Dr. Marton Sleeper on 4/24. Told her I would forward her message to Dr. Marton Sleeper.

## 2023-10-01 NOTE — Progress Notes (Signed)
Cardiology Office Note    Date:  10/02/2023   ID:  Michaela Hall 23-Feb-1956, MRN [id number]  PCP:  Rodney Clamp, MD  Cardiologist:  Sammy Crisp, MD  Electrophysiologist:  None   Chief Complaint: ED follow-up  History of Present Illness:   Michaela Hall is a 68 y.o. female with history of coronary artery calcification, orthostatic hypotension with presyncope/syncope, minimal bilateral carotid artery stenosis, recently diagnosed with polycythemia, gastroparesis with chronic constipation and chronic narcotic use, lumbar postlaminectomy syndrome with chronic pain, degenerative disc disease, Raynaud's phenomenon, osteoarthritis, anxiety, and depression who presents for ED follow-up of chest pain.  She was previously evaluated by Dr. Loetta Ringer in 2017 for shortness of breath after having been essentially bedridden for 7 months, along with occasional chest discomfort.  Lexiscan  MPI at that time was low risk without evidence of ischemia or scar.  Echo demonstrated a preserved LV systolic function with grade 2 diastolic dysfunction.  She established care with Dr. Nolan Battle in 02/2021 for further evaluation of approximately 6 episodes of passing out over the prior 6 months with multiple episodes of lightheadedness and presyncope.  In most cases, she reported these episodes occur when she wakes up at night and reported she is still lying down when she would begin to feel lightheaded.  There were no obvious precipitants that she could identify.  Physical exam and EKG, including QT interval, were normal.  Outpatient cardiac monitoring demonstrated a predominant rhythm of sinus with an average rate of 80 bpm (range 50 to 141 bpm), rare PACs and PVCs, single atrial run lasting 4 beats was noted with a maximal rate of 126 bpm, no sustained arrhythmias or prolonged pauses were observed.  A single patient triggered event corresponded to sinus rhythm.  Echo on 04/08/2021, demonstrated an EF of 60 to 65%, no  regional wall motion abnormalities grade 1 diastolic dysfunction, normal global longitudinal strain, normal RV systolic function and ventricular cavity size, no significant valvular abnormalities, and an estimated right atrial pressure of 3 mmHg.  Carotid artery ultrasound on 10/28 showed bilateral 1 to 39% ICA stenoses with antegrade flow of the bilateral vertebral arteries and normal flow hemodynamics of the bilateral subclavian arteries.  Coronary CTA in 04/2021 showed a calcium  score of 31.9 which was the 69th percentile.  There was mild nonobstructive CAD with 25% proximal LAD stenosis  She was seen in 12/2022 for preprocedure cardiac risk stratification for EGD and colonoscopy.  She reported occasional blood pressure drops with associated presyncope.  She was orthostatic.  Echo in 01/2023 showed an EF of 60 to 65%, no regional wall motion normalities, grade 1 diastolic dysfunction, normal RV systolic function and ventricular cavity size, and an estimated right atrial pressure of 3 mmHg.  Carotid artery ultrasound in 01/2023 showed 1 to 39% bilateral ICA stenoses with antegrade flow of the bilateral vertebral arteries and normal flow hemodynamics in the bilateral subclavian arteries.  Zio patch showed the patient was monitored for only 2 days with a predominant rhythm of sinus with an average rate of 80 bpm with rare PACs and PVCs, 2 episodes of SVT lasting up to 5 beats, no sustained arrhythmias or prolonged pauses, and no patient triggered events.  In follow-up in 02/2023, she continued to report not feeling well and was with generalized fatigue.  She was seen in the ED on 09/26/2023 with chest pain that started 2 hours prior to arrival and radiated to the left shoulder with some associated shortness of breath,  diaphoresis, nausea, and vomiting.  Blood pressure in triage 164/99.  High-sensitivity troponin negative x 2.  EKG showed sinus rhythm with no acute ischemic changes.  Chest x-ray showed likely  atelectasis along the left lung base.  CTA chest was negative for PE with mild coronary artery calcification noted.  She comes in accompanied by her husband today.  She reports a longstanding history of intermittent randomly occurring chest discomfort and an episode of chest pressure on 4/16 that occurred at rest prompting her ED visit as outlined above.  Since her ER visit she has continued to have intermittent randomly occurring chest discomfort that is longstanding.  No symptoms as severe as what she experienced earlier this month.  No falls or symptoms concerning for bleeding.  No lower extremity swelling or progressive orthopnea.  She does report that she feels anxious at times.   Labs independently reviewed: 09/2023 - TSH 9.412, Hgb 14.4, PLT 243, potassium 3.7, BUN 13, serum creatinine 0.92, albumin 4.8, AST/ALT normal 04/2021 - TC 228, TG 105, HDL 65, LDL 145  Past Medical History:  Diagnosis Date   Anxiety    Cervical spondylosis without myelopathy 10/21/2015   Cervical spondylosis without myelopathy 10/21/2015   Chronic pain    back and right hip   Complication of anesthesia    Depression    DJD (degenerative joint disease)    Hashimoto's disease    Headache(784.0)    Loss of weight 08/30/2022   Nausea and vomiting 08/30/2022   Osteoarthritis 09/2022   bilateral hip   PONV (postoperative nausea and vomiting)    Raynaud disease    Raynaud's disease    Weakness of left arm 10/21/2015    Past Surgical History:  Procedure Laterality Date   BACK SURGERY     ESOPHAGOGASTRODUODENOSCOPY  09/28/2011   Procedure: ESOPHAGOGASTRODUODENOSCOPY (EGD);  Surgeon: Venson Ginger., MD;  Location: [facility];  Service: Endoscopy;  Laterality: N/A;   HIP SURGERY     KNEE SURGERY     SPINE SURGERY     TONSILLECTOMY      Current Medications: Current Meds  Medication Sig   aspirin  EC 81 MG tablet Take 1 tablet (81 mg total) by mouth daily. Swallow whole.   Calcium  Carbonate  (CALCIUM  500 PO) Take 500 mg by mouth 2 (two) times daily.   cyanocobalamin (VITAMIN B12) 1000 MCG/ML injection INJECT ONCE A WEEK FOR 30 DAYS   denosumab (PROLIA) 60 MG/ML SOSY injection 60mg  Subcutaneous every 6 months   diclofenac Sodium (VOLTAREN) 1 % GEL Apply topically.   estradiol (ESTRACE) 2 MG tablet Take 2 mg by mouth daily.   levothyroxine  (SYNTHROID ) 25 MCG tablet Take 1 tablet (25 mcg total) by mouth daily.   LORazepam  (ATIVAN ) 1 MG tablet Take 1 tablet (1 mg total) by mouth every 8 (eight) hours.   mirtazapine (REMERON) 30 MG tablet Take 30 mg by mouth at bedtime.   naloxone (NARCAN) nasal spray 4 mg/0.1 mL    omeprazole -sodium bicarbonate  (ZEGERID ) 40-1100 MG capsule Take 1 capsule by mouth daily before breakfast.   ondansetron  (ZOFRAN -ODT) 8 MG disintegrating tablet Take 1 tablet by mouth 3 (three) times daily. Take 1 tab by mouth tid   Oxycodone HCl 10 MG TABS Take 10 mg by mouth every 4 (four) hours as needed (pain).   progesterone (PROMETRIUM) 200 MG capsule Take 200 mg by mouth daily.   traZODone (DESYREL) 50 MG tablet Take 50 mg by mouth at bedtime.   Vitamin D, Ergocalciferol, (DRISDOL)  1.25 MG (50000 UNIT) CAPS capsule TAKE 1 CAPSULE BY MOUTH ONE TIME PER WEEK FOR 30 DAYS   [DISCONTINUED] metoprolol  tartrate (LOPRESSOR ) 50 MG tablet TAKE BOTH TABLETS 2 HR PRIOR TO CARDIAC PROCEDURE   [DISCONTINUED] metoprolol  tartrate (LOPRESSOR ) 50 MG tablet TAKE BOTH TABLETS 2 HR PRIOR TO CARDIAC PROCEDURE    Allergies:   Gentamycin [gentamicin], Alendronate, and Statins   Social History   Socioeconomic History   Marital status: Married    Spouse name: Not on file   Number of children: 3   Years of education: Not on file   Highest education level: Not on file  Occupational History   Occupation: Medicare ins. Investment banker, corporate: [employer]  Tobacco Use   Smoking status: Former    Current packs/day: 0.00    Average packs/day: 0.5 packs/day for 20.0 years (10.0 ttl  pk-yrs)    Types: Cigarettes    Start date: 06/12/1994    Quit date: 06/12/2014    Years since quitting: 9.3   Smokeless tobacco: Never  Vaping Use   Vaping status: Some Days   Devices: does not include nicotine  Substance and Sexual Activity   Alcohol use: No    Alcohol/week: 0.0 standard drinks of alcohol   Drug use: No   Sexual activity: Not on file  Other Topics Concern   Not on file  Social History Narrative   Lives at home w/ her husband   Right-handed   Very little caffeine use, drinks 7up   Last colon screening: 09/2011   Social Drivers of Health   Financial Resource Strain: Low Risk  (06/20/2023)   Overall Financial Resource Strain (CARDIA)    Difficulty of Paying Living Expenses: Not hard at all  Food Insecurity: No Food Insecurity (06/20/2023)   Hunger Vital Sign    Worried About Running Out of Food in the Last Year: Never true    Ran Out of Food in the Last Year: Never true  Transportation Needs: No Transportation Needs (06/20/2023)   PRAPARE - Administrator, Civil Service (Medical): No    Lack of Transportation (Non-Medical): No  Physical Activity: Inactive (06/20/2023)   Exercise Vital Sign    Days of Exercise per Week: 0 days    Minutes of Exercise per Session: 0 min  Stress: Stress Concern Present (06/20/2023)   [employer] of Occupational Health - Occupational Stress Questionnaire    Feeling of Stress : Very much  Social Connections: Moderately Integrated (06/20/2023)   Social Connection and Isolation Panel [NHANES]    Frequency of Communication with Friends and Family: Three times a week    Frequency of Social Gatherings with Friends and Family: Twice a week    Attends Religious Services: More than 4 times per year    Active Member of [employer] or Organizations: No    Attends Engineer, structural: Never    Marital Status: Married     Family History:  The patient's family history includes Anesthesia problems in her mother; Brain cancer in  her father; Breast cancer in her maternal aunt; Cancer in her mother; Hyperlipidemia in her father and mother; Hypertension in her father and mother; Ovarian cancer in her maternal grandmother; Subarachnoid hemorrhage in her mother.  ROS:   12-point review of systems is negative unless otherwise noted in the HPI.   EKGs/Labs/Other Studies Reviewed:    Studies reviewed were summarized above. The additional studies were reviewed today:  2D echo 02/09/2023: 1. Left ventricular  ejection fraction, by estimation, is 60 to 65%. Left  ventricular ejection fraction by PLAX is 56 %. The left ventricle has  normal function. The left ventricle has no regional wall motion  abnormalities. Left ventricular diastolic  parameters are consistent with Grade I diastolic dysfunction (impaired  relaxation).   2. Right ventricular systolic function is normal. The right ventricular  size is normal.   3. The mitral valve is normal in structure. No evidence of mitral valve  regurgitation. No evidence of mitral stenosis.   4. The aortic valve has an indeterminant number of cusps. Aortic valve  regurgitation is not visualized. No aortic stenosis is present.   5. The inferior vena cava is normal in size with greater than 50%  respiratory variability, suggesting right atrial pressure of 3 mmHg.  __________  Carotid artery ultrasound 02/09/2023: Right Carotid: Velocities in the right ICA are consistent with a 1-39% stenosis.   Left Carotid: Velocities in the left ICA are consistent with a 1-39% stenosis.   Vertebrals: Bilateral vertebral arteries demonstrate antegrade flow.  Subclavians: Normal flow hemodynamics were seen in bilateral subclavian arteries.  __________  Zio patch 12/2022:   The patient was monitored for 2 days.   The predominant rhythm was sinus with an average rate of 80 bpm (range 55-119 bpm in sinus).   There were rare PAC's and PVC's.   Two supraventricular runs were observed, lasting up to  5 beats with a maximum rate of 152 bpm.   No sustained arrhythmia or prolonged pause was seen.   There were no patient triggered events.   Predominantly sinus rhythm with rare PAC's and PVC's as well as two brief supraventricular runs.  Please note, monitoring period was short (2 days). __________  Coronary CTA 05/02/2021: FINDINGS: Aorta:  Normal size.  No calcifications.  No dissection.   Aortic Valve:  Trileaflet.  No calcifications.   Coronary Arteries:  Normal coronary origin.  Right dominance.   RCA is a dominant artery that gives rise to PDA and PLA. There is no plaque.   Left main is a large artery that gives rise to LAD and LCX arteries. There is no LM disease.   LAD has calcified plaque proximally causing mild (25%) stenosis.   LCX is a non-dominant artery that gives rise to two obtuse marginal branches. There is no plaque.   Other findings:   Normal pulmonary vein drainage into the left atrium.   Normal left atrial appendage without a thrombus.   Normal size of the pulmonary artery.   IMPRESSION: 1. Coronary calcium  score of 31.9. This was 69th percentile for age and sex matched control.   2. Normal coronary origin with right dominance.   3. Calcified plaque causing mild proximal LAD stenosis.   4. CAD-RADS 2. Mild non-obstructive CAD (25-49%). Consider non-atherosclerotic causes of chest pain. Consider preventive therapy and risk factor modification. __________  Carotid artery ultrasound 04/08/2021: Summary:  Right Carotid: Velocities in the right ICA are consistent with a 1-39%  stenosis. Non-hemodynamically significant plaque <50% noted in the CCA. The ECA appears <50% stenosed.   Left Carotid: Velocities in the left ICA are consistent with a 1-39%  stenosis. Non-hemodynamically significant plaque <50% noted in the CCA. The ECA appears <50% stenosed.   Vertebrals:  Bilateral vertebral arteries demonstrate antegrade flow.  Subclavians: Normal flow  hemodynamics were seen in bilateral subclavian arteries.  __________   2D echo 04/08/2021:  1. Left ventricular ejection fraction, by estimation, is 60 to 65%. The  left ventricle has normal function. The left ventricle has no regional  wall motion abnormalities. Left ventricular diastolic parameters are  consistent with Grade I diastolic  dysfunction (impaired relaxation). The average left ventricular global  longitudinal strain is -20.0 %. The global longitudinal strain is normal.   2. Right ventricular systolic function is normal. The right ventricular  size is normal. Tricuspid regurgitation signal is inadequate for assessing  PA pressure.   3. The mitral valve is normal in structure. No evidence of mitral valve  regurgitation. No evidence of mitral stenosis.   4. The aortic valve was not well visualized. Aortic valve regurgitation  is not visualized. No aortic stenosis is present.   5. The inferior vena cava is normal in size with greater than 50%  respiratory variability, suggesting right atrial pressure of 3 mmHg. __________   Zio patch 02/2021: The patient was monitored for 10 days, 18 hours. The predominant rhythm was sinus with an average rate of 80 bpm (range 50-141 bpm). There were rare PACs and PVCs. A single atrial run lasting 4 beats occurred with a maximum rate of 126 bpm. No sustained arrhythmia or prolonged pause was observed. Single patient triggered event corresponds to normal sinus rhythm.   Predominantly sinus rhythm with rare PACs and PVCs as well as a single brief episode of PSVT.  No significant arrhythmia observed. __________   2D echo 08/16/2015: - Left ventricle: The cavity size was normal. Systolic function was    normal. The estimated ejection fraction was in the range of 60%    to 65%. Wall motion was normal; there were no regional wall    motion abnormalities. Features are consistent with a pseudonormal    left ventricular filling pattern, with  concomitant abnormal    relaxation and increased filling pressure (grade 2 diastolic    dysfunction).  - Aortic valve: There was no regurgitation.  - Mitral valve: Transvalvular velocity was within the normal range.    There was no evidence for stenosis. There was no regurgitation.  - Right ventricle: The cavity size was normal. Wall thickness was    normal. Systolic function was normal.  - Tricuspid valve: There was no regurgitation.  - Inferior vena cava: The vessel was normal in size. The    respirophasic diameter changes were in the normal range (>= 50%),    consistent with normal central venous pressure. __________   Nuclear stress test 08/13/2015: The left ventricular ejection fraction is normal (55-65%). Nuclear stress EF: 64%. There was no ST segment deviation noted during stress. The study is normal. No evidence of ischemia This is a low risk study.   EKG:  EKG is ordered today.  The EKG ordered today demonstrates NSR, 73 bpm, low voltage QRS, baseline wandering, no acute ST-T changes  Recent Labs: 09/12/2023: ALT 11 09/26/2023: BUN 15; Creatinine, Ser 0.90; Hemoglobin 15.3; Platelets 243; Potassium 3.7; Sodium 139  Recent Lipid Panel    Component Value Date/Time   CHOL 228 (H) 04/12/2021 1418   TRIG 105 04/12/2021 1418   HDL 65 04/12/2021 1418   CHOLHDL 3.5 04/12/2021 1418   LDLCALC 145 (H) 04/12/2021 1418    PHYSICAL EXAM:    VS:  BP 124/72 (BP Location: Left Arm, Patient Position: Sitting, Cuff Size: Normal)   Pulse 73   Ht 5' (1.524 m)   Wt 125 lb 9.6 oz (57 kg)   SpO2 97%   BMI 24.53 kg/m   BMI: Body mass index is 24.53 kg/m.  Physical Exam Vitals reviewed.  Constitutional:      Appearance: She is well-developed.  HENT:     Head: Normocephalic and atraumatic.  Eyes:     General:        Right eye: No discharge.        Left eye: No discharge.  Cardiovascular:     Rate and Rhythm: Normal rate and regular rhythm.     Pulses:          Posterior tibial  pulses are 2+ on the right side and 2+ on the left side.     Heart sounds: Normal heart sounds, S1 normal and S2 normal. Heart sounds not distant. No midsystolic click and no opening snap. No murmur heard.    No friction rub.  Pulmonary:     Effort: Pulmonary effort is normal. No respiratory distress.     Breath sounds: Normal breath sounds. No decreased breath sounds, wheezing, rhonchi or rales.  Chest:     Chest wall: No tenderness.  Musculoskeletal:     Cervical back: Normal range of motion.     Right lower leg: No edema.     Left lower leg: No edema.  Skin:    General: Skin is warm and dry.     Nails: There is no clubbing.  Neurological:     Mental Status: She is alert and oriented to person, place, and time.  Psychiatric:        Speech: Speech normal.        Behavior: Behavior normal.        Thought Content: Thought content normal.        Judgment: Judgment normal.     Wt Readings from Last 3 Encounters:  10/02/23 125 lb 9.6 oz (57 kg)  09/26/23 123 lb (55.8 kg)  09/07/23 123 lb 12.8 oz (56.2 kg)     ASSESSMENT & PLAN:   Coronary calcification with precordial pain: Reports a long history of intermittent randomly occurring chest discomfort as well as an episode of substernal chest pain that radiated to the left shoulder and into the left upper arm with associated nausea, vomiting, dyspnea, and diaphoresis that lasted from approximately 5 PM to 3 AM leading up to her ED visit on 4/16.  Workup in the ED was reassuring including EKG that was nonacute, negative high-sensitivity troponin x 2, and CTA chest.  Schedule coronary CTA.  Continue aspirin  81 mg.  Intolerant to prior trial of atorvastatin  due to myalgias.  Anticipate trial of rosuvastatin following coronary CTA.  History of syncope: Felt to be orthostatic in etiology.  Cardiac workup including outpatient cardiac monitoring, echo, coronary CTA, and carotid artery ultrasound have been reassuring.    Carotid artery disease:  Carotid artery ultrasound from 01/2023 showed bilateral 1 to 39% ICA stenoses with antegrade flow of the bilateral vertebral arteries and normal flow hemodynamics of the bilateral subclavian arteries.  Polycythemia: Followed by hematology with recommendation to continue aspirin  and phlebotomy to prevent risk of thrombosis.      Disposition: F/u with Dr. Nolan Battle or an APP after coronary CTA.   Medication Adjustments/Labs and Tests Ordered: Current medicines are reviewed at length with the patient today.  Concerns regarding medicines are outlined above. Medication changes, Labs and Tests ordered today are summarized above and listed in the Patient Instructions accessible in Encounters.   Michaela Courser, PA-C 10/02/2023 12:29 PM     [facility] [place] [address] [place], [place] [address] [phone]  [phone] 

## 2023-10-02 ENCOUNTER — Ambulatory Visit: Attending: Physician Assistant | Admitting: Physician Assistant

## 2023-10-02 ENCOUNTER — Encounter: Payer: Self-pay | Admitting: Physician Assistant

## 2023-10-02 VITALS — BP 124/72 | HR 73 | Ht 60.0 in | Wt 125.6 lb

## 2023-10-02 DIAGNOSIS — I951 Orthostatic hypotension: Secondary | ICD-10-CM | POA: Insufficient documentation

## 2023-10-02 DIAGNOSIS — D751 Secondary polycythemia: Secondary | ICD-10-CM | POA: Diagnosis present

## 2023-10-02 DIAGNOSIS — R072 Precordial pain: Secondary | ICD-10-CM | POA: Insufficient documentation

## 2023-10-02 DIAGNOSIS — I6523 Occlusion and stenosis of bilateral carotid arteries: Secondary | ICD-10-CM | POA: Insufficient documentation

## 2023-10-02 DIAGNOSIS — I2510 Atherosclerotic heart disease of native coronary artery without angina pectoris: Secondary | ICD-10-CM | POA: Insufficient documentation

## 2023-10-02 DIAGNOSIS — Z87898 Personal history of other specified conditions: Secondary | ICD-10-CM | POA: Insufficient documentation

## 2023-10-02 MED ORDER — METOPROLOL TARTRATE 50 MG PO TABS: ORAL_TABLET | ORAL | 0 refills | Status: DC

## 2023-10-02 MED ORDER — METOPROLOL TARTRATE 100 MG PO TABS: ORAL_TABLET | ORAL | 0 refills | Status: DC

## 2023-10-02 NOTE — Patient Instructions (Signed)
 Medication Instructions:  Your physician recommends the following medication changes.  TAKE: Metoprolol  100 mg two hours prior to cardiac procedure  *If you need a refill on your cardiac medications before your next appointment, please call your pharmacy*  Lab Work: None ordered at this time   Testing/Procedures:   An IV will be required for this test and Nitroglycerin  will be given.  Hold all erectile dysfunction medications at least 3 days (72 hrs) prior to test. (Ie viagra, cialis, sildenafil, tadalafil, etc)   On the Night Before the Test: Be sure to Drink plenty of water. Do not consume any caffeinated/decaffeinated beverages or chocolate 12 hours prior to your test. Do not take any antihistamines 12 hours prior to your test.  On the Day of the Test: Drink plenty of water until 1 hour prior to the test. Do not eat any food 1 hour prior to test. You may take your regular medications prior to the test.  Take metoprolol  (Lopressor ) two hours prior to test. Patients who wear a continuous glucose monitor MUST remove the device prior to scanning. FEMALES- please wear underwire-free bra if available, avoid dresses & tight clothing      After the Test: Drink plenty of water. After receiving IV contrast, you may experience a mild flushed feeling. This is normal. On occasion, you may experience a mild rash up to 24 hours after the test. This is not dangerous. If this occurs, you can take Benadryl 25 mg, Zyrtec, Claritin, or Allegra and increase your fluid intake. (Patients taking Tikosyn should avoid Benadryl, and may take Zyrtec, Claritin, or Allegra) If you experience trouble breathing, this can be serious. If it is severe call 911 IMMEDIATELY. If it is mild, please call our office.  We will call to schedule your test 2-4 weeks out understanding that some insurance companies will need an authorization prior to the service being performed.   For more information and frequently asked  questions, please visit our website : [email or web address]  For non-scheduling related questions, please contact the cardiac imaging nurse navigator should you have any questions/concerns: Cardiac Imaging Nurse Navigators Direct Office Dial: [phone]   For scheduling needs, including cancellations and rescheduling, please call [place], [phone].   Follow-Up: At [facility], you and your health needs are our priority.  As part of our continuing mission to provide you with exceptional heart care, our providers are all part of one team.  This team includes your primary Cardiologist (physician) and Advanced Practice Providers or APPs (Physician Assistants and Nurse Practitioners) who all work together to provide you with the care you need, when you need it.  Your next appointment:   6-WEEKS  Provider:   You may see Sammy Crisp, MD or Varney Gentleman, PA-C

## 2023-10-04 ENCOUNTER — Encounter: Payer: Self-pay | Admitting: Hematology and Oncology

## 2023-10-04 ENCOUNTER — Inpatient Hospital Stay (HOSPITAL_BASED_OUTPATIENT_CLINIC_OR_DEPARTMENT_OTHER): Admitting: Hematology and Oncology

## 2023-10-04 VITALS — BP 134/78 | HR 73 | Temp 98.0°F | Resp 18 | Ht 60.0 in | Wt 128.0 lb

## 2023-10-04 DIAGNOSIS — D751 Secondary polycythemia: Secondary | ICD-10-CM | POA: Diagnosis not present

## 2023-10-04 NOTE — Progress Notes (Signed)
   [facility] OFFICE PROGRESS NOTE  Rodney Clamp, MD  ASSESSMENT & PLAN:  Assessment & Plan Polycythemia, secondary She has intermittent erythrocytosis  Serum level was unrevealing Peripheral blood was sent for JAK2 mutation, CAL R, MPL and CML; all test results were normal Since recent phlebotomy, her CBC was back to normal Other potential causes would be undiagnosed obstructive sleep apnea The patient is not symptomatic I have rule out malignancy I recommend consideration for intermittent blood donation in the future several times a year to keep her hemoglobin low If the patient cannot donate blood, she will return here for phlebotomy She will continue aspirin  therapy for primary prevention against risk of blood clot Addressed all her questions    No orders of the defined types were placed in this encounter.   INTERVAL HISTORY: Patient returns for review of blood test results She is doing well and asymptomatic I gave her copies of report and discussed test results and future follow-up  SUMMARY OF HEMATOLOGIC HISTORY:  Michaela Hall 68 y.o. female is here because of elevated hemoglobin. She is here accompanied by her husband, Michaela Hall  She was found to have abnormal CBC from recent blood draw I have the opportunity to review her electronic records dated back to 2016 She has intermittent elevated hemoglobin over the last few years Most recently, on August 03, 2023, her hemoglobin was elevated at 16.8 She denies intermittent headaches, shortness of breath on exertion, frequent leg cramps and occasional chest pain.  She never suffer from diagnosis of blood clot.  There is no prior diagnosis of obstructive sleep apnea. The patient denies weight loss or skin itching. The patient is a not a smoker. In April 2025, I have sent blood work for evaluation of myeloproliferative disorder, all test came back negative  Vitals:   10/04/23 0953  BP: 134/78  Pulse:  73  Resp: 18  Temp: 98 F (36.7 C)  SpO2: 100%

## 2023-10-04 NOTE — Assessment & Plan Note (Addendum)
 She has intermittent erythrocytosis  Serum level was unrevealing Peripheral blood was sent for JAK2 mutation, CAL R, MPL and CML; all test results were normal Since recent phlebotomy, her CBC was back to normal Other potential causes would be undiagnosed obstructive sleep apnea The patient is not symptomatic I have rule out malignancy I recommend consideration for intermittent blood donation in the future several times a year to keep her hemoglobin low If the patient cannot donate blood, she will return here for phlebotomy She will continue aspirin  therapy for primary prevention against risk of blood clot Addressed all her questions

## 2023-10-10 ENCOUNTER — Encounter (HOSPITAL_COMMUNITY): Payer: Self-pay

## 2023-10-11 ENCOUNTER — Ambulatory Visit
Admission: RE | Admit: 2023-10-11 | Discharge: 2023-10-11 | Disposition: A | Discharge status: Home / Self Care | Source: Ambulatory Visit | Attending: Physician Assistant | Admitting: Physician Assistant

## 2023-10-11 DIAGNOSIS — R072 Precordial pain: Secondary | ICD-10-CM | POA: Insufficient documentation

## 2023-10-11 MED ORDER — IOHEXOL 350 MG/ML SOLN
80.0000 mL | Freq: Once | INTRAVENOUS | Status: AC | PRN
  Administered 2023-10-11: 80 mL via INTRAVENOUS

## 2023-10-11 MED ORDER — NITROGLYCERIN 0.4 MG SL SUBL
0.8000 mg | SUBLINGUAL_TABLET | Freq: Once | SUBLINGUAL | Status: AC
  Administered 2023-10-11: 0.8 mg via SUBLINGUAL
  Filled 2023-10-11: qty 25

## 2023-10-11 NOTE — Progress Notes (Signed)
Pt tolerated procedure well with no issues. Pt ABCs intact. Pt denies any complaints. Pt encouraged to drink plenty of water throughout the day. Pt ambulatory with steady gait.

## 2023-10-24 ENCOUNTER — Ambulatory Visit: Payer: Self-pay | Admitting: Emergency Medicine

## 2023-11-13 MED ORDER — ROSUVASTATIN CALCIUM 20 MG PO TABS: 20.0000 mg | ORAL_TABLET | Freq: Every day | ORAL | 3 refills | Status: AC

## 2023-11-18 NOTE — Progress Notes (Deleted)
 Cardiology Office Note    Date:  11/18/2023   ID:  Michaela Hall, DOB May 24, 1956, MRN [id number]  PCP:  Kennyth Worth HERO, MD  Cardiologist:  Lonni Hanson, MD  Electrophysiologist:  None   Chief Complaint: Follow up  History of Present Illness:   Michaela Hall is a 68 y.o. female with history of nonobstructive CAD by coronary CTA in 10/2023, orthostatic hypotension with presyncope/syncope, minimal bilateral carotid artery stenosis, recently diagnosed with polycythemia, gastroparesis with chronic constipation and chronic narcotic use, lumbar postlaminectomy syndrome with chronic pain, degenerative disc disease, Raynaud's phenomenon, osteoarthritis, anxiety, and depression who presents for follow-up of coronary CTA.   She was previously evaluated by Dr. Burnard in 2017 for shortness of breath after having been essentially bedridden for 7 months, along with occasional chest discomfort.  Lexiscan  MPI at that time was low risk without evidence of ischemia or scar.  Echo demonstrated a preserved LV systolic function with grade 2 diastolic dysfunction.  She established care with Dr. Hanson in 02/2021 for further evaluation of approximately 6 episodes of passing out over the prior 6 months with multiple episodes of lightheadedness and presyncope.  In most cases, she reported these episodes occur when she wakes up at night and reported she is still lying down when she would begin to feel lightheaded.  There were no obvious precipitants that she could identify.  Physical exam and EKG, including QT interval, were normal.  Outpatient cardiac monitoring demonstrated a predominant rhythm of sinus with an average rate of 80 bpm (range 50 to 141 bpm), rare PACs and PVCs, single atrial run lasting 4 beats was noted with a maximal rate of 126 bpm, no sustained arrhythmias or prolonged pauses were observed.  A single patient triggered event corresponded to sinus rhythm.  Echo on 04/08/2021, demonstrated an EF of 60 to  65%, no regional wall motion abnormalities grade 1 diastolic dysfunction, normal global longitudinal strain, normal RV systolic function and ventricular cavity size, no significant valvular abnormalities, and an estimated right atrial pressure of 3 mmHg.  Carotid artery ultrasound on 10/28 showed bilateral 1 to 39% ICA stenoses with antegrade flow of the bilateral vertebral arteries and normal flow hemodynamics of the bilateral subclavian arteries.  Coronary CTA in 04/2021 showed a calcium  score of 31.9 which was the 69th percentile.  There was mild nonobstructive CAD with 25% proximal LAD stenosis   She was seen in 12/2022 for preprocedure cardiac risk stratification for EGD and colonoscopy.  She reported occasional blood pressure drops with associated presyncope.  She was orthostatic.  Echo in 01/2023 showed an EF of 60 to 65%, no regional wall motion normalities, grade 1 diastolic dysfunction, normal RV systolic function and ventricular cavity size, and an estimated right atrial pressure of 3 mmHg.  Carotid artery ultrasound in 01/2023 showed 1 to 39% bilateral ICA stenoses with antegrade flow of the bilateral vertebral arteries and normal flow hemodynamics in the bilateral subclavian arteries.  Zio patch showed the patient was monitored for only 2 days with a predominant rhythm of sinus with an average rate of 80 bpm with rare PACs and PVCs, 2 episodes of SVT lasting up to 5 beats, no sustained arrhythmias or prolonged pauses, and no patient triggered events.  In follow-up in 02/2023, she continued to report not feeling well and was with generalized fatigue.   She was seen in the ED on 09/26/2023 with chest pain that started 2 hours prior to arrival and radiated to the left shoulder  with some associated shortness of breath, diaphoresis, nausea, and vomiting.  Blood pressure in triage 164/99.  High-sensitivity troponin negative x 2.  EKG showed sinus rhythm with no acute ischemic changes.  Chest x-ray showed  likely atelectasis along the left lung base.  CTA chest was negative for PE with mild coronary artery calcification noted.  She followed up in our office on 10/02/2023 and reported a longstanding history of intermittent random occurring chest discomfort as well as an episode of chest discomfort on 4/16 that occurred at rest prompting her ED visit as outlined above.  Since her ED visit she reported continued intermittent randomly occurring chest discomfort that was longstanding.  Subsequent coronary CTA on 10/11/2023 showed a calcium  score of 113, which was the 79th percentile.  There was 25-49% stenosis in the proximal LAD.  ***   Labs independently reviewed: 09/2023 - TSH 9.412, Hgb 14.4, PLT 243, potassium 3.7, BUN 13, serum creatinine 0.92, albumin 4.8, AST/ALT normal 04/2021 - TC 228, TG 105, HDL 65, LDL 145  Past Medical History:  Diagnosis Date   Anxiety    Cervical spondylosis without myelopathy 10/21/2015   Cervical spondylosis without myelopathy 10/21/2015   Chronic pain    back and right hip   Complication of anesthesia    Depression    DJD (degenerative joint disease)    Hashimoto's disease    Headache(784.0)    Loss of weight 08/30/2022   Nausea and vomiting 08/30/2022   Osteoarthritis 09/2022   bilateral hip   PONV (postoperative nausea and vomiting)    Raynaud disease    Raynaud's disease    Weakness of left arm 10/21/2015    Past Surgical History:  Procedure Laterality Date   BACK SURGERY     ESOPHAGOGASTRODUODENOSCOPY  09/28/2011   Procedure: ESOPHAGOGASTRODUODENOSCOPY (EGD);  Surgeon: Lynwood LITTIE Celestia Mickey., MD;  Location: [facility];  Service: Endoscopy;  Laterality: N/A;   HIP SURGERY     KNEE SURGERY     SPINE SURGERY     TONSILLECTOMY      Current Medications: No outpatient medications have been marked as taking for the 11/19/23 encounter (Appointment) with Abigail Bernardino HERO, PA-C.    Allergies:   Gentamycin [gentamicin], Alendronate, and Statins   Social  History   Socioeconomic History   Marital status: Married    Spouse name: Not on file   Number of children: 3   Years of education: Not on file   Highest education level: Not on file  Occupational History   Occupation: Medicare ins. Investment banker, corporate: [employer]  Tobacco Use   Smoking status: Former    Current packs/day: 0.00    Average packs/day: 0.5 packs/day for 20.0 years (10.0 ttl pk-yrs)    Types: Cigarettes    Start date: 06/12/1994    Quit date: 06/12/2014    Years since quitting: 9.4   Smokeless tobacco: Never  Vaping Use   Vaping status: Some Days   Devices: does not include nicotine  Substance and Sexual Activity   Alcohol use: No    Alcohol/week: 0.0 standard drinks of alcohol   Drug use: No   Sexual activity: Not on file  Other Topics Concern   Not on file  Social History Narrative   Lives at home w/ her husband   Right-handed   Very little caffeine use, drinks 7up   Last colon screening: 09/2011   Social Drivers of Health   Financial Resource Strain: Low Risk  (06/20/2023)  Overall Financial Resource Strain (CARDIA)    Difficulty of Paying Living Expenses: Not hard at all  Food Insecurity: No Food Insecurity (06/20/2023)   Hunger Vital Sign    Worried About Running Out of Food in the Last Year: Never true    Ran Out of Food in the Last Year: Never true  Transportation Needs: No Transportation Needs (06/20/2023)   PRAPARE - Administrator, Civil Service (Medical): No    Lack of Transportation (Non-Medical): No  Physical Activity: Inactive (06/20/2023)   Exercise Vital Sign    Days of Exercise per Week: 0 days    Minutes of Exercise per Session: 0 min  Stress: Stress Concern Present (06/20/2023)   [employer] of Occupational Health - Occupational Stress Questionnaire    Feeling of Stress : Very much  Social Connections: Moderately Integrated (06/20/2023)   Social Connection and Isolation Panel [NHANES]    Frequency of Communication with  Friends and Family: Three times a week    Frequency of Social Gatherings with Friends and Family: Twice a week    Attends Religious Services: More than 4 times per year    Active Member of [employer] or Organizations: No    Attends Engineer, structural: Never    Marital Status: Married     Family History:  The patient's family history includes Anesthesia problems in her mother; Brain cancer in her father; Breast cancer in her maternal aunt; Cancer in her mother; Hyperlipidemia in her father and mother; Hypertension in her father and mother; Ovarian cancer in her maternal grandmother; Subarachnoid hemorrhage in her mother.  ROS:   12-point review of systems is negative unless otherwise noted in the HPI.   EKGs/Labs/Other Studies Reviewed:    Studies reviewed were summarized above. The additional studies were reviewed today:  Coronary CTA 10/11/2023: FINDINGS: Aorta:  Normal size.  No calcifications.  No dissection.   Aortic Valve:  Trileaflet.  No calcifications.   Coronary Arteries:  Normal coronary origin.  Right dominance.   RCA is a dominant artery. There is no plaque.   Left main gives rise to LAD and LCX arteries. LM has no disease.   LAD has calcified plaque proximally causing mild stenosis (25-49%).   LCX is a non-dominant artery.  There is no plaque.   Other findings:   Normal pulmonary vein drainage into the left atrium.   Normal left atrial appendage without a thrombus.   Normal size of the pulmonary artery.   IMPRESSION: 1. Coronary calcium  score of 113. This was 79th percentile for age and sex matched control. 2. Normal coronary origin with right dominance. 3. Mild proximal LAD stenosis (25-49%). 4. CAD-RADS 2. Mild non-obstructive CAD (25-49%). Consider preventive therapy and risk factor modification. __________  2D echo 02/09/2023: 1. Left ventricular ejection fraction, by estimation, is 60 to 65%. Left  ventricular ejection fraction by PLAX is 56  %. The left ventricle has  normal function. The left ventricle has no regional wall motion  abnormalities. Left ventricular diastolic  parameters are consistent with Grade I diastolic dysfunction (impaired  relaxation).   2. Right ventricular systolic function is normal. The right ventricular  size is normal.   3. The mitral valve is normal in structure. No evidence of mitral valve  regurgitation. No evidence of mitral stenosis.   4. The aortic valve has an indeterminant number of cusps. Aortic valve  regurgitation is not visualized. No aortic stenosis is present.   5. The inferior vena cava  is normal in size with greater than 50%  respiratory variability, suggesting right atrial pressure of 3 mmHg.  __________   Carotid artery ultrasound 02/09/2023: Right Carotid: Velocities in the right ICA are consistent with a 1-39% stenosis.   Left Carotid: Velocities in the left ICA are consistent with a 1-39% stenosis.   Vertebrals: Bilateral vertebral arteries demonstrate antegrade flow.  Subclavians: Normal flow hemodynamics were seen in bilateral subclavian arteries.  __________   Zio patch 12/2022:   The patient was monitored for 2 days.   The predominant rhythm was sinus with an average rate of 80 bpm (range 55-119 bpm in sinus).   There were rare PAC's and PVC's.   Two supraventricular runs were observed, lasting up to 5 beats with a maximum rate of 152 bpm.   No sustained arrhythmia or prolonged pause was seen.   There were no patient triggered events.   Predominantly sinus rhythm with rare PAC's and PVC's as well as two brief supraventricular runs.  Please note, monitoring period was short (2 days). __________   Coronary CTA 05/02/2021: FINDINGS: Aorta:  Normal size.  No calcifications.  No dissection.   Aortic Valve:  Trileaflet.  No calcifications.   Coronary Arteries:  Normal coronary origin.  Right dominance.   RCA is a dominant artery that gives rise to PDA and PLA. There  is no plaque.   Left main is a large artery that gives rise to LAD and LCX arteries. There is no LM disease.   LAD has calcified plaque proximally causing mild (25%) stenosis.   LCX is a non-dominant artery that gives rise to two obtuse marginal branches. There is no plaque.   Other findings:   Normal pulmonary vein drainage into the left atrium.   Normal left atrial appendage without a thrombus.   Normal size of the pulmonary artery.   IMPRESSION: 1. Coronary calcium  score of 31.9. This was 69th percentile for age and sex matched control.   2. Normal coronary origin with right dominance.   3. Calcified plaque causing mild proximal LAD stenosis.   4. CAD-RADS 2. Mild non-obstructive CAD (25-49%). Consider non-atherosclerotic causes of chest pain. Consider preventive therapy and risk factor modification. __________   Carotid artery ultrasound 04/08/2021: Summary:  Right Carotid: Velocities in the right ICA are consistent with a 1-39%  stenosis. Non-hemodynamically significant plaque <50% noted in the CCA. The ECA appears <50% stenosed.   Left Carotid: Velocities in the left ICA are consistent with a 1-39%  stenosis. Non-hemodynamically significant plaque <50% noted in the CCA. The ECA appears <50% stenosed.   Vertebrals:  Bilateral vertebral arteries demonstrate antegrade flow.  Subclavians: Normal flow hemodynamics were seen in bilateral subclavian arteries.  __________   2D echo 04/08/2021:  1. Left ventricular ejection fraction, by estimation, is 60 to 65%. The  left ventricle has normal function. The left ventricle has no regional  wall motion abnormalities. Left ventricular diastolic parameters are  consistent with Grade I diastolic  dysfunction (impaired relaxation). The average left ventricular global  longitudinal strain is -20.0 %. The global longitudinal strain is normal.   2. Right ventricular systolic function is normal. The right ventricular  size is  normal. Tricuspid regurgitation signal is inadequate for assessing  PA pressure.   3. The mitral valve is normal in structure. No evidence of mitral valve  regurgitation. No evidence of mitral stenosis.   4. The aortic valve was not well visualized. Aortic valve regurgitation  is not visualized. No aortic  stenosis is present.   5. The inferior vena cava is normal in size with greater than 50%  respiratory variability, suggesting right atrial pressure of 3 mmHg. __________   Zio patch 02/2021: The patient was monitored for 10 days, 18 hours. The predominant rhythm was sinus with an average rate of 80 bpm (range 50-141 bpm). There were rare PACs and PVCs. A single atrial run lasting 4 beats occurred with a maximum rate of 126 bpm. No sustained arrhythmia or prolonged pause was observed. Single patient triggered event corresponds to normal sinus rhythm.   Predominantly sinus rhythm with rare PACs and PVCs as well as a single brief episode of PSVT.  No significant arrhythmia observed. __________   2D echo 08/16/2015: - Left ventricle: The cavity size was normal. Systolic function was    normal. The estimated ejection fraction was in the range of 60%    to 65%. Wall motion was normal; there were no regional wall    motion abnormalities. Features are consistent with a pseudonormal    left ventricular filling pattern, with concomitant abnormal    relaxation and increased filling pressure (grade 2 diastolic    dysfunction).  - Aortic valve: There was no regurgitation.  - Mitral valve: Transvalvular velocity was within the normal range.    There was no evidence for stenosis. There was no regurgitation.  - Right ventricle: The cavity size was normal. Wall thickness was    normal. Systolic function was normal.  - Tricuspid valve: There was no regurgitation.  - Inferior vena cava: The vessel was normal in size. The    respirophasic diameter changes were in the normal range (>= 50%),     consistent with normal central venous pressure. __________   Nuclear stress test 08/13/2015: The left ventricular ejection fraction is normal (55-65%). Nuclear stress EF: 64%. There was no ST segment deviation noted during stress. The study is normal. No evidence of ischemia This is a low risk study.   EKG:  EKG is ordered today.  The EKG ordered today demonstrates ***  Recent Labs: 09/12/2023: ALT 11 09/26/2023: BUN 15; Creatinine, Ser 0.90; Hemoglobin 15.3; Platelets 243; Potassium 3.7; Sodium 139  Recent Lipid Panel    Component Value Date/Time   CHOL 228 (H) 04/12/2021 1418   TRIG 105 04/12/2021 1418   HDL 65 04/12/2021 1418   CHOLHDL 3.5 04/12/2021 1418   LDLCALC 145 (H) 04/12/2021 1418    PHYSICAL EXAM:    VS:  There were no vitals taken for this visit.  BMI: There is no height or weight on file to calculate BMI.  Physical Exam  Wt Readings from Last 3 Encounters:  10/04/23 128 lb (58.1 kg)  10/02/23 125 lb 9.6 oz (57 kg)  09/26/23 123 lb (55.8 kg)     ASSESSMENT & PLAN:   Nonobstructive CAD:  History of syncope:  Carotid artery stenosis:    {Are you ordering a CV Procedure (e.g. stress test, cath, DCCV, TEE, etc)?   Press F2        :[id number]}     Disposition: F/u with Dr. Mady or an APP in ***.   Medication Adjustments/Labs and Tests Ordered: Current medicines are reviewed at length with the patient today.  Concerns regarding medicines are outlined above. Medication changes, Labs and Tests ordered today are summarized above and listed in the Patient Instructions accessible in Encounters.   Signed, Bernardino Bring, PA-C 11/18/2023 10:14 AM     [facility] [place] [address] [place], [place] [address] [phone]

## 2023-11-19 ENCOUNTER — Ambulatory Visit: Admitting: Physician Assistant

## 2023-11-19 DIAGNOSIS — I2510 Atherosclerotic heart disease of native coronary artery without angina pectoris: Secondary | ICD-10-CM

## 2023-11-19 DIAGNOSIS — I6523 Occlusion and stenosis of bilateral carotid arteries: Secondary | ICD-10-CM

## 2023-11-19 DIAGNOSIS — Z87898 Personal history of other specified conditions: Secondary | ICD-10-CM

## 2023-12-05 ENCOUNTER — Telehealth: Payer: Self-pay | Admitting: Internal Medicine

## 2023-12-05 NOTE — Telephone Encounter (Signed)
.  SYNCOPECHMG   Pt c/o Syncope: STAT if syncope occurred within 24 hours and pt complains of lightheadedness.   High Priority if episode of passing out, completely, today or in last 24 hours   1. Did you pass out today? No   2. When is the last time you passed out? Monday 6/23  3. Has this occurred multiple times? yes  4. Did you have any symptoms prior to passing out? Weakness, sweating, lightheaded  5. Did you fall? Niece attempted to catch but pt fell to ground. No injuries. If so, are you on a blood thinner? No

## 2023-12-05 NOTE — Telephone Encounter (Signed)
 Called patient and left message for call back.

## 2023-12-07 NOTE — Telephone Encounter (Signed)
 Attempt #2 Left voicemail message to call back.

## 2023-12-10 NOTE — Telephone Encounter (Signed)
 Attempt # 3 Left voicemail message to call back Closing encounter.

## 2023-12-13 ENCOUNTER — Ambulatory Visit: Attending: Internal Medicine | Admitting: Internal Medicine

## 2023-12-13 NOTE — Progress Notes (Deleted)
  Cardiology Office Note:  .   Date:  12/13/2023  ID:  Michaela Hall, DOB 1956-05-20, MRN [id number] PCP: Kennyth Worth HERO, MD  [facility] Providers Cardiologist:  Lonni Hanson, MD { Click to update primary MD,subspecialty MD or APP then REFRESH:1}    History of Present Illness: .   Michaela Hall is a 68 y.o. female with history of nonobstructive coronary artery disease, minimal bilateral carotid artery stenosis, aortic atherosclerosis, orthostatic hypotension with presyncope/syncope, polycythemia, gastroparesis with chronic constipation, lumbar postlaminectomy syndrome with chronic pain, Raynaud's phenomenon, osteoarthritis, anxiety, and depression, who presents for evaluation of syncope.  She was last seen in our office in April by Bernardino Bring, PA following ED visit for chest pain.  ED workup was unremarkable other than atelectasis at the left lung base and mild coronary artery calcification noted on CTA chest to exclude PE.  She was referred for coronary CTA that demonstrated mild calcified proximal LAD disease of less than 50% and a calcium  score of 113 (79th percentile).  Mild esophageal wall thickening was also noted, which could be seen with reflux or esophagitis.  ROS: See HPI  Studies Reviewed: SABRA        Coronary CTA (10/11/2023): LMCA normal.  LAD with 25-49% stenosis in the proximal vessel with calcified plaque.  Nondominant LCx without disease.  Dominant RCA without disease.  Calcium  score 113 (79th percentile for age and sex matched controls).  Incidental note made of mild distal esophageal wall thickening, which can be seen with reflux or esophagitis.  Aortic atherosclerosis also noted.  Risk Assessment/Calculations:   {Does this patient have ATRIAL FIBRILLATION?:[id number]} No BP recorded.  {Refresh Note OR Click here to enter BP  :1}***       Physical Exam:   VS:  There were no vitals taken for this visit.   Wt Readings from Last 3 Encounters:  10/04/23 128 lb (58.1  kg)  10/02/23 125 lb 9.6 oz (57 kg)  09/26/23 123 lb (55.8 kg)    General:  NAD. Neck: No JVD or HJR. Lungs: Clear to auscultation bilaterally without wheezes or crackles. Heart: Regular rate and rhythm without murmurs, rubs, or gallops. Abdomen: Soft, nontender, nondistended. Extremities: No lower extremity edema.  ASSESSMENT AND PLAN: .    ***    {Are you ordering a CV Procedure (e.g. stress test, cath, DCCV, TEE, etc)?   Press F2        :[id number]}  Dispo: ***  Signed, Lonni Hanson, MD

## 2023-12-24 ENCOUNTER — Encounter: Payer: Self-pay | Admitting: Hematology and Oncology

## 2024-01-01 ENCOUNTER — Ambulatory Visit (INDEPENDENT_AMBULATORY_CARE_PROVIDER_SITE_OTHER): Admitting: Neurology

## 2024-01-01 ENCOUNTER — Encounter: Payer: Self-pay | Admitting: Hematology and Oncology

## 2024-01-01 ENCOUNTER — Encounter: Payer: Self-pay | Admitting: Neurology

## 2024-01-01 VITALS — BP 98/62 | HR 91 | Ht 60.0 in | Wt 118.0 lb

## 2024-01-01 DIAGNOSIS — I951 Orthostatic hypotension: Secondary | ICD-10-CM

## 2024-01-01 DIAGNOSIS — R2689 Other abnormalities of gait and mobility: Secondary | ICD-10-CM | POA: Diagnosis not present

## 2024-01-01 DIAGNOSIS — R55 Syncope and collapse: Secondary | ICD-10-CM

## 2024-01-01 DIAGNOSIS — M25551 Pain in right hip: Secondary | ICD-10-CM | POA: Diagnosis not present

## 2024-01-01 NOTE — Progress Notes (Unsigned)
 [facility]  PATIENT: Michaela Hall DOB: June 27, 1955  REQUESTING CLINICIAN: Tommas Pears, MD HISTORY FROM: Patient/Husband  REASON FOR VISIT: Multiple falls.    HISTORICAL  CHIEF COMPLAINT:  Chief Complaint  Patient presents with   New Patient (Initial Visit)    Rm 13, with husband, NP for seizure vs syncope    HISTORY OF PRESENT ILLNESS:  This is a 68 year old with past medical history of hypothyroidism, hyperlipidemia, chronic right hip pain, insomnia who is presenting with syncopal episodes.  Patient reports these symptoms started about a year ago.  The first episode, she remembers getting up on the floor with vomit all over, she did not understand how she fell.  From then she continued to have falls, sometime when standing, when walking and she also fell in the bathroom again waking up to vomit.  She tells me prior to these episodes sometimes she can feel like she is going to pass out.  Feels like the walls are closing up, the next thing she is on the floor.  Last episode was last week.  Fortunately for patient no major injury.  Denies any confusion after the episode, no reported convulsion.  On top of her syncope/falls, she has been complaining of chronic right hip pain.  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 She has antalgic gait.     Handedness: Right handed   Onset: Last year.   Seizure Type: Weakness, feels like walls are closing, sometimes feels like she is going to pass out, then wakes up on the floor. Sometimes associated with vomiting X2.    Current frequency: Last episode last week   Any injuries from seizures: Denies   Seizure risk factors: History of concussion   Previous ASMs: None   Currenty ASMs: None   ASMs side effects: N/A   Brain Images: Not available for  review   Previous EEGs: Not available for review    OTHER MEDICAL CONDITIONS: Hypothyroidism, Hyperlipidemia, Chronic right hip pain, Insomnia   REVIEW OF SYSTEMS: Full 14 system review of systems performed and negative with exception of: As noted in the HPI  ALLERGIES: Allergies  Allergen Reactions   Gentamycin [Gentamicin] Anaphylaxis   Alendronate     Other Reaction(s): Pain all over body   Statins     Other Reaction(s): myalgias    HOME MEDICATIONS: Outpatient Medications Prior to Visit  Medication Sig Dispense Refill   aspirin  EC 81 MG tablet Take 1 tablet (81 mg total) by mouth daily. Swallow whole. 90 tablet 3   Calcium  Carbonate (CALCIUM  500 PO) Take 500 mg by mouth 2 (two) times daily.     cyanocobalamin (VITAMIN B12) 1000 MCG/ML injection INJECT ONCE A WEEK FOR 30 DAYS     denosumab (PROLIA) 60 MG/ML SOSY injection 60mg  Subcutaneous every 6 months     diclofenac Sodium (VOLTAREN) 1 % GEL Apply topically.     estradiol (ESTRACE) 2 MG tablet Take 2 mg by mouth daily.     levothyroxine  (SYNTHROID ) 25 MCG tablet Take 1 tablet (25 mcg total) by mouth daily. 90 tablet 0   levothyroxine  (SYNTHROID ) 88 MCG tablet Take 88 mcg by mouth daily before breakfast.     LORazepam  (ATIVAN ) 1 MG tablet Take 1 tablet (1 mg total) by mouth every 8 (eight) hours. 90 tablet 3  metoprolol  tartrate (LOPRESSOR ) 100 MG tablet TAKE 1 TABLET TWO HOUR PRIOR TO CARDIAC PROCEDURE 1 tablet 0   naloxone (NARCAN) nasal spray 4 mg/0.1 mL      omeprazole -sodium bicarbonate  (ZEGERID ) 40-1100 MG capsule Take 1 capsule by mouth daily before breakfast.     ondansetron  (ZOFRAN -ODT) 8 MG disintegrating tablet Take 1 tablet by mouth 3 (three) times daily. Take 1 tab by mouth tid     Oxycodone HCl 10 MG TABS Take 10 mg by mouth every 4 (four) hours as needed (pain).     progesterone (PROMETRIUM) 200 MG capsule Take 200 mg by mouth daily.     rosuvastatin  (CRESTOR ) 20 MG tablet Take 1 tablet (20 mg total) by mouth  daily. 90 tablet 3   traZODone (DESYREL) 50 MG tablet Take 50 mg by mouth at bedtime.     Vitamin D, Ergocalciferol, (DRISDOL) 1.25 MG (50000 UNIT) CAPS capsule TAKE 1 CAPSULE BY MOUTH ONE TIME PER WEEK FOR 30 DAYS     mirtazapine (REMERON) 30 MG tablet Take 30 mg by mouth at bedtime. (Patient not taking: Reported on 01/01/2024)     No facility-administered medications prior to visit.    PAST MEDICAL HISTORY: Past Medical History:  Diagnosis Date   Anxiety    Cervical spondylosis without myelopathy 10/21/2015   Cervical spondylosis without myelopathy 10/21/2015   Chronic pain    back and right hip   Complication of anesthesia    Depression    DJD (degenerative joint disease)    Hashimoto's disease    Headache(784.0)    Loss of weight 08/30/2022   Nausea and vomiting 08/30/2022   Osteoarthritis 09/2022   bilateral hip   PONV (postoperative nausea and vomiting)    Raynaud disease    Raynaud's disease    Weakness of left arm 10/21/2015    PAST SURGICAL HISTORY: Past Surgical History:  Procedure Laterality Date   BACK SURGERY     ESOPHAGOGASTRODUODENOSCOPY  09/28/2011   Procedure: ESOPHAGOGASTRODUODENOSCOPY (EGD);  Surgeon: Lynwood LITTIE Celestia Mickey., MD;  Location: [facility];  Service: Endoscopy;  Laterality: N/A;   HIP SURGERY     KNEE SURGERY     SPINE SURGERY     TONSILLECTOMY      FAMILY HISTORY: Family History  Problem Relation Age of Onset   Anesthesia problems Mother    Cancer Mother        ovary/uterus & breast   Hyperlipidemia Mother    Hypertension Mother    Subarachnoid hemorrhage Mother    Hyperlipidemia Father    Hypertension Father    Brain cancer Father    Breast cancer Maternal Aunt    Ovarian cancer Maternal Grandmother     SOCIAL HISTORY: Social History   Socioeconomic History   Marital status: Married    Spouse name: Not on file   Number of children: 3   Years of education: Not on file   Highest education level: Not on file  Occupational  History   Occupation: Medicare ins. Investment banker, corporate: [employer]  Tobacco Use   Smoking status: Former    Current packs/day: 0.00    Average packs/day: 0.5 packs/day for 20.0 years (10.0 ttl pk-yrs)    Types: Cigarettes    Start date: 06/12/1994    Quit date: 06/12/2014    Years since quitting: 9.5   Smokeless tobacco: Never  Vaping Use   Vaping status: Some Days   Devices: does not include nicotine  Substance and Sexual Activity  Alcohol use: No    Alcohol/week: 0.0 standard drinks of alcohol   Drug use: No   Sexual activity: Not on file  Other Topics Concern   Not on file  Social History Narrative   Lives at home w/ her husband   Right-handed   Very little caffeine use, drinks 7up   Last colon screening: 09/2011   Social Drivers of Health   Financial Resource Strain: Low Risk  (06/20/2023)   Overall Financial Resource Strain (CARDIA)    Difficulty of Paying Living Expenses: Not hard at all  Food Insecurity: No Food Insecurity (06/20/2023)   Hunger Vital Sign    Worried About Running Out of Food in the Last Year: Never true    Ran Out of Food in the Last Year: Never true  Transportation Needs: No Transportation Needs (06/20/2023)   PRAPARE - Administrator, Civil Service (Medical): No    Lack of Transportation (Non-Medical): No  Physical Activity: Inactive (06/20/2023)   Exercise Vital Sign    Days of Exercise per Week: 0 days    Minutes of Exercise per Session: 0 min  Stress: Stress Concern Present (06/20/2023)   [employer] of Occupational Health - Occupational Stress Questionnaire    Feeling of Stress : Very much  Social Connections: Moderately Integrated (06/20/2023)   Social Connection and Isolation Panel    Frequency of Communication with Friends and Family: Three times a week    Frequency of Social Gatherings with Friends and Family: Twice a week    Attends Religious Services: More than 4 times per year    Active Member of [employer] or Organizations:  No    Attends Banker Meetings: Never    Marital Status: Married  Catering manager Violence: Not At Risk (06/20/2023)   Humiliation, Afraid, Rape, and Kick questionnaire    Fear of Current or Ex-Partner: No    Emotionally Abused: No    Physically Abused: No    Sexually Abused: No    PHYSICAL EXAM  GENERAL EXAM/CONSTITUTIONAL: Vitals:  Vitals:   01/01/24 1409 01/01/24 1445  BP: 128/64 98/62  Pulse:  91  Weight: 118 lb (53.5 kg)   Height: 5' (1.524 m)    Body mass index is 23.05 kg/m. Wt Readings from Last 3 Encounters:  01/01/24 118 lb (53.5 kg)  10/04/23 128 lb (58.1 kg)  10/02/23 125 lb 9.6 oz (57 kg)   Patient is in no distress; well developed, nourished and groomed; neck is supple  MUSCULOSKELETAL: Gait, strength, tone, movements noted in Neurologic exam below  NEUROLOGIC: MENTAL STATUS:      No data to display         awake, alert, oriented to person, place and time recent and remote memory intact normal attention and concentration language fluent, comprehension intact, naming intact fund of knowledge appropriate  CRANIAL NERVE:  2nd, 3rd, 4th, 6th - Visual fields full to confrontation, extraocular muscles intact, no nystagmus 5th - facial sensation symmetric 7th - facial strength symmetric 8th - hearing intact 9th - palate elevates symmetrically, uvula midline 11th - shoulder shrug symmetric 12th - tongue protrusion midline  MOTOR:  normal bulk and tone, full strength in the BUE, BLE but RLE is limited by pain   SENSORY:  normal and symmetric to light touch  COORDINATION:  finger-nose-finger, fine finger movements normal  GAIT/STATION:  Antalgic gait     DIAGNOSTIC DATA (LABS, IMAGING, TESTING) - I reviewed patient records, labs, notes, testing and imaging myself  where available.  Lab Results  Component Value Date   WBC 8.1 09/26/2023   HGB 15.3 (H) 09/26/2023   HCT 45.0 09/26/2023   MCV 93.5 09/26/2023   PLT 243  09/26/2023      Component Value Date/Time   NA 139 09/26/2023 2135   NA 143 04/12/2021 1418   K 3.7 09/26/2023 2135   CL 104 09/26/2023 2135   CO2 23 09/26/2023 2054   GLUCOSE 88 09/26/2023 2135   BUN 15 09/26/2023 2135   BUN 16 04/12/2021 1418   CREATININE 0.90 09/26/2023 2135   CREATININE 0.97 09/12/2023 1357   CREATININE 0.88 12/25/2014 1555   CALCIUM  9.2 09/26/2023 2054   PROT 7.7 09/12/2023 1357   PROT 6.5 04/12/2021 1418   ALBUMIN 4.8 09/12/2023 1357   ALBUMIN 4.7 04/12/2021 1418   AST 19 09/12/2023 1357   ALT 11 09/12/2023 1357   ALKPHOS 63 09/12/2023 1357   BILITOT 0.4 09/12/2023 1357   GFRNONAA >60 09/26/2023 2054   GFRNONAA >60 09/12/2023 1357   GFRAA 81 (L) 07/03/2013 2024   Lab Results  Component Value Date   CHOL 228 (H) 04/12/2021   HDL 65 04/12/2021   LDLCALC 145 (H) 04/12/2021   TRIG 105 04/12/2021   No results found for: HGBA1C No results found for: VITAMINB12 Lab Results  Component Value Date   TSH 4.01 03/23/2021      ASSESSMENT AND PLAN  68 y.o. year old female  with history of hypothyroidism, hyperlipidemia, insomnia, chronic right hip pain who is presenting with syncopal episodes.  These episodes are preceded by a feeling of passing out, walls closing and on 2 occasions, she did have vomiting.  Denies any confusion and no reported abnormal movement on the floor.  On exam today she was noted to have orthostatic hypotension, on top of that, she also has antalgic gait due to her chronic right hip pain.  I have informed patient my suspicion is that her falls are related to orthostatic hypotension and her chronic right hip pain causing abnormal gait.  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  She does tell me that she drinks enough fluid but again she did have a 30 points drop from sitting down to standing.   However, I have informed patient that I will still obtain a routine EEG and if this is normal, she should continue to follow-up with PCP regarding further management of the orthostatic hypotension but I did strongly recommend her to consider moving forward with the hip surgery.  One potential complication is that she can fall, broke her hip and ending up having emergent surgery and not a planned surgery.  She voiced understanding.  Return as needed.    1. Syncope, unspecified syncope type   2. Orthostatic hypotension   3. Right hip pain   4. Antalgic gait     Patient Instructions  Increase fluid intake  Consider adding Liquid IV in the water, Gatorade or Pedialyte  Consider right hip replacement as recommended by orthopedist  Routine EEG, I will contact you to go over the results  Follow up with PCP regarding further recommendation for the orthostatic hypotension Return if needed     [place] statutes, patients with seizures are not allowed to drive until they have been seizure-free for six months.  Other recommendations include  using caution when using heavy equipment or power tools. Avoid working on ladders or at heights. Take showers instead of baths.  Do not swim alone.  Ensure the water temperature is not too high on the home water heater. Do not go swimming alone. Do not lock yourself in a room alone (i.e. bathroom). When caring for infants or small children, sit down when holding, feeding, or changing them to minimize risk of injury to the child in the event you have a seizure. Maintain good sleep hygiene. Avoid alcohol.  Also recommend adequate sleep, hydration, good diet and minimize stress.   During the Seizure  - First, ensure adequate ventilation and place patients on the floor on their left side  Loosen clothing around the neck and ensure the airway is patent. If the patient is clenching the teeth, do not force the mouth open with any object as this can cause  severe damage - Remove all items from the surrounding that can be hazardous. The patient may be oblivious to what's happening and may not even know what he or she is doing. If the patient is confused and wandering, either gently guide him/her away and block access to outside areas - Reassure the individual and be comforting - Call 911. In most cases, the seizure ends before EMS arrives. However, there are cases when seizures may last over 3 to 5 minutes. Or the individual may have developed breathing difficulties or severe injuries. If a pregnant patient or a person with diabetes develops a seizure, it is prudent to call an ambulance. - Finally, if the patient does not regain full consciousness, then call EMS. Most patients will remain confused for about 45 to 90 minutes after a seizure, so you must use judgment in calling for help. - Avoid restraints but make sure the patient is in a bed with padded side rails - Place the individual in a lateral position with the neck slightly flexed; this will help the saliva drain from the mouth and prevent the tongue from falling backward - Remove all nearby furniture and other hazards from the area - Provide verbal assurance as the individual is regaining consciousness - Provide the patient with privacy if possible - Call for help and start treatment as ordered by the caregiver   After the Seizure (Postictal Stage)  After a seizure, most patients experience confusion, fatigue, muscle pain and/or a headache. Thus, one should permit the individual to sleep. For the next few days, reassurance is essential. Being calm and helping reorient the person is also of importance.  Most seizures are painless and end spontaneously. Seizures are not harmful to others but can lead to complications such as stress on the lungs, brain and the heart. Individuals with prior lung problems may develop labored breathing and respiratory distress.    Discussed Patients with epilepsy  have a small risk of sudden unexpected death, a condition referred to as sudden unexpected death in epilepsy (SUDEP). SUDEP is defined specifically as the sudden, unexpected, witnessed or unwitnessed, nontraumatic and nondrowning death in patients with epilepsy with or without evidence for a seizure, and excluding documented status epilepticus, in which post mortem examination does not reveal a structural or toxicologic cause for death     Orders Placed This Encounter  Procedures   EEG adult    No orders of the defined types were placed in this encounter.   Return if symptoms worsen or fail to improve.  I have spent a total of 60 minutes dedicated  to this patient today, preparing to see patient, performing a medically appropriate examination and evaluation, ordering tests and/or medications and procedures, and counseling and educating the patient/family/caregiver; independently interpreting result and communicating results to the family/patient/caregiver; and documenting clinical information in the electronic medical record.   Pastor Falling, MD 01/02/2024, 8:28 AM  [facility] [address] [place], [place] [address] [phone]

## 2024-01-02 ENCOUNTER — Encounter: Payer: Self-pay | Admitting: Neurology

## 2024-01-02 NOTE — Patient Instructions (Signed)
 Increase fluid intake  Consider adding Liquid IV in the water, Gatorade or Pedialyte  Consider right hip replacement as recommended by orthopedist  Routine EEG, I will contact you to go over the results  Follow up with PCP regarding further recommendation for the orthostatic hypotension Return if needed

## 2024-01-08 ENCOUNTER — Observation Stay (HOSPITAL_COMMUNITY)
Admission: EM | Admit: 2024-01-08 | Discharge: 2024-01-09 | Disposition: A | Discharge status: Home / Self Care | Attending: Family Medicine | Admitting: Family Medicine

## 2024-01-08 ENCOUNTER — Encounter (HOSPITAL_COMMUNITY): Payer: Self-pay | Admitting: *Deleted

## 2024-01-08 ENCOUNTER — Emergency Department (HOSPITAL_COMMUNITY)

## 2024-01-08 ENCOUNTER — Other Ambulatory Visit: Payer: Self-pay

## 2024-01-08 ENCOUNTER — Other Ambulatory Visit: Admitting: *Deleted

## 2024-01-08 DIAGNOSIS — Z7982 Long term (current) use of aspirin: Secondary | ICD-10-CM | POA: Insufficient documentation

## 2024-01-08 DIAGNOSIS — M48061 Spinal stenosis, lumbar region without neurogenic claudication: Secondary | ICD-10-CM | POA: Diagnosis not present

## 2024-01-08 DIAGNOSIS — E876 Hypokalemia: Secondary | ICD-10-CM | POA: Insufficient documentation

## 2024-01-08 DIAGNOSIS — Z87891 Personal history of nicotine dependence: Secondary | ICD-10-CM | POA: Insufficient documentation

## 2024-01-08 DIAGNOSIS — D751 Secondary polycythemia: Secondary | ICD-10-CM | POA: Diagnosis present

## 2024-01-08 DIAGNOSIS — M5416 Radiculopathy, lumbar region: Principal | ICD-10-CM | POA: Insufficient documentation

## 2024-01-08 DIAGNOSIS — M5442 Lumbago with sciatica, left side: Secondary | ICD-10-CM

## 2024-01-08 DIAGNOSIS — M542 Cervicalgia: Secondary | ICD-10-CM | POA: Diagnosis present

## 2024-01-08 DIAGNOSIS — E039 Hypothyroidism, unspecified: Secondary | ICD-10-CM | POA: Diagnosis not present

## 2024-01-08 DIAGNOSIS — M545 Low back pain, unspecified: Secondary | ICD-10-CM | POA: Diagnosis present

## 2024-01-08 DIAGNOSIS — G8929 Other chronic pain: Secondary | ICD-10-CM | POA: Diagnosis present

## 2024-01-08 LAB — COMPREHENSIVE METABOLIC PANEL WITH GFR
ALT: 9 U/L (ref 0–44)
AST: 15 U/L (ref 15–41)
Albumin: 4.3 g/dL (ref 3.5–5.0)
Alkaline Phosphatase: 51 U/L (ref 38–126)
Anion gap: 14 (ref 5–15)
BUN: 12 mg/dL (ref 8–23)
CO2: 25 mmol/L (ref 22–32)
Calcium: 9.4 mg/dL (ref 8.9–10.3)
Chloride: 99 mmol/L (ref 98–111)
Creatinine, Ser: 0.8 mg/dL (ref 0.44–1.00)
GFR, Estimated: >60 mL/min (ref >60)
Glucose, Bld: 131 mg/dL — ABNORMAL HIGH (ref 70–99)
Potassium: 3.4 mmol/L — ABNORMAL LOW (ref 3.5–5.1)
Sodium: 138 mmol/L (ref 135–145)
Total Bilirubin: 0.8 mg/dL (ref 0.0–1.2)
Total Protein: 7.3 g/dL (ref 6.5–8.1)

## 2024-01-08 LAB — CBC WITH DIFFERENTIAL/PLATELET
Abs Immature Granulocytes: 0.04 K/uL (ref 0.00–0.07)
Basophils Absolute: 0 K/uL (ref 0.0–0.1)
Basophils Relative: 0 %
Eosinophils Absolute: 0 K/uL (ref 0.0–0.5)
Eosinophils Relative: 0 %
HCT: 49.7 % — ABNORMAL HIGH (ref 36.0–46.0)
Hemoglobin: 16.5 g/dL — ABNORMAL HIGH (ref 12.0–15.0)
Immature Granulocytes: 0 %
Lymphocytes Relative: 6 %
Lymphs Abs: 0.6 K/uL — ABNORMAL LOW (ref 0.7–4.0)
MCH: 30.2 pg (ref 26.0–34.0)
MCHC: 33.2 g/dL (ref 30.0–36.0)
MCV: 91 fL (ref 80.0–100.0)
Monocytes Absolute: 0.2 K/uL (ref 0.1–1.0)
Monocytes Relative: 2 %
Neutro Abs: 9.1 K/uL — ABNORMAL HIGH (ref 1.7–7.7)
Neutrophils Relative %: 92 %
Platelets: 209 K/uL (ref 150–400)
RBC: 5.46 MIL/uL — ABNORMAL HIGH (ref 3.87–5.11)
RDW: 13.1 % (ref 11.5–15.5)
WBC: 10 K/uL (ref 4.0–10.5)
nRBC: 0 % (ref 0.0–0.2)

## 2024-01-08 LAB — URINALYSIS, W/ REFLEX TO CULTURE (INFECTION SUSPECTED)
Bilirubin Urine: NEGATIVE
Glucose, UA: NEGATIVE mg/dL
Hgb urine dipstick: NEGATIVE
Ketones, ur: 5 mg/dL — AB
Leukocytes,Ua: NEGATIVE
Nitrite: NEGATIVE
Protein, ur: NEGATIVE mg/dL
Specific Gravity, Urine: 1.006 (ref 1.005–1.030)
pH: 7 (ref 5.0–8.0)

## 2024-01-08 MED ORDER — POTASSIUM CHLORIDE 20 MEQ PO PACK
20.0000 meq | PACK | Freq: Once | ORAL | Status: AC
  Administered 2024-01-08: 20 meq via ORAL
  Filled 2024-01-08: qty 1

## 2024-01-08 MED ORDER — HYDROMORPHONE HCL 1 MG/ML IJ SOLN
1.0000 mg | Freq: Once | INTRAMUSCULAR | Status: AC
  Administered 2024-01-08: 1 mg via INTRAVENOUS
  Filled 2024-01-08: qty 1

## 2024-01-08 MED ORDER — PREDNISONE 10 MG (21) PO TBPK: ORAL_TABLET | Freq: Every day | ORAL | 0 refills | Status: DC

## 2024-01-08 MED ORDER — LEVOTHYROXINE SODIUM 88 MCG PO TABS
88.0000 ug | ORAL_TABLET | Freq: Every day | ORAL | Status: DC
  Administered 2024-01-09: 88 ug via ORAL
  Filled 2024-01-08: qty 1

## 2024-01-08 MED ORDER — TRAZODONE HCL 50 MG PO TABS
50.0000 mg | ORAL_TABLET | Freq: Every evening | ORAL | Status: DC | PRN
  Filled 2024-01-08: qty 1

## 2024-01-08 MED ORDER — OXYCODONE HCL 5 MG PO TABS
10.0000 mg | ORAL_TABLET | ORAL | Status: DC | PRN
  Administered 2024-01-09: 10 mg via ORAL
  Filled 2024-01-08: qty 2

## 2024-01-08 NOTE — ED Triage Notes (Signed)
 BIB [facility] from home for back pain, bladder pain, urinary retention and constipation. Seeing [facility] for back pain r/t back injury (Drs. Bonner and Alusio). No relief with percocet PTA. Reports injured back 3 weeks ago. Rates back pain 10/10. Endorses constipation (last BM 1 week ago), urinary retention (just intermittent dribbling). VSS. EDP into see upon arrival. Alert, NAD, calm, interactive.

## 2024-01-08 NOTE — H&P (Signed)
 History and Physical    Michaela Hall DOB: [birth year] DOA: 01/08/2024  Patient coming from: Home.  Chief Complaint: Low back pain.  HPI: Michaela Hall is a 68 y.o. female with history of chronic back pain, hypothyroidism, hyperlipidemia, secondary polycythemia presents to the ER after patient had worsening low back pain and was advised by patient's orthopedic office to come to the ER.  Patient has been having worsening low back pain for the last 3 weeks after she had a fall and hurt her back and has been following with Dr. Bonner at the orthopedic office.  Patient was started on prednisone  course for the last 1 week, last dose is due for tomorrow.  Today patient stated that she also had worsening pain with some urinary retention and was instructed to come to the ER.  Pain is mostly in the low back radiating to her left lower extremity.  Denies any fever or chills.    ED Course: In the ER patient has significant pain on lifting her left lower extremity up.  MRI of the L-spine was done which was reviewed by neurosurgeon Dr. Dorn Ned who felt that patient's picture showed features concerning for lumbar radiculopathy with moderate spinal stenosis and requested medical management with physical therapy.  Since patient was having worsening pain was given additional dose of Dilaudid  and as per ER physician discussion with the neurosurgeon if pain was uncontrolled to admit for observation.  Labs show potassium of 3.4 and hemoglobin of 16.5.  Patient was given multiple doses of Dilaudid  in the ER.  In the ER patient was able to void urine without difficulty.  Review of Systems: As per HPI, rest all negative.   Past Medical History:  Diagnosis Date   Anxiety    Cervical spondylosis without myelopathy 10/21/2015   Cervical spondylosis without myelopathy 10/21/2015   Chronic pain    back and right hip   Complication of anesthesia    Depression    DJD (degenerative joint disease)     Hashimoto's disease    Headache(784.0)    Loss of weight 08/30/2022   Nausea and vomiting 08/30/2022   Osteoarthritis 09/2022   bilateral hip   PONV (postoperative nausea and vomiting)    Raynaud disease    Raynaud's disease    Weakness of left arm 10/21/2015    Past Surgical History:  Procedure Laterality Date   BACK SURGERY     ESOPHAGOGASTRODUODENOSCOPY  09/28/2011   Procedure: ESOPHAGOGASTRODUODENOSCOPY (EGD);  Surgeon: Lynwood LITTIE Celestia Mickey., MD;  Location: [facility];  Service: Endoscopy;  Laterality: N/A;   HIP SURGERY     KNEE SURGERY     SPINE SURGERY     TONSILLECTOMY       reports that she quit smoking about 9 years ago. Her smoking use included cigarettes. She started smoking about 29 years ago. She has a 10 pack-year smoking history. She has never used smokeless tobacco. She reports that she does not drink alcohol and does not use drugs.  Allergies  Allergen Reactions   Gentamycin [Gentamicin] Anaphylaxis   Alendronate     Other Reaction(s): Pain all over body   Statins     Other Reaction(s): myalgias    Family History  Problem Relation Age of Onset   Anesthesia problems Mother    Cancer Mother        ovary/uterus & breast   Hyperlipidemia Mother    Hypertension Mother    Subarachnoid hemorrhage Mother    Hyperlipidemia  Father    Hypertension Father    Brain cancer Father    Breast cancer Maternal Aunt    Ovarian cancer Maternal Grandmother     Prior to Admission medications   Medication Sig Start Date End Date Taking? Authorizing Provider  predniSONE  (STERAPRED UNI-PAK 21 TAB) 10 MG (21) TBPK tablet Take by mouth daily. Take 6 tabs by mouth daily  for 2 days, then 5 tabs for 2 days, then 4 tabs for 2 days, then 3 tabs for 2 days, 2 tabs for 2 days, then 1 tab by mouth daily for 2 days 01/08/24  Yes Pamella Ozell LABOR, DO  aspirin  EC 81 MG tablet Take 1 tablet (81 mg total) by mouth daily. Swallow whole. 05/03/21   Abigail Bernardino HERO, PA-C  Calcium  Carbonate  (CALCIUM  500 PO) Take 500 mg by mouth 2 (two) times daily.    [provider]  cyanocobalamin (VITAMIN B12) 1000 MCG/ML injection INJECT ONCE A WEEK FOR 30 DAYS    [provider]  denosumab (PROLIA) 60 MG/ML SOSY injection 60mg  Subcutaneous every 6 months 06/16/21   [provider]  diclofenac Sodium (VOLTAREN) 1 % GEL Apply topically. 01/08/23   [provider]  estradiol (ESTRACE) 2 MG tablet Take 2 mg by mouth daily. 11/14/22   [provider]  levothyroxine  (SYNTHROID ) 25 MCG tablet Take 1 tablet (25 mcg total) by mouth daily. 03/29/21   Kennyth Worth HERO, MD  levothyroxine  (SYNTHROID ) 88 MCG tablet Take 88 mcg by mouth daily before breakfast.    [provider]  LORazepam  (ATIVAN ) 1 MG tablet Take 1 tablet (1 mg total) by mouth every 8 (eight) hours. 06/22/23   Kennyth Worth HERO, MD  metoprolol  tartrate (LOPRESSOR ) 100 MG tablet TAKE 1 TABLET TWO HOUR PRIOR TO CARDIAC PROCEDURE 10/02/23   Abigail Bernardino HERO, PA-C  mirtazapine (REMERON) 30 MG tablet Take 30 mg by mouth at bedtime. Patient not taking: Reported on 01/01/2024    [provider]  naloxone  (NARCAN ) nasal spray 4 mg/0.1 mL     [provider]  omeprazole -sodium bicarbonate  (ZEGERID ) 40-1100 MG capsule Take 1 capsule by mouth daily before breakfast. 12/29/22   Armbruster, Elspeth SQUIBB, MD  ondansetron  (ZOFRAN -ODT) 8 MG disintegrating tablet Take 1 tablet by mouth 3 (three) times daily. Take 1 tab by mouth tid 08/03/15   [provider]  Oxycodone  HCl 10 MG TABS Take 10 mg by mouth every 4 (four) hours as needed (pain). 08/26/20   [provider]  progesterone (PROMETRIUM) 200 MG capsule Take 200 mg by mouth daily.    [provider]  rosuvastatin  (CRESTOR ) 20 MG tablet Take 1 tablet (20 mg total) by mouth daily. 11/13/23 02/11/24  Abigail Bernardino HERO, PA-C  traZODone  (DESYREL ) 50 MG tablet Take 50 mg by mouth at bedtime.    [provider]  Vitamin D,  Ergocalciferol, (DRISDOL) 1.25 MG (50000 UNIT) CAPS capsule TAKE 1 CAPSULE BY MOUTH ONE TIME PER WEEK FOR 30 DAYS    [provider]    Physical Exam: Constitutional: Moderately built and nourished. Vitals:   01/08/24 1610 01/08/24 1700 01/08/24 1830 01/08/24 2125  BP: (!) 147/99 (!) 131/92 129/83 (!) 153/100  Pulse: 66 61 68 70  Resp: 17 15 14 16   Temp:  98.5 F (36.9 C)  98.3 F (36.8 C)  TempSrc:  Oral  Oral  SpO2: 97% 97% 96% 97%  Weight:       Eyes: Anicteric no pallor. ENMT: No discharge  from the ears/nose or mouth. Neck: No mass felt.  No neck rigidity. Respiratory: No rhonchi or crepitations. Cardiovascular: S1-S2 heard. Abdomen: Soft nontender bowel sound present. Musculoskeletal: No edema.  Left SLR positive. Skin: No rash. Neurologic: Alert awake oriented to time place and person.  Moves all extremities.  Pain on moving her left upper extremity straight leg raising test. Psychiatric: Appears normal.  Normal affect.   Labs on Admission: I have personally reviewed following labs and imaging studies  CBC: Recent Labs  Lab 01/08/24 1353  WBC 10.0  NEUTROABS 9.1*  HGB 16.5*  HCT 49.7*  MCV 91.0  PLT 209   Basic Metabolic Panel: Recent Labs  Lab 01/08/24 1353  NA 138  K 3.4*  CL 99  CO2 25  GLUCOSE 131*  BUN 12  CREATININE 0.80  CALCIUM  9.4   GFR: Estimated Creatinine Clearance: 48.3 mL/min (by C-G formula based on SCr of 0.8 mg/dL). Liver Function Tests: Recent Labs  Lab 01/08/24 1353  AST 15  ALT 9  ALKPHOS 51  BILITOT 0.8  PROT 7.3  ALBUMIN 4.3   No results for input(s): LIPASE, AMYLASE in the last 168 hours. No results for input(s): AMMONIA in the last 168 hours. Coagulation Profile: No results for input(s): INR, PROTIME in the last 168 hours. Cardiac Enzymes: No results for input(s): CKTOTAL, CKMB, CKMBINDEX, TROPONINI in the last 168 hours. BNP (last 3 results) No results for input(s): PROBNP in the  last 8760 hours. HbA1C: No results for input(s): HGBA1C in the last 72 hours. CBG: No results for input(s): GLUCAP in the last 168 hours. Lipid Profile: No results for input(s): CHOL, HDL, LDLCALC, TRIG, CHOLHDL, LDLDIRECT in the last 72 hours. Thyroid  Function Tests: No results for input(s): TSH, T4TOTAL, FREET4, T3FREE, THYROIDAB in the last 72 hours. Anemia Panel: No results for input(s): VITAMINB12, FOLATE, FERRITIN, TIBC, IRON, RETICCTPCT in the last 72 hours. Urine analysis:    Component Value Date/Time   COLORURINE STRAW (A) 01/08/2024 1353   APPEARANCEUR CLEAR 01/08/2024 1353   LABSPEC 1.006 01/08/2024 1353   PHURINE 7.0 01/08/2024 1353   GLUCOSEU NEGATIVE 01/08/2024 1353   HGBUR NEGATIVE 01/08/2024 1353   BILIRUBINUR NEGATIVE 01/08/2024 1353   BILIRUBINUR neg 08/12/2013 1430   KETONESUR 5 (A) 01/08/2024 1353   PROTEINUR NEGATIVE 01/08/2024 1353   UROBILINOGEN 0.2 08/12/2013 1430   UROBILINOGEN 1.0 07/03/2013 2134   NITRITE NEGATIVE 01/08/2024 1353   LEUKOCYTESUR NEGATIVE 01/08/2024 1353   Sepsis Labs: @LABRCNTIP (procalcitonin:4,lacticidven:4) )No results found for this or any previous visit (from the past 240 hours).   Radiological Exams on Admission: MR LUMBAR SPINE WO CONTRAST Result Date: 01/08/2024 CLINICAL DATA:  back pain, new urinary retention, s/p L4-5 laminectomy 1990 EXAM: MRI LUMBAR SPINE WITHOUT CONTRAST TECHNIQUE: Multiplanar, multisequence MR imaging of the lumbar spine was performed. No intravenous contrast was administered. COMPARISON:  MRI of the lumbar spine dated March 10, 2011. FINDINGS: Segmentation: Transitional anatomy. There is sacralization of L5. 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 Alignment: Mild levo scoliosis of the thoracolumbar spine. Mild straightening of the normal lumbar lordosis. Vertebrae: The vertebral bodies are intact. No osseous lesions present. Conus medullaris and cauda equina: Conus extends to the  upper body of T12. Level. Conus and cauda equina appear normal. Paraspinal and other soft tissues: The paraspinous soft tissues are unremarkable. There is a simple cysts present laterally within the left kidney, which does not require follow-up. Disc levels: T12-L1: Left posterolateral disc bulging and slight craniad extrusion, causing mild left-sided spinal canal  and lateral recess stenosis. No nerve root impingement. L1-2: Central disc bulge that and right paracentral caudal disc extrusion. The extruded disc extends proximally 13 mm beneath the disc space. There is severe central spinal canal stenosis with compression of the nerve roots of the cauda carina. There is also compression of the L2 nerve roots in the lateral recesses bilaterally. L2-3: Moderate disc space narrowing and diffuse disc bulging, with moderate central spinal canal stenosis and right lateral recess stenosis, with impingement of the right L3 nerve in the lateral recess. L3-4: Broad-based disc bulging and bilateral facet hypertrophy, with moderate central spinal canal stenosis and moderate bilateral lateral recess stenosis. There is questionable impingement of the L4 nerves in the lateral recesses bilaterally. L4-5: Broad-based disc bulge and left posterolateral protrusion, which is impinging upon the left L5 nerve in the lateral recess. There is mild central spinal canal stenosis and mild right neural foraminal stenosis. L5-S1: No significant spinal canal or neural foraminal stenosis. IMPRESSION: 1. Broad-based disc bulge with right paracentral caudal disc extrusion at L1-2, no resulting in severe central spinal canal stenosis and impingement of the L2 nerves in the lateral recesses bilaterally. 2. Broad-based disc bulge at L2-3, with moderate central spinal canal stenosis and right lateral recess stenosis, with impingement of the right L3 nerve. 3. Broad-based disc bulge at L4-5 with left posterolateral protrusion, impinging upon the left L4  nerve in the lateral recess. 4. Broad-based disc bulging at L3-4, with questionable impingement of the L4 nerves in the lateral recesses bilaterally. Electronically Signed   By: Evalene Coho M.D.   On: 01/08/2024 16:02     Assessment/Plan Principal Problem:   Low back pain Active Problems:   Chronic neck and back pain secondary to degenerative disc disease   Hypothyroidism   Polycythemia, secondary   Hypokalemia    Lumbar radiculopathy with moderate spinal stenosis with acute on chronic pain -    appreciate neurosurgery recommendations.  Since patient's pain has been worsening despite receiving multiple dose of Dilaudid  admitted for further observation.  Request physical therapy consult.  Will transfer to [facility] and if pain still uncontrolled may reconsult neurosurgery.  Patient is on chronic oxycodone  therapy confirmed with PDMP website.  Will continue that.  Will keep patient on as needed Dilaudid  for breakthrough pain. Hypokalemia replace recheck. Hypothyroidism on Synthroid . Hyperlipidemia on Repatha. Secondary polycythemia being followed by oncologist.  Since patient has acute on chronic back pain will need further management and more than 2 midnight stay.   DVT prophylaxis: SCDs. Code Status: Full code. Family Communication: Discussed with patient's family at the bedside. Disposition Plan: Medical floor. Consults called: ER physician discussed with neurosurgery.  Physical therapy consult. Admission status: Observation.

## 2024-01-08 NOTE — Discharge Instructions (Addendum)
 Advised to follow-up with the spine surgeon as scheduled.

## 2024-01-08 NOTE — Progress Notes (Signed)
 I reviewed history and MRI.  Patient has a L3-4 right paracentral disc herniation. As her stenosis is only moderate, this would not directly contribute to any urinary complaints.  Treatment of lumbar herniated discs with lumbar radiculopathy typically involves at least 6 weeks-3 months of medical management and PT prior to consideration of surgery.  She can f/u in clinic with her previous spine surgeon or in our office in next few weeks.

## 2024-01-08 NOTE — ED Provider Notes (Addendum)
 [place] EMERGENCY DEPARTMENT AT [facility] Provider Note   CSN: [id number] Arrival date & time: 01/08/24  1313     Patient presents with: Urinary Retention   Michaela Hall is a 68 y.o. female.  With a history of chronic back pain status post L3-L4 L4-L5 lumbar laminectomy who presents to the ED for urinary retention.  Patient suffers from chronic back pain.  Back pain has increased after she attempted to lift a heavy chair outdoors 2 weeks ago.  Is on her last day of prednisone  taper.  Urinary retention over the last 24 hours.  Unable to void to completion.  No fevers chills repeated injuries focal weakness or paresthesias.  Back pain localized over lumbar region with radiation down both legs worse on the left leg.   HPI     Prior to Admission medications   Medication Sig Start Date End Date Taking? Authorizing Provider  predniSONE  (STERAPRED UNI-PAK 21 TAB) 10 MG (21) TBPK tablet Take by mouth daily. Take 6 tabs by mouth daily  for 2 days, then 5 tabs for 2 days, then 4 tabs for 2 days, then 3 tabs for 2 days, 2 tabs for 2 days, then 1 tab by mouth daily for 2 days 01/08/24  Yes Pamella Ozell LABOR, DO  aspirin  EC 81 MG tablet Take 1 tablet (81 mg total) by mouth daily. Swallow whole. 05/03/21   Abigail Bernardino HERO, PA-C  Calcium  Carbonate (CALCIUM  500 PO) Take 500 mg by mouth 2 (two) times daily.    [provider]  cyanocobalamin (VITAMIN B12) 1000 MCG/ML injection INJECT ONCE A WEEK FOR 30 DAYS    [provider]  denosumab (PROLIA) 60 MG/ML SOSY injection 60mg  Subcutaneous every 6 months 06/16/21   [provider]  diclofenac Sodium (VOLTAREN) 1 % GEL Apply topically. 01/08/23   [provider]  estradiol (ESTRACE) 2 MG tablet Take 2 mg by mouth daily. 11/14/22   [provider]  levothyroxine  (SYNTHROID ) 25 MCG tablet Take 1 tablet (25 mcg total) by mouth daily. 03/29/21   Kennyth Worth HERO, MD  levothyroxine  (SYNTHROID ) 88 MCG tablet  Take 88 mcg by mouth daily before breakfast.    [provider]  LORazepam  (ATIVAN ) 1 MG tablet Take 1 tablet (1 mg total) by mouth every 8 (eight) hours. 06/22/23   Kennyth Worth HERO, MD  metoprolol  tartrate (LOPRESSOR ) 100 MG tablet TAKE 1 TABLET TWO HOUR PRIOR TO CARDIAC PROCEDURE 10/02/23   Abigail Bernardino HERO, PA-C  mirtazapine (REMERON) 30 MG tablet Take 30 mg by mouth at bedtime. Patient not taking: Reported on 01/01/2024    [provider]  naloxone  (NARCAN ) nasal spray 4 mg/0.1 mL     [provider]  omeprazole -sodium bicarbonate  (ZEGERID ) 40-1100 MG capsule Take 1 capsule by mouth daily before breakfast. 12/29/22   Armbruster, Elspeth SQUIBB, MD  ondansetron  (ZOFRAN -ODT) 8 MG disintegrating tablet Take 1 tablet by mouth 3 (three) times daily. Take 1 tab by mouth tid 08/03/15   [provider]  Oxycodone  HCl 10 MG TABS Take 10 mg by mouth every 4 (four) hours as needed (pain). 08/26/20   [provider]  progesterone (PROMETRIUM) 200 MG capsule Take 200 mg by mouth daily.    [provider]  rosuvastatin  (CRESTOR ) 20 MG tablet Take 1 tablet (20 mg total) by mouth daily. 11/13/23 02/11/24  Abigail Bernardino HERO, PA-C  traZODone  (DESYREL ) 50 MG tablet Take 50 mg by mouth at bedtime.    [provider]  Vitamin D, Ergocalciferol, (DRISDOL) 1.25 MG (50000 UNIT) CAPS capsule TAKE 1 CAPSULE BY MOUTH ONE TIME PER WEEK FOR 30 DAYS    [provider]    Allergies: Gentamycin [gentamicin], Alendronate, and Statins    Review of Systems  Updated Vital Signs BP (!) 147/99 (BP Location: Left Arm)   Pulse 66   Temp 98.2 F (36.8 C) (Oral)   Resp 17   Wt 53.5 kg   SpO2 97%   BMI 23.05 kg/m   Physical Exam Vitals and nursing note reviewed.  HENT:     Head: Normocephalic and atraumatic.  Eyes:     Pupils: Pupils are equal, round, and reactive to light.  Cardiovascular:     Rate and Rhythm: Normal rate and regular rhythm.  Pulmonary:     Effort:  Pulmonary effort is normal.     Breath sounds: Normal breath sounds.  Abdominal:     Palpations: Abdomen is soft.     Tenderness: There is no abdominal tenderness.  Musculoskeletal:     Comments: Sensation intact to light touch throughout 5 out of 5 motor strength bilateral lower extremities Tenderness over midline lumbar and left paraspinal lumbar region without step-off deformity Well-healed old surgical scars over midline lumbar spine  Skin:    General: Skin is warm and dry.  Neurological:     General: No focal deficit present.     Mental Status: She is alert and oriented to person, place, and time.     Sensory: No sensory deficit.     Motor: No weakness.  Psychiatric:        Mood and Affect: Mood normal.     (all labs ordered are listed, but only abnormal results are displayed) Labs Reviewed  COMPREHENSIVE METABOLIC PANEL WITH GFR - Abnormal; Notable for the following components:      Result Value   Potassium 3.4 (*)    Glucose, Bld 131 (*)    All other components within normal limits  CBC WITH DIFFERENTIAL/PLATELET - Abnormal; Notable for the following components:   RBC 5.46 (*)    Hemoglobin 16.5 (*)    HCT 49.7 (*)    Neutro Abs 9.1 (*)    Lymphs Abs 0.6 (*)    All other components within normal limits  URINALYSIS, W/ REFLEX TO CULTURE (INFECTION SUSPECTED) - Abnormal; Notable for the following components:   Color, Urine STRAW (*)    Ketones, ur 5 (*)    Bacteria, UA MANY (*)    All other components within normal limits    EKG: EKG Interpretation Date/Time:  Tuesday January 08 2024 14:20:14 EDT Ventricular Rate:  65 PR Interval:  153 QRS Duration:  87 QT Interval:  398 QTC Calculation: 414 R Axis:   86  Text Interpretation: Sinus rhythm Borderline right axis deviation Confirmed by Pamella Sharper [phone]) on 01/08/2024 3:02:03 PM  Radiology: MR LUMBAR SPINE WO CONTRAST Result Date: 01/08/2024 CLINICAL DATA:  back pain, new urinary retention, s/p L4-5  laminectomy 1990 EXAM: MRI LUMBAR SPINE WITHOUT CONTRAST TECHNIQUE: Multiplanar, multisequence MR imaging of the lumbar spine was performed. No intravenous contrast was administered. COMPARISON:  MRI of the lumbar spine dated March 10, 2011. FINDINGS: Segmentation: Transitional anatomy. There is sacralization of L5. 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 Alignment: Mild levo scoliosis of the thoracolumbar spine. Mild straightening of the normal lumbar lordosis. Vertebrae: The vertebral bodies are intact. No osseous lesions present. Conus medullaris and cauda equina: Conus extends to the upper body of  T12. Level. Conus and cauda equina appear normal. Paraspinal and other soft tissues: The paraspinous soft tissues are unremarkable. There is a simple cysts present laterally within the left kidney, which does not require follow-up. Disc levels: T12-L1: Left posterolateral disc bulging and slight craniad extrusion, causing mild left-sided spinal canal and lateral recess stenosis. No nerve root impingement. L1-2: Central disc bulge that and right paracentral caudal disc extrusion. The extruded disc extends proximally 13 mm beneath the disc space. There is severe central spinal canal stenosis with compression of the nerve roots of the cauda carina. There is also compression of the L2 nerve roots in the lateral recesses bilaterally. L2-3: Moderate disc space narrowing and diffuse disc bulging, with moderate central spinal canal stenosis and right lateral recess stenosis, with impingement of the right L3 nerve in the lateral recess. L3-4: Broad-based disc bulging and bilateral facet hypertrophy, with moderate central spinal canal stenosis and moderate bilateral lateral recess stenosis. There is questionable impingement of the L4 nerves in the lateral recesses bilaterally. L4-5: Broad-based disc bulge and left posterolateral protrusion, which is impinging upon the left L5 nerve in the lateral recess. There is mild central spinal canal  stenosis and mild right neural foraminal stenosis. L5-S1: No significant spinal canal or neural foraminal stenosis. IMPRESSION: 1. Broad-based disc bulge with right paracentral caudal disc extrusion at L1-2, no resulting in severe central spinal canal stenosis and impingement of the L2 nerves in the lateral recesses bilaterally. 2. Broad-based disc bulge at L2-3, with moderate central spinal canal stenosis and right lateral recess stenosis, with impingement of the right L3 nerve. 3. Broad-based disc bulge at L4-5 with left posterolateral protrusion, impinging upon the left L4 nerve in the lateral recess. 4. Broad-based disc bulging at L3-4, with questionable impingement of the L4 nerves in the lateral recesses bilaterally. Electronically Signed   By: Evalene Coho M.D.   On: 01/08/2024 16:02     Procedures   Medications Ordered in the ED  HYDROmorphone  (DILAUDID ) injection 1 mg (1 mg Intravenous Given 01/08/24 1407)  HYDROmorphone  (DILAUDID ) injection 1 mg (1 mg Intravenous Given 01/08/24 1602)    Clinical Course as of 01/08/24 1720  Tue Jan 08, 2024  1657 MRI findings show severe stenosis L1-L2, moderate stenosis L2-L3, disc bulging L4-L5 L3-L4.  Discussed with neurosurgery team who recommends Medrol  Dosepak and office follow-up. [MP]  1714 Reevaluated patient.  She has been able to urinate here.  No new focal neurologic deficits.  I discussed this plan with the patient however she is still in a great deal of pain after 2 doses of Dilaudid  here.  She will require admission to medicine team for pain control.  Will make neurosurgery team aware [MP]    Clinical Course User Index [MP] Pamella Ozell LABOR, DO                                 Medical Decision Making 68 year old female with history as above presenting for acute on chronic back pain and urinary retention.  Subacute injury 2 weeks ago.  Has had increased lumbar pain with left radiculopathy since that time.  24 hours of urinary retention.   No motor, sensory or circulatory deficits on my exam.  Presentation concerning for potential cord injury considering urinary retention and history of known disc disease status post laminectomy many years ago.  Will obtain laboratory workup, bladder scan, Place Foley if retaining and obtain MRI lumbar  spine to look for any evidence of cord compression.  Low suspicion for acute infectious etiology given lack of fever and other systemic symptoms.  Patient took prednisone  earlier today prior to arrival  Amount and/or Complexity of Data Reviewed Labs: ordered. Radiology: ordered.  Risk Prescription drug management.        Final diagnoses:  Spinal stenosis of lumbar region, unspecified whether neurogenic claudication present    ED Discharge Orders          Ordered    predniSONE  (STERAPRED UNI-PAK 21 TAB) 10 MG (21) TBPK tablet  Daily        01/08/24 1700               Pamella Ozell LABOR, DO 01/08/24 1702    Pamella Ozell LABOR, DO 01/08/24 1720

## 2024-01-09 ENCOUNTER — Telehealth: Payer: Self-pay

## 2024-01-09 DIAGNOSIS — M5416 Radiculopathy, lumbar region: Secondary | ICD-10-CM | POA: Diagnosis not present

## 2024-01-09 DIAGNOSIS — Z7982 Long term (current) use of aspirin: Secondary | ICD-10-CM | POA: Diagnosis not present

## 2024-01-09 DIAGNOSIS — E876 Hypokalemia: Secondary | ICD-10-CM | POA: Diagnosis not present

## 2024-01-09 DIAGNOSIS — M5442 Lumbago with sciatica, left side: Secondary | ICD-10-CM | POA: Diagnosis not present

## 2024-01-09 DIAGNOSIS — M545 Low back pain, unspecified: Secondary | ICD-10-CM | POA: Diagnosis present

## 2024-01-09 DIAGNOSIS — D751 Secondary polycythemia: Secondary | ICD-10-CM | POA: Diagnosis not present

## 2024-01-09 DIAGNOSIS — E039 Hypothyroidism, unspecified: Secondary | ICD-10-CM | POA: Diagnosis not present

## 2024-01-09 DIAGNOSIS — M48061 Spinal stenosis, lumbar region without neurogenic claudication: Secondary | ICD-10-CM | POA: Diagnosis not present

## 2024-01-09 DIAGNOSIS — Z87891 Personal history of nicotine dependence: Secondary | ICD-10-CM | POA: Diagnosis not present

## 2024-01-09 LAB — CBC
HCT: 49.5 % — ABNORMAL HIGH (ref 36.0–46.0)
Hemoglobin: 16.3 g/dL — ABNORMAL HIGH (ref 12.0–15.0)
MCH: 30.2 pg (ref 26.0–34.0)
MCHC: 32.9 g/dL (ref 30.0–36.0)
MCV: 91.7 fL (ref 80.0–100.0)
Platelets: 167 K/uL (ref 150–400)
RBC: 5.4 MIL/uL — ABNORMAL HIGH (ref 3.87–5.11)
RDW: 13.2 % (ref 11.5–15.5)
WBC: 9.8 K/uL (ref 4.0–10.5)
nRBC: 0 % (ref 0.0–0.2)

## 2024-01-09 LAB — BASIC METABOLIC PANEL WITH GFR
Anion gap: 11 (ref 5–15)
BUN: 14 mg/dL (ref 8–23)
CO2: 24 mmol/L (ref 22–32)
Calcium: 9 mg/dL (ref 8.9–10.3)
Chloride: 102 mmol/L (ref 98–111)
Creatinine, Ser: 0.91 mg/dL (ref 0.44–1.00)
GFR, Estimated: >60 mL/min (ref >60)
Glucose, Bld: 105 mg/dL — ABNORMAL HIGH (ref 70–99)
Potassium: 4.3 mmol/L (ref 3.5–5.1)
Sodium: 137 mmol/L (ref 135–145)

## 2024-01-09 LAB — HIV ANTIBODY (ROUTINE TESTING W REFLEX): HIV Screen 4th Generation wRfx: NONREACTIVE

## 2024-01-09 MED ORDER — ORAL CARE MOUTH RINSE: 15.0000 mL | OROMUCOSAL | Status: DC | PRN

## 2024-01-09 MED ORDER — HYDROMORPHONE HCL 1 MG/ML IJ SOLN
0.5000 mg | INTRAMUSCULAR | Status: DC | PRN
  Administered 2024-01-09 (×3): 0.5 mg via INTRAVENOUS
  Filled 2024-01-09 (×3): qty 0.5

## 2024-01-09 MED ORDER — NALOXONE HCL 0.4 MG/ML IJ SOLN: 0.4000 mg | INTRAMUSCULAR | Status: DC | PRN

## 2024-01-09 NOTE — Plan of Care (Signed)
  Problem: Activity: Goal: Risk for activity intolerance will decrease Outcome: Not Progressing   Problem: Coping: Goal: Level of anxiety will decrease Outcome: Not Progressing   Problem: Pain Managment: Goal: General experience of comfort will improve and/or be controlled Outcome: Not Progressing

## 2024-01-09 NOTE — Discharge Summary (Signed)
 Physician Discharge Summary  Michaela Hall FMW:[id number] DOB: 1955/12/15 DOA: 01/08/2024  PCP: Kennyth Worth HERO, MD  Admit date: 01/08/2024  Discharge date: 01/09/2024  Admitted From: Home  Disposition:  Home  Recommendations for Outpatient Follow-up:  Follow up with PCP in 1-2 weeks. Please obtain BMP/CBC in one week. Advised to follow-up with the spine surgeon as scheduled.  Home Health:None Equipment/Devices:NOne  Discharge Condition: Stable CODE STATUS:Full code Diet recommendation: Heart Healthy   Brief [facility] Course: This 68 y.o. female with history of chronic back pain, hypothyroidism, hyperlipidemia, secondary polycythemia presents to the ER after patient had worsening low back pain and was advised by patient's orthopedic office to come to the ER.  Patient has been having worsening low back pain for the last 3 weeks after she had a fall and hurt her back and has been following with Dr. Bonner at the orthopedic office.  Patient was started on prednisone  course for the last 1 week, last dose is due for tomorrow.  Today patient stated that she also had worsening pain with some urinary retention and was instructed to come to the ER.  Pain is mostly in the low back radiating to her left lower extremity.  Denies any fever or chills.   MRI of the L-spine was done which was reviewed by neurosurgeon Dr. Dorn Ned who felt that patient's picture showed features concerning for lumbar radiculopathy with moderate spinal stenosis and requested medical management with physical therapy.  Since patient was having worsening pain was given additional dose of Dilaudid  and as per ER physician discussion with the neurosurgeon if pain was uncontrolled to admit for observation.  Patient was evaluated by neurosurgeon recommended outpatient follow-up with the spine surgeon.  Recommended PT and pain control.  Patient reports pain is reasonably controlled and she wants to be  discharged.   Discharge Diagnoses:  Principal Problem:   Low back pain Active Problems:   Chronic neck and back pain secondary to degenerative disc disease   Hypothyroidism   Polycythemia, secondary   Hypokalemia  Discharge Instructions  Discharge Instructions     Ambulatory referral to Physical Therapy   Complete by: As directed    Call MD for:  difficulty breathing, headache or visual disturbances   Complete by: As directed    Call MD for:  persistant dizziness or light-headedness   Complete by: As directed    Call MD for:  persistant nausea and vomiting   Complete by: As directed    Diet - low sodium heart healthy   Complete by: As directed    Diet general   Complete by: As directed    Discharge instructions   Complete by: As directed    Advised to follow-up with primary care physician in 1 week. Advised to follow-up with his spine surgeon as scheduled.   Increase activity slowly   Complete by: As directed       Allergies as of 01/09/2024       Reactions   Gentamycin [gentamicin] Anaphylaxis   Alendronate    Other Reaction(s): Pain all over body   Statins    Other Reaction(s): myalgias        Medication List     STOP taking these medications    metoprolol  tartrate 100 MG tablet Commonly known as: LOPRESSOR    mirtazapine 30 MG tablet Commonly known as: REMERON   Movantik 25 MG Tabs tablet Generic drug: naloxegol oxalate       TAKE these medications  aspirin  EC 81 MG tablet Take 1 tablet (81 mg total) by mouth daily. Swallow whole.   CALCIUM  500 PO Take 1,200 mg by mouth 2 (two) times daily.   cyanocobalamin 1000 MCG/ML injection Commonly known as: VITAMIN B12 INJECT ONCE A WEEK FOR 30 DAYS   diclofenac Sodium 1 % Gel Commonly known as: VOLTAREN Apply topically.   estradiol 2 MG tablet Commonly known as: ESTRACE Take 2 mg by mouth daily.   levothyroxine  88 MCG tablet Commonly known as: SYNTHROID  Take 88 mcg by mouth daily  before breakfast. What changed: Another medication with the same name was removed. Continue taking this medication, and follow the directions you see here.   LORazepam  1 MG tablet Commonly known as: Ativan  Take 1 tablet (1 mg total) by mouth every 8 (eight) hours. What changed: when to take this   Narcan  4 MG/0.1ML Liqd nasal spray kit Generic drug: naloxone    omeprazole  40 MG capsule Commonly known as: PRILOSEC Take 40 mg by mouth as needed (Acid reflux).   omeprazole -sodium bicarbonate  40-1100 MG capsule Commonly known as: Zegerid  Take 1 capsule by mouth daily before breakfast. What changed:  when to take this reasons to take this   ondansetron  8 MG disintegrating tablet Commonly known as: ZOFRAN -ODT Take 1 tablet by mouth 3 (three) times daily. Take 1 tab by mouth tid   Oxycodone  HCl 10 MG Tabs Take 10 mg by mouth every 4 (four) hours as needed (pain).   predniSONE  10 MG (21) Tbpk tablet Commonly known as: STERAPRED UNI-PAK 21 TAB Take by mouth daily. Take 6 tabs by mouth daily  for 2 days, then 5 tabs for 2 days, then 4 tabs for 2 days, then 3 tabs for 2 days, 2 tabs for 2 days, then 1 tab by mouth daily for 2 days   progesterone 200 MG capsule Commonly known as: PROMETRIUM Take 200 mg by mouth daily.   Prolia 60 MG/ML Sosy injection Generic drug: denosumab 60mg  Subcutaneous every 6 months   Repatha 140 MG/ML Sosy Generic drug: Evolocumab Inject 1 mL into the skin every 14 (fourteen) days.   rosuvastatin  20 MG tablet Commonly known as: CRESTOR  Take 1 tablet (20 mg total) by mouth daily.   traZODone  50 MG tablet Commonly known as: DESYREL  Take 50 mg by mouth at bedtime as needed for sleep.   Vitamin D (Ergocalciferol) 1.25 MG (50000 UNIT) Caps capsule Commonly known as: DRISDOL TAKE 1 CAPSULE BY MOUTH ONE TIME PER WEEK FOR 30 DAYS        Follow-up Information     Debby Dorn MATSU, MD. Schedule an appointment as soon as possible for a visit in 1 week.    Specialty: Neurosurgery Contact information: [address] [place] [address] [phone]         Kennyth Worth HERO, MD .   Specialty: [facility] Contact information: [address] [place] [address] [phone]         [facility].   Specialty: Specialist Why: Referral made for outpatient PT services. Please call and arrange appointment  time. Contact information: [address] [place] [address] [phone]                Allergies  Allergen Reactions   Gentamycin [Gentamicin] Anaphylaxis   Alendronate     Other Reaction(s): Pain all over body   Statins     Other Reaction(s): myalgias    Consultations: Neurosurgery   Procedures/Studies: MR  LUMBAR SPINE WO CONTRAST Result Date: 01/08/2024 CLINICAL DATA:  back pain, new urinary retention, s/p L4-5 laminectomy 1990 EXAM: MRI LUMBAR SPINE WITHOUT CONTRAST TECHNIQUE: Multiplanar, multisequence MR imaging of the lumbar spine was performed. No intravenous contrast was administered. COMPARISON:  MRI of the lumbar spine dated March 10, 2011. FINDINGS: Segmentation: Transitional anatomy. There is sacralization of L5. 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 Alignment: Mild levo scoliosis of the thoracolumbar spine. Mild straightening of the normal lumbar lordosis. Vertebrae: The vertebral bodies are intact. No osseous lesions present. Conus medullaris and cauda equina: Conus extends to the upper body of T12. Level. Conus and cauda equina appear normal. Paraspinal and other soft tissues: The paraspinous soft tissues are unremarkable. There is a simple cysts present laterally within the left kidney, which does not require follow-up. Disc levels: T12-L1: Left posterolateral disc bulging and slight craniad extrusion, causing mild left-sided spinal canal and lateral recess stenosis. No nerve root impingement. L1-2: Central disc bulge that and right paracentral caudal disc extrusion.  The extruded disc extends proximally 13 mm beneath the disc space. There is severe central spinal canal stenosis with compression of the nerve roots of the cauda carina. There is also compression of the L2 nerve roots in the lateral recesses bilaterally. L2-3: Moderate disc space narrowing and diffuse disc bulging, with moderate central spinal canal stenosis and right lateral recess stenosis, with impingement of the right L3 nerve in the lateral recess. L3-4: Broad-based disc bulging and bilateral facet hypertrophy, with moderate central spinal canal stenosis and moderate bilateral lateral recess stenosis. There is questionable impingement of the L4 nerves in the lateral recesses bilaterally. L4-5: Broad-based disc bulge and left posterolateral protrusion, which is impinging upon the left L5 nerve in the lateral recess. There is mild central spinal canal stenosis and mild right neural foraminal stenosis. L5-S1: No significant spinal canal or neural foraminal stenosis. IMPRESSION: 1. Broad-based disc bulge with right paracentral caudal disc extrusion at L1-2, no resulting in severe central spinal canal stenosis and impingement of the L2 nerves in the lateral recesses bilaterally. 2. Broad-based disc bulge at L2-3, with moderate central spinal canal stenosis and right lateral recess stenosis, with impingement of the right L3 nerve. 3. Broad-based disc bulge at L4-5 with left posterolateral protrusion, impinging upon the left L4 nerve in the lateral recess. 4. Broad-based disc bulging at L3-4, with questionable impingement of the L4 nerves in the lateral recesses bilaterally. Electronically Signed   By: Evalene Coho M.D.   On: 01/08/2024 16:02     Subjective: Patient was seen and examined at bedside.  Overnight events noted. Patient reporting much better , pain is controlled and she wants to be discharged.  Discharge Exam: Vitals:   01/09/24 0504 01/09/24 0928  BP: 105/67 133/79  Pulse: 70 69  Resp:  15 16  Temp: 98.6 F (37 C) 99 F (37.2 C)  SpO2: 98% 98%   Vitals:   01/08/24 2125 01/09/24 0102 01/09/24 0504 01/09/24 0928  BP: (!) 153/100 (!) 176/97 105/67 133/79  Pulse: 70 67 70 69  Resp: 16 15 15 16   Temp: 98.3 F (36.8 C) 97.9 F (36.6 C) 98.6 F (37 C) 99 F (37.2 C)  TempSrc: Oral Oral  Oral  SpO2: 97% 98% 98% 98%  Weight:        General: Pt is alert, awake, not in acute distress Cardiovascular: RRR, S1/S2 +, no rubs, no gallops Respiratory: CTA bilaterally, no wheezing, no rhonchi Abdominal: Soft, NT, ND, bowel sounds + Extremities:  no edema, no cyanosis    The results of significant diagnostics from this hospitalization (including imaging, microbiology, ancillary and laboratory) are listed below for reference.     Microbiology: No results found for this or any previous visit (from the past 240 hours).   Labs: BNP (last 3 results) No results for input(s): BNP in the last 8760 hours. Basic Metabolic Panel: Recent Labs  Lab 01/08/24 1353 01/09/24 0537  NA 138 137  K 3.4* 4.3  CL 99 102  CO2 25 24  GLUCOSE 131* 105*  BUN 12 14  CREATININE 0.80 0.91  CALCIUM  9.4 9.0   Liver Function Tests: Recent Labs  Lab 01/08/24 1353  AST 15  ALT 9  ALKPHOS 51  BILITOT 0.8  PROT 7.3  ALBUMIN 4.3   No results for input(s): LIPASE, AMYLASE in the last 168 hours. No results for input(s): AMMONIA in the last 168 hours. CBC: Recent Labs  Lab 01/08/24 1353 01/09/24 0537  WBC 10.0 9.8  NEUTROABS 9.1*  --   HGB 16.5* 16.3*  HCT 49.7* 49.5*  MCV 91.0 91.7  PLT 209 167   Cardiac Enzymes: No results for input(s): CKTOTAL, CKMB, CKMBINDEX, TROPONINI in the last 168 hours. BNP: Invalid input(s): POCBNP CBG: No results for input(s): GLUCAP in the last 168 hours. D-Dimer No results for input(s): DDIMER in the last 72 hours. Hgb A1c No results for input(s): HGBA1C in the last 72 hours. Lipid Profile No results for input(s):  CHOL, HDL, LDLCALC, TRIG, CHOLHDL, LDLDIRECT in the last 72 hours. Thyroid  function studies No results for input(s): TSH, T4TOTAL, T3FREE, THYROIDAB in the last 72 hours.  Invalid input(s): FREET3 Anemia work up No results for input(s): VITAMINB12, FOLATE, FERRITIN, TIBC, IRON, RETICCTPCT in the last 72 hours. Urinalysis    Component Value Date/Time   COLORURINE STRAW (A) 01/08/2024 1353   APPEARANCEUR CLEAR 01/08/2024 1353   LABSPEC 1.006 01/08/2024 1353   PHURINE 7.0 01/08/2024 1353   GLUCOSEU NEGATIVE 01/08/2024 1353   HGBUR NEGATIVE 01/08/2024 1353   BILIRUBINUR NEGATIVE 01/08/2024 1353   BILIRUBINUR neg 08/12/2013 1430   KETONESUR 5 (A) 01/08/2024 1353   PROTEINUR NEGATIVE 01/08/2024 1353   UROBILINOGEN 0.2 08/12/2013 1430   UROBILINOGEN 1.0 07/03/2013 2134   NITRITE NEGATIVE 01/08/2024 1353   LEUKOCYTESUR NEGATIVE 01/08/2024 1353   Sepsis Labs Recent Labs  Lab 01/08/24 1353 01/09/24 0537  WBC 10.0 9.8   Microbiology No results found for this or any previous visit (from the past 240 hours).   Time coordinating discharge: Over 30 minutes  SIGNED:   Darcel Dawley, MD  [facility] 01/09/2024, 2:42 PM Pager   If 7PM-7AM, please contact night-coverage

## 2024-01-09 NOTE — Evaluation (Signed)
 Physical Therapy Brief Evaluation and Discharge Note Patient Details Name: Michaela Hall MRN: [id number] DOB: Dec 23, 1955 Today's Date: 01/09/2024   History of Present Illness  Michaela Hall is a 68 y.o. female who presented to [facility] ED 01/08/24 for low back pain. MRI of the L-spine showed features concerning for lumbar radiculopathy with moderate spinal stenosis. PMHx: chronic back pain, hypothyroidism, hyperlipidemia, osteoporosis, and secondary polycythemia.   Clinical Impression  Pt greeted supine in bed, pleasant and agreeable to PT evaluation. PTA, pt was independent to modI for functional mobility, ADLs, IADLs, and driving. She injured her back ~3 weeks ago from lifting a piece of furniture and has been severely limited by pain. Pt reports she has been practically bed ridden and required increased assist from her husband. She lives in a two story house with 2 STE and a flight of stairs with bilateral handrails to the bedrooms and full bathroom. Overall pt was modI for functional mobility, taking increased time to complete tasks. She performed bed mobility using the log roll technique and transfers/gait without an AD. Pt ambulated slowly within hallway without LOB. She completed two flights of stairs with unilateral UE support on handrail. Pt appears to be very close to her baseline function, no further acute PT needs. Recommend OPPT to address chronic low back pain, increase strength, improve balance, and maximize independence and safety with functional mobility.    PT Assessment All further PT needs can be met in the next venue of care  Assistance Needed at Discharge  PRN    Equipment Recommendations None recommended by PT  Recommendations for Other Services       Precautions/Restrictions Precautions Precautions: Fall Recall of Precautions/Restrictions: Intact Restrictions Weight Bearing Restrictions Per Provider Order: No        Mobility  Bed Mobility Rolling:  Independent Supine/Sidelying to sit: Modified independent (Device/Increased time), Used rails Sit to supine/sidelying: Modified independent (Device/Increased time) General bed mobility comments: Pt performed bed mobility from a flat surface. She demonstrated the log roll technique and utilized bed rail.  Transfers Overall transfer level: Modified independent Equipment used: None               General transfer comment: Pt stood from lowest bed height. She pushed up with BUE support. Pt slowly transitioned into standing and took time to stabilize when upright. Good eccentric control with sitting.    Ambulation/Gait Ambulation/Gait assistance: Modified independent (Device/Increase time) Gait Distance (Feet): 250 Feet Assistive device: None Gait Pattern/deviations: Step-through pattern, Decreased stride length, Decreased step length - right, Decreased step length - left Gait Speed: Pace WFL General Gait Details: Pt ambulated with a reciprocal gait pattern, even weight shift, and good foot clearence. She took small steps and had limited arm swing. No LOB.  Home Activity Instructions    Stairs Stairs: Yes Stairs assistance: Modified independent (Device/Increase time) Stair Management: One rail Right, Forwards, Alternating pattern Number of Stairs: 20 General stair comments: Pt ascended/descended two flights of stairs with a reciprocal gait pattern. She maintained unilat UE support on railing and utilized arm to pull her up the stairs. No LOB.  Modified Rankin (Stroke Patients Only)        Balance Overall balance assessment: Modified Independent                        Pertinent Vitals/Pain PT - Brief Vital Signs All Vital Signs Stable: Yes Pain Assessment Pain Assessment: 0-10 Pain Score: 9  Pain Location: Low  Back mainly on L-side. Pain Descriptors / Indicators: Shooting, Sharp, Grimacing, Guarding, Discomfort Pain Intervention(s): Premedicated before session,  Monitored during session     Home Living Family/patient expects to be discharged to:: Private residence Living Arrangements: Spouse/significant other Available Help at Discharge: Family;Available 24 hours/day Home Environment: Stairs to enter;No rail;Stairs in home;Rail - right;Rail - left  Stairs-Number of Steps: 2 STE without rails; 16 to upstairs with bilateral handrails Home Equipment: Cane - single point;Rolling Walker (2 wheels)        Prior Function Level of Independence: Independent Comments: In the past 3 weeks since injuring her back from lifting a piece of furniture she reports being practically bed ridden d/t severe pain. Pt has required increased assist from her husband with functional mobility, ADLs, and IADLs. When her back pain is better managed she is independent to modI for mobility and ADLs/IADLs. Pt ambulates without an AD but intermittent furniture walks and heavily relies on UEs to complete stairs. She reports fainting ~6 times in the past 60mo and is currently being worked up for this by a cardiologist. Pt manages her own medications. She cooks, cleans, and completes household duties within her back pain tolerance.    UE/LE Assessment   UE ROM/Strength/Tone/Coordination: [facility]    LE ROM/Strength/Tone/Coordination: [facility]      Communication   Communication Communication: No apparent difficulties     Cognition Overall Cognitive Status: Appears within functional limits for tasks assessed/performed       General Comments      Exercises     Assessment/Plan    PT Problem List Pain;Decreased strength;Decreased activity tolerance;Decreased balance;Decreased mobility       PT Visit Diagnosis Pain;Other symptoms and signs involving the nervous system (R29.898);Other abnormalities of gait and mobility (R26.89)    No Skilled PT     Co-evaluation                AMPAC 6 Clicks Help needed turning from your back to your side while in a flat bed without  using bedrails?: None Help needed moving from lying on your back to sitting on the side of a flat bed without using bedrails?: None Help needed moving to and from a bed to a chair (including a wheelchair)?: None Help needed standing up from a chair using your arms (e.g., wheelchair or bedside chair)?: None Help needed to walk in hospital room?: None Help needed climbing 3-5 steps with a railing? : None 6 Click Score: 24      End of Session   Activity Tolerance: Patient tolerated treatment well Patient left: in bed;with call bell/phone within reach Nurse Communication: Mobility status PT Visit Diagnosis: Pain;Other symptoms and signs involving the nervous system (R29.898);Other abnormalities of gait and mobility (R26.89) Pain - Right/Left: Left Pain - part of body: Leg (Back)     Time: 9141-9085 PT Time Calculation (min) (ACUTE ONLY): 16 min  Charges:   PT Evaluation $PT Eval Low Complexity: 1 Low      Randall SAUNDERS, PT, DPT Acute Rehabilitation Services Office: [phone] Secure Chat Preferred  Delon CHRISTELLA Callander  01/09/2024, 9:54 AM

## 2024-01-09 NOTE — Progress Notes (Signed)
 TRH Note:  Prn IV Dilaudid  added for breakthrough discomfort on prn oxycodone .    Eva Pore, DO Hospitalist

## 2024-01-09 NOTE — Progress Notes (Signed)
 Patient is discharging to home via private transportation, iv access removed, home meds were delivered to room from pharmacy discharge packet reviewed at bedside.

## 2024-01-09 NOTE — Progress Notes (Signed)
 Mobility Specialist Progress Note:    01/09/24 1204  Mobility  Activity Ambulated with assistance (In hallway)  Level of Assistance Modified independent, requires aide device or extra time  Assistive Device None  Distance Ambulated (ft) 150 ft  Activity Response Tolerated well  Mobility Referral Yes  Mobility visit 1 Mobility  Mobility Specialist Start Time (ACUTE ONLY) 1116  Mobility Specialist Stop Time (ACUTE ONLY) 1125  Mobility Specialist Time Calculation (min) (ACUTE ONLY) 9 min   Received pt in bed and agreeable to mobility. No physical assistance needed. C/o back pain, otherwise tolerated well. Returned to room and left in bed. Personal belongings and call light within reach. All needs met.  Lavanda Pollack Mobility Specialist  Please contact via [employer] or  Rehab Office [phone]

## 2024-01-09 NOTE — Telephone Encounter (Signed)
 Transition Care Management Unsuccessful Follow-up Telephone Call  Date of discharge and from where:  01/09/24 [facility]  Attempts:  1st Attempt  Reason for unsuccessful TCM follow-up call:  Left voice message

## 2024-01-09 NOTE — TOC Transition Note (Signed)
 Transition of Care [facility]) - Discharge Note   Patient Details  Name: Michaela Hall MRN: [id number] Date of Birth: 20-Oct-1955  [facility] CM/SW Contact:  Rosalva Jon Bloch, RN Phone Number: 01/09/2024, 10:19 AM   Clinical Narrative:    Patient will DC to: home Anticipated DC date: 01/09/2024 Family notified: yes Transport by: car     - lumbar radiculopathy / spinal stenosis  Per MD patient ready for DC today. RN, patient, patient's husband notified of DC.   Outpatient PT referral made with [facility]. Orders faxed to [phone]. Pt without DME needs. Post hospital follow up noted on AVS. Pt without RX med concerns. Husband to provide transportation to home.  RNCM will sign off for now as intervention is no longer needed. Please consult us  again if new needs arise.   Final next level of care: Home/Self Care Barriers to Discharge: No Barriers Identified   Patient Goals and CMS Choice            Discharge Placement                       Discharge Plan and Services Additional resources added to the After Visit Summary for                                       Social Drivers of Health (SDOH) Interventions SDOH Screenings   Food Insecurity: No Food Insecurity (01/09/2024)  Housing: Low Risk  (01/09/2024)  Transportation Needs: No Transportation Needs (01/09/2024)  Utilities: Not At Risk (01/09/2024)  Depression (PHQ2-9): Low Risk  (06/29/2023)  Financial Resource Strain: Low Risk  (06/20/2023)  Physical Activity: Inactive (06/20/2023)  Social Connections: Moderately Integrated (01/09/2024)  Stress: Stress Concern Present (06/20/2023)  Tobacco Use: Medium Risk (01/08/2024)  Health Literacy: Adequate Health Literacy (06/20/2023)     Readmission Risk Interventions     No data to display

## 2024-01-09 NOTE — Consult Note (Signed)
 Providing Compassionate, Quality Care - Together  Neurosurgery Consult  Referring physician: EDP Reason for referral: LBP    History of Present Illness: Pt reports a h/o chronic pain, with an increase a few weeks ago w/ ass'd pain to R hip and LLE posteriorly to behind knee. Spontaneous in onset. She has a h/o lumbar lami by Dr. Leeann years ago. She received guided spinal injections with Dr. Bonner. She does feel as if she may have had some trouble emptying her bladder over the last few weeks. No bowel dysfunction, SA, LE weakness/numbness, recent falls/gait change/balance issues.   Physical Exam:  Vital signs in last 24 hours: Temp:  [98 F (36.7 C)-98.3 F (36.8 C)] 98 F (36.7 C) (07/25 1814) Pulse Rate:  [58-128] 65 (07/26 0746) Resp:  [11-18] 14 (07/26 0217) BP: (138-182)/(65-125) 153/88 (07/26 0700) SpO2:  [91 %-98 %] 96 % (07/26 0746)  PE: Awake, alert, oriented Speech fluent, appropriate 5/5 BUE/BLE Sensation intact x4 DTR decreased BLE, wnl BUE Gait normal  MR LUMBAR SPINE WO CONTRAST Result Date: 01/08/2024 CLINICAL DATA:  back pain, new urinary retention, s/p L4-5 laminectomy 1990 EXAM: MRI LUMBAR SPINE WITHOUT CONTRAST TECHNIQUE: Multiplanar, multisequence MR imaging of the lumbar spine was performed. No intravenous contrast was administered. COMPARISON:  MRI of the lumbar spine dated March 10, 2011. FINDINGS: Segmentation: Transitional anatomy. There is sacralization of L5. 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 Alignment: Mild levo scoliosis of the thoracolumbar spine. Mild straightening of the normal lumbar lordosis. Vertebrae: The vertebral bodies are intact. No osseous lesions present. Conus medullaris and cauda equina: Conus extends to the upper body of T12. Level. Conus and cauda equina appear normal. Paraspinal and other soft tissues: The paraspinous soft tissues are unremarkable. There is a simple cysts present laterally within the left kidney, which does not require  follow-up. Disc levels: T12-L1: Left posterolateral disc bulging and slight craniad extrusion, causing mild left-sided spinal canal and lateral recess stenosis. No nerve root impingement. L1-2: Central disc bulge that and right paracentral caudal disc extrusion. The extruded disc extends proximally 13 mm beneath the disc space. There is severe central spinal canal stenosis with compression of the nerve roots of the cauda carina. There is also compression of the L2 nerve roots in the lateral recesses bilaterally. L2-3: Moderate disc space narrowing and diffuse disc bulging, with moderate central spinal canal stenosis and right lateral recess stenosis, with impingement of the right L3 nerve in the lateral recess. L3-4: Broad-based disc bulging and bilateral facet hypertrophy, with moderate central spinal canal stenosis and moderate bilateral lateral recess stenosis. There is questionable impingement of the L4 nerves in the lateral recesses bilaterally. L4-5: Broad-based disc bulge and left posterolateral protrusion, which is impinging upon the left L5 nerve in the lateral recess. There is mild central spinal canal stenosis and mild right neural foraminal stenosis. L5-S1: No significant spinal canal or neural foraminal stenosis. IMPRESSION: 1. Broad-based disc bulge with right paracentral caudal disc extrusion at L1-2, no resulting in severe central spinal canal stenosis and impingement of the L2 nerves in the lateral recesses bilaterally. 2. Broad-based disc bulge at L2-3, with moderate central spinal canal stenosis and right lateral recess stenosis, with impingement of the right L3 nerve. 3. Broad-based disc bulge at L4-5 with left posterolateral protrusion, impinging upon the left L4 nerve in the lateral recess. 4. Broad-based disc bulging at L3-4, with questionable impingement of the L4 nerves in the lateral recesses bilaterally. Electronically Signed   By: Evalene Hugh HERO.D.  On: 01/08/2024 16:02      Impression/Assessment:  This is a 68 yo female with multilevel degenerative lumbar spondylosis with new L1-2 disc herniation which is likely new symptomatic component   Plan:  -She prefers to f/u w/ her physiatrist Dr. Bonner for further conservative mgmt. I agree that this is appropriate pending she does not experience any new neurologic decline. She will return to ED if such change occurs. Questions encouraged/answered.   Fallon Howerter CAYLIN Nerea Bordenave, PA-C

## 2024-01-11 ENCOUNTER — Ambulatory Visit: Attending: Nurse Practitioner | Admitting: Nurse Practitioner

## 2024-01-11 ENCOUNTER — Telehealth: Payer: Self-pay

## 2024-01-11 ENCOUNTER — Encounter: Payer: Self-pay | Admitting: Nurse Practitioner

## 2024-01-11 VITALS — BP 100/60 | HR 79 | Ht 60.0 in | Wt 118.0 lb

## 2024-01-11 DIAGNOSIS — I6523 Occlusion and stenosis of bilateral carotid arteries: Secondary | ICD-10-CM | POA: Diagnosis present

## 2024-01-11 DIAGNOSIS — R002 Palpitations: Secondary | ICD-10-CM | POA: Diagnosis present

## 2024-01-11 DIAGNOSIS — R072 Precordial pain: Secondary | ICD-10-CM | POA: Diagnosis not present

## 2024-01-11 DIAGNOSIS — I25119 Atherosclerotic heart disease of native coronary artery with unspecified angina pectoris: Secondary | ICD-10-CM | POA: Diagnosis not present

## 2024-01-11 DIAGNOSIS — R55 Syncope and collapse: Secondary | ICD-10-CM | POA: Diagnosis present

## 2024-01-11 DIAGNOSIS — I951 Orthostatic hypotension: Secondary | ICD-10-CM | POA: Insufficient documentation

## 2024-01-11 DIAGNOSIS — D751 Secondary polycythemia: Secondary | ICD-10-CM | POA: Diagnosis present

## 2024-01-11 DIAGNOSIS — Z87898 Personal history of other specified conditions: Secondary | ICD-10-CM | POA: Insufficient documentation

## 2024-01-11 NOTE — Patient Instructions (Signed)
 Medication Instructions:   Your physician recommends that you continue on your current medications as directed. Please refer to the Current Medication list given to you today.   *If you need a refill on your cardiac medications before your next appointment, please call your pharmacy*  Lab Work:  No labs ordered today   If you have labs (blood work) drawn today and your tests are completely normal, you will receive your results only by: MyChart Message (if you have MyChart) OR A paper copy in the mail If you have any lab test that is abnormal or we need to change your treatment, we will call you to review the results.  Testing/Procedures:  No test ordered today   Follow-Up: At [facility], you and your health needs are our priority.  As part of our continuing mission to provide you with exceptional heart care, our providers are all part of one team.  This team includes your primary Cardiologist (physician) and Advanced Practice Providers or APPs (Physician Assistants and Nurse Practitioners) who all work together to provide you with the care you need, when you need it.  Your next appointment:   3 month(s)  Provider:   You may see Lonni Hanson, MD or one of the following Advanced Practice Providers on your designated Care Team:   Lonni Meager, NP

## 2024-01-11 NOTE — Telephone Encounter (Signed)
 Transition Care Management Follow-up Telephone Call Date of discharge and from where: 01/09/24 [facility] How have you been since you were released from the hospital? Same Any questions or concerns? No  Items Reviewed: Did the pt receive and understand the discharge instructions provided? Yes  Medications obtained and verified? Yes  Other? NO Any new allergies since your discharge? No  Dietary orders reviewed? No Do you have support at home? Yes   Home Care and Equipment/Supplies: Were home health services ordered? no If so, what is the name of the agency?   Has the agency set up a time to come to the patient's home? not applicable Were any new equipment or medical supplies ordered?  No What is the name of the medical supply agency?  Were you able to get the supplies/equipment? not applicable Do you have any questions related to the use of the equipment or supplies? No  Functional Questionnaire: (I = Independent and D = Dependent) ADLs: I  Bathing/Dressing- I  Meal Prep- I  Eating- I  Maintaining continence- I  Transferring/Ambulation- I  Managing Meds- I  Follow up appointments reviewed:  PCP Hospital f/u appt confirmed? Yes  Scheduled to see Worth Kitty on 01/28/24 @ 1:20pm. Sooner appt offered and patient declined due to conflict in schedule. Specialist Hospital f/u appt confirmed? Yes  Scheduled to see Ramos on 01/12/24 . Are transportation arrangements needed? No  If their condition worsens, is the pt aware to call PCP or go to the Emergency Dept.? Yes Was the patient provided with contact information for the PCP's office or ED? Yes Was to pt encouraged to call back with questions or concerns? Yes

## 2024-01-11 NOTE — Progress Notes (Signed)
 Office Visit    Patient Name: [facility] Date of Encounter: 01/11/2024  Primary Care Provider:  Kennyth Worth HERO, MD Primary Cardiologist:  Lonni Hanson, MD  Cardiology APP:  Vivienne Lonni Ingle, NP   Chief Complaint    68 y.o. female with a history of chest pain, nonobstructive CAD, orthostatic hypotension with presyncope/syncope, diastolic dysfunction, palpitations, minimal bilateral carotid arterial disease, polycythemia, hypothyroidism, gastroparesis with chronic constipation and chronic narcotic use, lumbar postlaminectomy syndrome with chronic pain, degenerative disc disease, Raynaud's phenomena, osteoarthritis, anxiety, and depression, who presents for follow-up related to chest pain.  Past Medical History   Subjective   Past Medical History:  Diagnosis Date   Anxiety    CAD (non-obstructive)    a. 08/2015 MV: EF 64%, no isch/infarct; b. 04/2021 Cor CTA: Ca2+ = 31.9 (69th%'ile). LAD 25-49p ; c. 10/2023 Cor CTA: Ca2+ = 113 (79th%'ile). LAD 25-49p.   Carotid arterial disease (HCC)    a. 01/2023 Carotid U/S: 1-39% bilat ICA stenoses.   Cervical spondylosis without myelopathy 10/21/2015   Cervical spondylosis without myelopathy 10/21/2015   Chronic pain    back and right hip   Complication of anesthesia    Depression    Diastolic dysfunction    a. 08/2015 Echo: EF 60-65%, no rwma, Gr2 DD; b. 03/2021 Echo: EF 60-65%, GrI DD; c. 01/2023 Echo: EF 60-65%, no rwma, GrI DD, nl RV fxn, no significant valvular disease.   DJD (degenerative joint disease)    Hashimoto's disease    Headache(784.0)    Loss of weight 08/30/2022   Nausea and vomiting 08/30/2022   Osteoarthritis 09/2022   bilateral hip   Palpitations    a. 02/2021 Zio: Predominantly sinus rhythm, avg 80 (50-141).  Rare PACs/PVCs.  4 beat atrial tachycardia; b. 12/2022 48hr Holter: Predominantly sinus rhythm, average 80 (55-119).  Rare PACs/PVCs.  2 SVT runs up to 5 beats at max of 152.   PONV (postoperative  nausea and vomiting)    Raynaud disease    Weakness of left arm 10/21/2015   Past Surgical History:  Procedure Laterality Date   BACK SURGERY     ESOPHAGOGASTRODUODENOSCOPY  09/28/2011   Procedure: ESOPHAGOGASTRODUODENOSCOPY (EGD);  Surgeon: Lynwood LITTIE Celestia Mickey., MD;  Location: [facility];  Service: Endoscopy;  Laterality: N/A;   HIP SURGERY     KNEE SURGERY     SPINE SURGERY     TONSILLECTOMY      Allergies  Allergies  Allergen Reactions   Gentamycin [Gentamicin] Anaphylaxis   Alendronate     Other Reaction(s): Pain all over body   Statins     Other Reaction(s): myalgias       History of Present Illness      68 y.o. y/o female with a history of chest pain, nonobstructive CAD, orthostatic hypotension with presyncope/syncope, diastolic dysfunction, palpitations, minimal bilateral carotid arterial disease, hypothyroidism, polycythemia, gastroparesis with chronic constipation and chronic narcotic use, lumbar postlaminectomy syndrome with chronic pain, degenerative disc disease, Raynaud's phenomena, osteoarthritis, anxiety, and depression.  She initially established care with Dr. Burnard in 2017 in the setting of dyspnea  in chest pain.  Stress testing was nonischemic.  Echo showed normal LV function with grade 2 diastolic dysfunction.  She reestablished care with Dr. Hanson in September 2022 in the setting of 6 syncopal spells with frequent presyncope.  Outpatient cardiac monitoring showed predominantly sinus rhythm with rare PACs/PVCs, and 1 4 beat run of SVT.  Echocardiogram in October 2022 showed an EF of  60 to 65% with grade 1 diastolic dysfunction and no significant valvular abnormalities.  Carotid ultrasound in October 2022 showed 1 to 39% bilateral internal carotid artery stenoses.  Coronary CT angiogram in November 2022 showed a calcium  score of 31.9 (69 percentile), with mild, nonobstructive proximal LAD disease (25-49%).  She underwent repeat evaluation in the summer 2024 in the  setting of intermittent hypotension and presyncope/orthostasis.  Echo showed an EF of 60 to 65% with grade 1 diastolic dysfunction and no significant valvular disease.  Carotid ultrasound with stable 1 to 39% bilateral internal carotid artery stenoses.  She wore a ZIO monitor for 2 days which showed predominantly sinus rhythm and 2 brief episodes (up to 5 beats) of SVT.   [facility] was seen in the emergency department in April 2025 with a 2-hour history of chest pain.  Troponins were normals.  And CTA was negative for PE.  She was discharged home and follow-up in clinic in April 2025 at which time she reported ongoing, intermittent chest pain.  She subsequently underwent coronary CT angiogram in May 2025 which showed a calcium  score of 113 (79th percentile), with mild proximal LAD disease (25-49%).  More recently, she was hospitalized for 2 days in the setting of worsening low back pain over a 3-week period.  MRI of the lumbar spine showed features concerning for lumbar radiculopathy with moderate spinal stenosis and neurosurgery recommended medical and physical therapy along with outpatient follow-up.  She is scheduled for cortisone injections tomorrow morning.  From a cardiac standpoint, she notes that she has had maybe 1 or 2 episodes of chest pain since her coronary CTA.  These episodes occur randomly and are often relieved within 5 minutes with nitrates.  It has been about 2 months since she has had to take a nitroglycerin  and does not think that symptoms are significant enough to take a daily medication such as Ranexa or long-acting nitrate (blood pressure is also chronically soft).  She noted that while in the emergency department, blood pressure was often elevated, sometimes into the lower 80s and she did not know if she should be concerned about that as pressure typically runs low, like today at 100/60.  She also has a history of orthostasis and presyncope/syncope in the setting of low blood  pressures.  We discussed that house her severe back pain was likely driving her blood pressure while in the emergency department.  She denies dyspnea, palpitations, PND, orthopnea, edema, or early satiety. Objective   Home Medications    Current Outpatient Medications  Medication Sig Dispense Refill   aspirin  EC 81 MG tablet Take 1 tablet (81 mg total) by mouth daily. Swallow whole. 90 tablet 3   Calcium  Carbonate (CALCIUM  500 PO) Take 1,200 mg by mouth 2 (two) times daily.     cyanocobalamin (VITAMIN B12) 1000 MCG/ML injection INJECT ONCE A WEEK FOR 30 DAYS     denosumab (PROLIA) 60 MG/ML SOSY injection 60mg  Subcutaneous every 6 months     diclofenac Sodium (VOLTAREN) 1 % GEL Apply topically.     estradiol (ESTRACE) 2 MG tablet Take 2 mg by mouth daily.     levothyroxine  (SYNTHROID ) 88 MCG tablet Take 88 mcg by mouth daily before breakfast.     LORazepam  (ATIVAN ) 1 MG tablet Take 1 tablet (1 mg total) by mouth every 8 (eight) hours. 90 tablet 3   naloxone  (NARCAN ) nasal spray 4 mg/0.1 mL      omeprazole -sodium bicarbonate  (ZEGERID ) 40-1100 MG capsule Take 1  capsule by mouth daily before breakfast.     ondansetron  (ZOFRAN -ODT) 8 MG disintegrating tablet Take 1 tablet by mouth 3 (three) times daily. Take 1 tab by mouth tid     Oxycodone  HCl 10 MG TABS Take 10 mg by mouth every 4 (four) hours as needed (pain).     predniSONE  (STERAPRED UNI-PAK 21 TAB) 10 MG (21) TBPK tablet Take by mouth daily. Take 6 tabs by mouth daily  for 2 days, then 5 tabs for 2 days, then 4 tabs for 2 days, then 3 tabs for 2 days, 2 tabs for 2 days, then 1 tab by mouth daily for 2 days 42 tablet 0   progesterone (PROMETRIUM) 200 MG capsule Take 200 mg by mouth daily.     REPATHA 140 MG/ML SOSY Inject 1 mL into the skin every 14 (fourteen) days.     traZODone  (DESYREL ) 50 MG tablet Take 50 mg by mouth at bedtime as needed for sleep.     Vitamin D, Ergocalciferol, (DRISDOL) 1.25 MG (50000 UNIT) CAPS capsule TAKE 1 CAPSULE  BY MOUTH ONE TIME PER WEEK FOR 30 DAYS     omeprazole  (PRILOSEC) 40 MG capsule Take 40 mg by mouth as needed (Acid reflux). (Patient not taking: Reported on 01/11/2024)     rosuvastatin  (CRESTOR ) 20 MG tablet Take 1 tablet (20 mg total) by mouth daily. (Patient not taking: Reported on 01/11/2024) 90 tablet 3   No current facility-administered medications for this visit.     Physical Exam    VS:  BP 100/60 (BP Location: Left Arm, Patient Position: Sitting, Cuff Size: Normal)   Pulse 79   Ht 5' (1.524 m)   Wt 118 lb (53.5 kg)   SpO2 100%   BMI 23.05 kg/m  , BMI Body mass index is 23.05 kg/m.          GEN: Thin, in no acute distress. HEENT: normal. Neck: Supple, no JVD, carotid bruits, or masses. Cardiac: RRR, no murmurs, rubs, or gallops. No clubbing, cyanosis, edema.  Radials 2+/PT 2+ and equal bilaterally.  Respiratory:  Respirations regular and unlabored, clear to auscultation bilaterally. GI: Soft, nontender, nondistended, BS + x 4. MS: no deformity or atrophy. Skin: warm and dry, no rash. Neuro:  Strength and sensation are intact. Psych: Normal affect.  Accessory Clinical Findings    ECG personally reviewed by me today - EKG Interpretation Date/Time:  Friday January 11 2024 14:59:41 EDT Ventricular Rate:  79 PR Interval:  156 QRS Duration:  64 QT Interval:  370 QTC Calculation: 424 R Axis:   71  Text Interpretation: Normal sinus rhythm with sinus arrhythmia Normal ECG Confirmed by Vivienne Bruckner [phone]) on 01/11/2024 3:05:36 PM  - no acute changes.  Lab Results  Component Value Date   WBC 9.8 01/09/2024   HGB 16.3 (H) 01/09/2024   HCT 49.5 (H) 01/09/2024   MCV 91.7 01/09/2024   PLT 167 01/09/2024   Lab Results  Component Value Date   CREATININE 0.91 01/09/2024   BUN 14 01/09/2024   NA 137 01/09/2024   K 4.3 01/09/2024   CL 102 01/09/2024   CO2 24 01/09/2024   Lab Results  Component Value Date   ALT 9 01/08/2024   AST 15 01/08/2024   ALKPHOS 51  01/08/2024   BILITOT 0.8 01/08/2024   Lab Results  Component Value Date   CHOL 228 (H) 04/12/2021   HDL 65 04/12/2021   LDLCALC 145 (H) 04/12/2021   TRIG 105 04/12/2021  CHOLHDL 3.5 04/12/2021    Lab Results  Component Value Date   TSH 4.01 03/23/2021       Assessment & Plan    1.  Chest pain/nonobstructive CAD: ED visit earlier this year for chest pain with normal troponins.  Coronary CTA showed stable, 25 to 49% LAD stenosis.  When she has chest pain, she will usually take nitroglycerin  and symptoms resolve within about 5 minutes.  We discussed the possibility of microvascular angina and potential utility of long-acting nitrates or Ranexa.  In the setting of soft blood pressures and orthostasis, she would likely not tolerate nitrates.  She is not interested in Ranexa at this time as she notes she has not had chest pain in about 2 months.  2.  Orthostatic hypotension/presyncope/syncope: History of orthostasis with presyncope and syncope.  Pressure soft today at 100/60, which she says is pretty typical for her.  Discussed the importance of adequate hydration and slow and deliberate position changes.  Suspect pain medications may be exacerbating.  Prior event monitoring did not show any significant arrhythmias.  3.  Palpitations: History of palpitations though currently quiescent.  Prior event monitoring, most recently in July 2024, did not show any significant arrhythmias or pauses.  4.  Degenerative disc disease/chronic back pain: In the emergency department earlier this week due to worsening back pain.  Seen by neurosurgery with plan for outpatient follow-up.  She is seeing Ortho tomorrow for injections.  5.  Carotid arterial disease: Ultrasound August 2024 with stable, 1 to 39% bilateral ICA stenoses.  She remains on aspirin  and Repatha therapy.  6.  Polycythemia: Followed by hematology.  7.  Disposition: Follow-up in clinic in 3 months or sooner if necessary.  Lonni Meager,  NP 01/11/2024, 4:14 PM

## 2024-01-21 ENCOUNTER — Other Ambulatory Visit: Payer: Self-pay | Admitting: Family Medicine

## 2024-01-28 ENCOUNTER — Encounter: Payer: Self-pay | Admitting: Family Medicine

## 2024-01-28 ENCOUNTER — Ambulatory Visit (INDEPENDENT_AMBULATORY_CARE_PROVIDER_SITE_OTHER): Admitting: Family Medicine

## 2024-01-28 VITALS — BP 136/82 | HR 89 | Temp 99.1°F | Ht 60.0 in | Wt 118.0 lb

## 2024-01-28 DIAGNOSIS — G8929 Other chronic pain: Secondary | ICD-10-CM | POA: Diagnosis not present

## 2024-01-28 DIAGNOSIS — R339 Retention of urine, unspecified: Secondary | ICD-10-CM

## 2024-01-28 DIAGNOSIS — M542 Cervicalgia: Secondary | ICD-10-CM

## 2024-01-28 DIAGNOSIS — M549 Dorsalgia, unspecified: Secondary | ICD-10-CM | POA: Diagnosis not present

## 2024-01-28 LAB — URINALYSIS, ROUTINE W REFLEX MICROSCOPIC
Ketones, ur: 40 — AB
Leukocytes,Ua: NEGATIVE
Nitrite: NEGATIVE
Specific Gravity, Urine: 1.025 (ref 1.000–1.030)
Urine Glucose: NEGATIVE
Urobilinogen, UA: 1 (ref 0.0–1.0)
pH: 6 (ref 5.0–8.0)

## 2024-01-28 MED ORDER — PREDNISONE 10 MG (21) PO TBPK: ORAL_TABLET | Freq: Every day | ORAL | 0 refills | Status: DC

## 2024-01-28 MED ORDER — CYCLOBENZAPRINE HCL 10 MG PO TABS
10.0000 mg | ORAL_TABLET | Freq: Three times a day (TID) | ORAL | 0 refills | Status: AC | PRN
Start: 2024-01-28 — End: ?

## 2024-01-28 NOTE — Assessment & Plan Note (Addendum)
 Patient with significant flareup of chronic lower back pain over the last few weeks.  She was in the hospital for pain control about 3 weeks ago.  Her recent MRI during her hospitalization showed bulging disc in her lumbar spine with resulting lumbar radiculopathy and moderate spinal stenosis.    She has been following with Dr. Gifford since her hospitalization.  She is wanting to try physical therapy before pursuing surgical options.  She did have injections performed since her hospitalization with some modest benefit.  She has been on Robaxin though would like to switch this to Flexeril  as it has been more effective for pain control.  Will refill this for her today.   She is currently on oxycodone  for pain control though she is interested in trying another round of prednisone .  Her last prednisone  dose was about 3 weeks ago.  We did discuss issues with long-term prednisone  use however given that her last round was several weeks ago and she is still having significant pain would be reasonable to start another taper today.  We will defer further management to her spinal specialist.  We discussed reasons to return to care and seek emergent care.

## 2024-01-28 NOTE — Patient Instructions (Signed)
 It was very nice to see you today!  VISIT SUMMARY: You had a follow-up visit to address your severe back pain and urinary issues after a recent hospital stay. Your back pain is due to lumbar radiculopathy and spinal stenosis, and you continue to experience urinary retention.  YOUR PLAN: LUMBAR RADICULOPATHY WITH LUMBAR SPINAL STENOSIS AND SEVERE LOW BACK PAIN: Your severe back pain is due to nerve issues and spinal narrowing, confirmed by MRI. -Continue management per Dr. Bonner -Start a prednisone  taper  -Take Flexeril  for muscle relaxation. -Continue with physical therapy.  URINARY RETENTION: You are experiencing difficulty urinating and frequent urges, which may be related to your back issues or a possible urinary tract infection (UTI). -We will check a urine sample to check for a UTI.  Return if symptoms worsen or fail to improve.   Take care, Dr Kennyth  PLEASE NOTE:  If you had any lab tests, please let us  know if you have not heard back within a few days. You may see your results on mychart before we have a chance to review them but we will give you a call once they are reviewed by us .   If we ordered any referrals today, please let us  know if you have not heard from their office within the next week.   If you had any urgent prescriptions sent in today, please check with the pharmacy within an hour of our visit to make sure the prescription was transmitted appropriately.   Please try these tips to maintain a healthy lifestyle:  Eat at least 3 REAL meals and 1-2 snacks per day.  Aim for no more than 5 hours between eating.  If you eat breakfast, please do so within one hour of getting up.   Each meal should contain half fruits/vegetables, one quarter protein, and one quarter carbs (no bigger than a computer mouse)  Cut down on sweet beverages. This includes juice, soda, and sweet tea.   Drink at least 1 glass of water with each meal and aim for at least 8 glasses per  day  Exercise at least 150 minutes every week.

## 2024-01-28 NOTE — Progress Notes (Signed)
 Chief Complaint:  Michaela Hall is a 68 y.o. female who presents today for a TCM visit.  Assessment/Plan:  New/Acute Problems: Urinary Retention Symptoms have been stable since being discharged from the hospital about 3 weeks ago.  She is able to urinate however only a small dribble will come out then does not feel like she is having a good stream.  This may be related to her spinal stenosis however we will check UA and urine culture today to further evaluate.  She will continue management per her neurosurgery team for her spinal stenosis and lumbar radiculopathy however if this continues to be an ongoing issue would consider referral to urology depending on results of today's urinalysis and urine culture.  Chronic Problems Addressed Today: Chronic neck and back pain secondary to degenerative disc disease Patient with significant flareup of chronic lower back pain over the last few weeks.  She was in the hospital for pain control about 3 weeks ago.  Her recent MRI during her hospitalization showed bulging disc in her lumbar spine with resulting lumbar radiculopathy and moderate spinal stenosis.    She has been following with Dr. Gifford since her hospitalization.  She is wanting to try physical therapy before pursuing surgical options.  She did have injections performed since her hospitalization with some modest benefit.  She has been on Robaxin though would like to switch this to Flexeril  as it has been more effective for pain control.  Will refill this for her today.   She is currently on oxycodone  for pain control though she is interested in trying another round of prednisone .  Her last prednisone  dose was about 3 weeks ago.  We did discuss issues with long-term prednisone  use however given that her last round was several weeks ago and she is still having significant pain would be reasonable to start another taper today.  We will defer further management to her spinal specialist.  We  discussed reasons to return to care and seek emergent care.    Subjective:  HPI:  Summary of Hospital admission: Reason for admission: Back pain Date of admission: 01/08/2024 Date of discharge: 01/09/2024 Date of Interactive contact: 01/11/2024 Summary of Hospital course: Patient presented to the hospital on 01/08/2024 with severe worsening back pain after being seen in her orthopedics office.  She was having some urinary retention as well.  In the ED had MRI of the lumbar spine which showed lumbar radiculopathy and moderate spinal stenosis.  Neurology was consulted in the ED who recommended admission for pain control.  She was discharged home the next day with recommendation for outpatient follow-up and also PT and pain control.  Interim history:   Discussed the use of AI scribe software for clinical note transcription with the patient, who gave verbal consent to proceed.  History of Present Illness Michaela Hall is a 68 year old female with lumbar radiculopathy and moderate spinal stenosis who presents for follow-up after a recent hospital admission for severe back pain and urinary retention.  She continues to experience severe back pain, describing it as a 'ten' on the pain scale, and states that it is 'totally out of control.' She reports ruptured discs from L1 to L5. She has been receiving pain management, including Dilaudid  injections during her hospital stay, and has been prescribed prednisone  and muscle relaxers such as Robaxin. She has previously undergone two back surgeries and is trying to avoid another surgery.  She recently had steroid injections performed a couple of  weeks ago with Dr. Bonner.  She is interested in trying another round of prednisone .  Her last dose was about 2 and half weeks ago.  Prior to this she was on a 1 week taper that was prescribed by her spinal specialist.  She also would like to have her Robaxin switched to Flexeril  as this was more effective for her  previously.  She continues to experience urinary issues, including difficulty initiating a stream and frequent urges to urinate. She describes the urination as 'dribbling out' and notes that the urge to urinate is 'crazy.' These symptoms have persisted since her hospital discharge and have not improved.   ROS: Per HPI, otherwise a complete review of systems was negative.   PMH:  The following were reviewed and entered/updated in epic: Past Medical History:  Diagnosis Date   Anxiety    CAD (non-obstructive)    a. 08/2015 MV: EF 64%, no isch/infarct; b. 04/2021 Cor CTA: Ca2+ = 31.9 (69th%'ile). LAD 25-49p ; c. 10/2023 Cor CTA: Ca2+ = 113 (79th%'ile). LAD 25-49p.   Carotid arterial disease (HCC)    a. 01/2023 Carotid U/S: 1-39% bilat ICA stenoses.   Cervical spondylosis without myelopathy 10/21/2015   Cervical spondylosis without myelopathy 10/21/2015   Chronic pain    back and right hip   Complication of anesthesia    Depression    Diastolic dysfunction    a. 08/2015 Echo: EF 60-65%, no rwma, Gr2 DD; b. 03/2021 Echo: EF 60-65%, GrI DD; c. 01/2023 Echo: EF 60-65%, no rwma, GrI DD, nl RV fxn, no significant valvular disease.   DJD (degenerative joint disease)    Hashimoto's disease    Headache(784.0)    Loss of weight 08/30/2022   Nausea and vomiting 08/30/2022   Osteoarthritis 09/2022   bilateral hip   Palpitations    a. 02/2021 Zio: Predominantly sinus rhythm, avg 80 (50-141).  Rare PACs/PVCs.  4 beat atrial tachycardia; b. 12/2022 48hr Holter: Predominantly sinus rhythm, average 80 (55-119).  Rare PACs/PVCs.  2 SVT runs up to 5 beats at max of 152.   PONV (postoperative nausea and vomiting)    Raynaud disease    Weakness of left arm 10/21/2015   Patient Active Problem List   Diagnosis Date Noted   Low back pain 01/08/2024   Hypokalemia 01/08/2024   Polycythemia, secondary 09/03/2023   Loss of weight 08/30/2022   Early satiety 08/30/2022   Nausea and vomiting 08/30/2022    Constipation 08/10/2022   Hypothyroidism 03/23/2021   Abnormal skin color 10/14/2018   Chronic neck and back pain secondary to degenerative disc disease 08/06/2018   Gastroparesis 08/06/2018   Right hip pain 08/06/2018   History of tobacco abuse 08/05/2015   Pulmonary nodules 07/23/2015   Anxiety state 08/12/2012   Insomnia 08/12/2012   Menopause 08/12/2012   Past Surgical History:  Procedure Laterality Date   BACK SURGERY     ESOPHAGOGASTRODUODENOSCOPY  09/28/2011   Procedure: ESOPHAGOGASTRODUODENOSCOPY (EGD);  Surgeon: Lynwood LITTIE Celestia Mickey., MD;  Location: [facility];  Service: Endoscopy;  Laterality: N/A;   HIP SURGERY     KNEE SURGERY     SPINE SURGERY     TONSILLECTOMY      Family History  Problem Relation Age of Onset   Anesthesia problems Mother    Cancer Mother        ovary/uterus & breast   Hyperlipidemia Mother    Hypertension Mother    Subarachnoid hemorrhage Mother    Hyperlipidemia Father  Hypertension Father    Brain cancer Father    Breast cancer Maternal Aunt    Ovarian cancer Maternal Grandmother     Medications- Reconciled discharge and current medications in Epic.  Current Outpatient Medications  Medication Sig Dispense Refill   aspirin  EC 81 MG tablet Take 1 tablet (81 mg total) by mouth daily. Swallow whole. 90 tablet 3   Calcium  Carbonate (CALCIUM  500 PO) Take 1,200 mg by mouth 2 (two) times daily.     cyanocobalamin (VITAMIN B12) 1000 MCG/ML injection INJECT ONCE A WEEK FOR 30 DAYS     cyclobenzaprine  (FLEXERIL ) 10 MG tablet Take 1 tablet (10 mg total) by mouth 3 (three) times daily as needed for muscle spasms. 30 tablet 0   denosumab (PROLIA) 60 MG/ML SOSY injection 60mg  Subcutaneous every 6 months     diclofenac Sodium (VOLTAREN) 1 % GEL Apply topically.     estradiol (ESTRACE) 2 MG tablet Take 2 mg by mouth daily.     levothyroxine  (SYNTHROID ) 88 MCG tablet Take 88 mcg by mouth daily before breakfast.     LORazepam  (ATIVAN ) 1 MG tablet TAKE  1 TABLET (1 MG TOTAL) BY MOUTH EVERY 8 (EIGHT) HOURS. 90 tablet 3   naloxone  (NARCAN ) nasal spray 4 mg/0.1 mL      omeprazole -sodium bicarbonate  (ZEGERID ) 40-1100 MG capsule Take 1 capsule by mouth daily before breakfast.     ondansetron  (ZOFRAN -ODT) 8 MG disintegrating tablet Take 1 tablet by mouth 3 (three) times daily. Take 1 tab by mouth tid     Oxycodone  HCl 10 MG TABS Take 10 mg by mouth every 4 (four) hours as needed (pain).     progesterone (PROMETRIUM) 200 MG capsule Take 200 mg by mouth daily.     REPATHA 140 MG/ML SOSY Inject 1 mL into the skin every 14 (fourteen) days.     Vitamin D, Ergocalciferol, (DRISDOL) 1.25 MG (50000 UNIT) CAPS capsule TAKE 1 CAPSULE BY MOUTH ONE TIME PER WEEK FOR 30 DAYS     omeprazole  (PRILOSEC) 40 MG capsule Take 40 mg by mouth as needed (Acid reflux). (Patient not taking: Reported on 01/11/2024)     predniSONE  (STERAPRED UNI-PAK 21 TAB) 10 MG (21) TBPK tablet Take by mouth daily. Take 6 tabs by mouth daily  for 2 days, then 5 tabs for 2 days, then 4 tabs for 2 days, then 3 tabs for 2 days, 2 tabs for 2 days, then 1 tab by mouth daily for 2 days 42 tablet 0   rosuvastatin  (CRESTOR ) 20 MG tablet Take 1 tablet (20 mg total) by mouth daily. (Patient not taking: Reported on 01/11/2024) 90 tablet 3   traZODone  (DESYREL ) 50 MG tablet Take 50 mg by mouth at bedtime as needed for sleep.     No current facility-administered medications for this visit.    Allergies-reviewed and updated Allergies  Allergen Reactions   Gentamycin [Gentamicin] Anaphylaxis   Alendronate     Other Reaction(s): Pain all over body   Alendronate Sodium Other (See Comments)    alendronate sodium   Codeine Nausea Only and Other (See Comments)    codeine   Statins     Other Reaction(s): myalgias   Vancomycin Other (See Comments)    Social History   Socioeconomic History   Marital status: Married    Spouse name: Not on file   Number of children: 3   Years of education: Not on file    Highest education level: Not on file  Occupational History  Occupation: Medicare ins. Investment banker, corporate: Advertising copywriter  Tobacco Use   Smoking status: Former    Current packs/day: 0.00    Average packs/day: 0.5 packs/day for 20.0 years (10.0 ttl pk-yrs)    Types: Cigarettes    Start date: 06/12/1994    Quit date: 06/12/2014    Years since quitting: 9.6   Smokeless tobacco: Never  Vaping Use   Vaping status: Some Days   Devices: does not include nicotine  Substance and Sexual Activity   Alcohol use: No    Alcohol/week: 0.0 standard drinks of alcohol   Drug use: No   Sexual activity: Not on file  Other Topics Concern   Not on file  Social History Narrative   Lives at home w/ her husband   Right-handed   Very little caffeine use, drinks 7up   Last colon screening: 09/2011   Social Drivers of Health   Financial Resource Strain: Low Risk  (06/20/2023)   Overall Financial Resource Strain (CARDIA)    Difficulty of Paying Living Expenses: Not hard at all  Food Insecurity: No Food Insecurity (01/09/2024)   Hunger Vital Sign    Worried About Running Out of Food in the Last Year: Never true    Ran Out of Food in the Last Year: Never true  Transportation Needs: No Transportation Needs (01/09/2024)   PRAPARE - Administrator, Civil Service (Medical): No    Lack of Transportation (Non-Medical): No  Physical Activity: Inactive (06/20/2023)   Exercise Vital Sign    Days of Exercise per Week: 0 days    Minutes of Exercise per Session: 0 min  Stress: Stress Concern Present (06/20/2023)   Michaela Hall of Occupational Health - Occupational Stress Questionnaire    Feeling of Stress : Very much  Social Connections: Moderately Integrated (01/09/2024)   Social Connection and Isolation Panel    Frequency of Communication with Friends and Family: Three times a week    Frequency of Social Gatherings with Friends and Family: Twice a week    Attends Religious Services: More than 4 times  per year    Active Member of [employer] or Organizations: No    Attends Engineer, structural: Never    Marital Status: Married        Objective:  Physical Exam: BP 136/82   Pulse 89   Temp 99.1 F (37.3 C) (Temporal)   Ht 5' (1.524 m)   Wt 118 lb (53.5 kg)   SpO2 98%   BMI 23.05 kg/m   Gen: NAD, resting comfortably CV: RRR with no murmurs appreciated Pulm: NWOB, CTAB with no crackles, wheezes, or rhonchi MSK: No edema, cyanosis, or clubbing noted Skin: Warm, dry Neuro: Grossly normal, moves all extremities Psych: Normal affect and thought content  Time Spent: 40 minutes of total time was spent on the date of the encounter performing the following actions: chart review prior to seeing the patient including recent hospitalization, obtaining history, performing a medically necessary exam, counseling on the treatment plan, placing orders, and documenting in our EHR.      Worth HERO. Kennyth, MD 01/28/2024 1:47 PM

## 2024-01-29 ENCOUNTER — Ambulatory Visit: Payer: Self-pay

## 2024-01-29 LAB — URINE CULTURE
MICRO NUMBER:: 16845559
SPECIMEN QUALITY:: ADEQUATE

## 2024-01-29 NOTE — Telephone Encounter (Signed)
 FYI Only or Action Required?: Action required by provider: Referral for possible hip surgery and xray.  Patient was last seen in primary care on 01/28/2024 by Kennyth Worth HERO, MD.  Called Nurse Triage reporting Hip Pain.  Symptoms began today.  Interventions attempted: Prescription medications: Percocet, Flexeril .  Symptoms are: gradually worsening.  Triage Disposition: See PCP When Office is Open (Within 3 Days)  Patient/caregiver understands and will follow disposition?: Yes           Copied from CRM #[id number]. Topic: Clinical - Red Word Triage >> Jan 29, 2024  4:04 PM Lauren C wrote: Red Word that prompted transfer to Nurse Triage: Was walking around bed and heard a pop in her hip followed by debilitating hip pain. She wants to know if she can come into the office to get an x-ray tomorrow or if she needs to go to the hospital. She was in the hospital 2 weeks ago already for spinal stenosis, so she does not want to go back. Reason for Disposition  [1] MODERATE pain (e.g., interferes with normal activities, limping) AND [2] present > 3 days    Patient states she has a dx of spinal stenosis and states she was seen in hospital for pain in her back. Pt unsure if symptoms are related. Hip pain is not chronic but states she has chronic pain. Due to symptoms, this RN's nursing judgement determined the patient can be seen in office for symptoms. Next open appointment on Monday in office.  Answer Assessment - Initial Assessment Questions Patient states she had a recent back injury and was seen in the hospital for symptoms and is unsure if symptoms are related. Has Percocet and flexeril  currently not helping with symptoms. Patient states she was walking around her bed when she heard a pop in her R hip. Patient states she is unable to bear weight but will use a walker for symptoms. Patient states she has her husband to help her get around and that she has some limping. Patient requesting xray  of hip area states she knows she has to have surgery on the area. Patient also requesting referral for hip surgery.     1. LOCATION and RADIATION: Where is the pain located? Does the pain spread (shoot) anywhere else?     Down to thigh/ groin  2. QUALITY: What does the pain feel like?  (e.g., sharp, dull, aching, burning)     Tingling/ numbness sensation at first for 10 minutes when she felt pop but since this has resolved. Currently a burning/ sharp pain. Feels like a hot poker in hip.  3. SEVERITY: How bad is the pain? What does it keep you from doing?   (Scale 1-10; or mild, moderate, severe)     States pain is a 12  4. ONSET: When did the pain start? Does it come and go, or is it there all the time?     Constant  5. CAUSE: What do you think is causing the hip pain?      Unsure if related to her back pain or need for hip surgery  6. AGGRAVATING FACTORS: What makes the hip pain worse? (e.g., walking, climbing stairs, running)     Walking  7. OTHER SYMPTOMS: Do you have any other symptoms? (e.g., back pain, pain shooting down leg,  fever, rash)     No  Protocols used: Hip Pain-A-AH

## 2024-01-30 ENCOUNTER — Ambulatory Visit: Payer: Self-pay | Admitting: Family Medicine

## 2024-01-30 ENCOUNTER — Other Ambulatory Visit (HOSPITAL_COMMUNITY): Payer: Self-pay

## 2024-01-30 ENCOUNTER — Other Ambulatory Visit: Payer: Self-pay | Admitting: *Deleted

## 2024-01-30 ENCOUNTER — Encounter: Payer: Self-pay | Admitting: Hematology and Oncology

## 2024-01-30 DIAGNOSIS — N029 Recurrent and persistent hematuria with unspecified morphologic changes: Secondary | ICD-10-CM

## 2024-01-30 DIAGNOSIS — R300 Dysuria: Secondary | ICD-10-CM

## 2024-01-30 MED ORDER — HYDROMORPHONE HCL 2 MG PO TABS
2.0000 mg | ORAL_TABLET | Freq: Four times a day (QID) | ORAL | 0 refills | Status: DC | PRN
Start: 2024-01-30 — End: 2024-02-21
  Filled 2024-01-30: qty 28, 7d supply, fill #0

## 2024-01-30 NOTE — Telephone Encounter (Signed)
 Spoke with patient, has an orthopedic appt today

## 2024-01-30 NOTE — Telephone Encounter (Signed)
 Patient has an office visit on 02/04/2024

## 2024-01-30 NOTE — Telephone Encounter (Signed)
 She can come in for x-ray however if she is having severe debilitating pain it would probably be better for her to go to the emergency room for evaluation.

## 2024-01-30 NOTE — Progress Notes (Signed)
 Urine culture was negative for infection however she does have a small amount of blood in the urine.  Recommend she come back in 1 to 2 weeks to recheck UA and urine culture though she should let us  know if she has any change in her symptoms.

## 2024-02-04 ENCOUNTER — Ambulatory Visit: Admitting: Family Medicine

## 2024-02-13 ENCOUNTER — Other Ambulatory Visit

## 2024-02-13 ENCOUNTER — Ambulatory Visit: Admitting: Internal Medicine

## 2024-02-21 ENCOUNTER — Other Ambulatory Visit (HOSPITAL_COMMUNITY): Payer: Self-pay

## 2024-02-21 MED ORDER — HYDROMORPHONE HCL 4 MG PO TABS
4.0000 mg | ORAL_TABLET | Freq: Four times a day (QID) | ORAL | 0 refills | Status: DC
  Filled 2024-02-21: qty 28, 7d supply, fill #0

## 2024-03-11 ENCOUNTER — Other Ambulatory Visit: Payer: Self-pay | Admitting: Neurological Surgery

## 2024-03-14 NOTE — Progress Notes (Signed)
 Left phone message with the following instructions.  SDW INSTRUCTIONS:  Your procedure is scheduled on Monday 01/16/2024. Please report to [facility] Main Entrance A at 1230 P.M., and check in at the Admitting office. Call this number if you have problems the morning of surgery: [phone]   Remember: Do not eat  after midnight the night before your surgery  You may drink clear liquids until 1200 pm the morning of your surgery.   Clear liquids allowed are: Water, Non-Citrus Juices (without pulp), Carbonated Beverages, Clear Tea, Black Coffee Only, and Gatorade   Medications to take morning of surgery with a sip of water include: Synthroid   If NEEDED Lorazepam , Narcan , Zofran , Percocet  As of today, STOP taking any Aspirin  (unless otherwise instructed by your surgeon), Aleve, Naproxen, Ibuprofen, Motrin, Advil, Goody's, BC's, all herbal medications, fish oil, and all vitamins.    The Morning of Surgery Do not wear jewelry, make-up or nail polish. Do not wear lotions, powders, or perfumes/colognes, or deodorant Do not bring valuables to the hospital. [facility] is not responsible for any belongings or valuables.  If you are a smoker, DO NOT Smoke 24 hours prior to surgery  If you wear a CPAP at night please bring your mask the morning of surgery   Remember that you must have someone to transport you home after your surgery, and remain with you for 24 hours if you are discharged the same day.  Please bring cases for contacts, glasses, hearing aids, dentures or bridgework because it cannot be worn into surgery.   Patients discharged the day of surgery will not be allowed to drive home.   Please shower the NIGHT BEFORE/MORNING OF SURGERY (use antibacterial soap like DIAL soap if possible). Wear comfortable clothes the morning of surgery. Oral Hygiene is also important to reduce your risk of infection.  Remember - BRUSH YOUR TEETH THE MORNING OF SURGERY WITH YOUR REGULAR  TOOTHPASTE  Patient denies shortness of breath, fever, cough and chest pain.

## 2024-03-14 NOTE — Progress Notes (Signed)
 Anesthesia Chart Review:  Case: [id number] Date/Time: 03/17/24 1451   Procedure: LUMBAR LAMINECTOMY/DECOMPRESSION MICRODISCECTOMY 1 LEVEL (Right: Back) - Microdiscectomy - right - L1-L2   Anesthesia type: General   Diagnosis: Lumbar radiculopathy [M54.16]   Pre-op diagnosis: Lumbar radiculopathy   Location: [facility] ROOM 18 / [facility]   Surgeons: Joshua Alm Hamilton, MD       DISCUSSION: Patient is a 68 year old female scheduled for the above procedure.   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  She had cardiology follow-up with Vivienne Bruckner, MD on 01/11/2024 for chest pain follow-up. ED visit 09/2023. Troponins negative. CTA negative for PE. Continued intermittent chest pain, so she had a coronary CTA in May 2025 that showed a calcium  score of 113 (79th percentile), with mild proximal LAD disease (25-49%). According to his notes, she will still have random episodes of chest pain for which she will take nitroglycerin . Consider microvascular angina. She had not had any any about two months, so decision make not to start Ranexa or long-acting nitrates (particularly with hypotension history). No significant palpitations. On ASA and Repatha for HLD and mild carotid artery disease. 3 month follow-up planned.   She is a same day work-up. Anesthesia team to evaluate on the day of surgery.    VS:  Wt Readings from Last 3 Encounters:  01/28/24 53.5 kg  01/11/24 53.5 kg  01/08/24 53.5 kg   BP Readings from Last 3 Encounters:  01/28/24 136/82  01/11/24 100/60  01/09/24 133/79   Pulse Readings from Last 3 Encounters:  01/28/24 89   01/11/24 79  01/09/24 69    PROVIDERS: Kennyth Worth HERO, MD is PCP  End, Bruckner, MD is cardiologist Lonn Hicks, MD is CHERIE Gregg Lek, MD is neurologist Leigh Standing, MD is GI  LABS: For day of surgery as indicated. Most recent lab results in [facility] include: Lab Results  Component Value Date   WBC 9.8 01/09/2024   HGB 16.3 (H) 01/09/2024   HCT 49.5 (H) 01/09/2024   PLT 167 01/09/2024   GLUCOSE 105 (H) 01/09/2024   ALT 9 01/08/2024   AST 15 01/08/2024   NA 137 01/09/2024   K 4.3 01/09/2024   CL 102 01/09/2024   CREATININE 0.91 01/09/2024   BUN 14 01/09/2024   CO2 24 01/09/2024    IMAGES: MRI L-spine 01/08/2024: IMPRESSION: 1. Broad-based disc bulge with right paracentral caudal disc extrusion at L1-2, no resulting in severe central spinal canal stenosis and impingement of the L2 nerves in the lateral recesses bilaterally. 2. Broad-based disc bulge at L2-3, with moderate central spinal canal stenosis and right lateral recess stenosis, with impingement of the right L3 nerve. 3. Broad-based disc bulge at L4-5 with left posterolateral protrusion, impinging upon the left L4 nerve in the lateral recess. 4. Broad-based disc bulging at L3-4, with questionable impingement of the L4 nerves in the lateral recesses bilaterally.  CT Chest (over read CCTA) 10/11/2023: IMPRESSION: 1. Mild distal esophageal wall thickening, can be seen with reflux or esophagitis. 2. Aortic Atherosclerosis (ICD10-I70.0).   CTA Chest 09/26/2023: IMPRESSION: 1. No evidence of pulmonary embolism. 2. No acute intrathoracic pathology identified. 3. Mild coronary artery calcification  PET Scan 08/24/2023: IMPRESSION: 1. No tracer avid primary malignancy or metastatic disease. 2.  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 3. Incidental findings, including: Coronary artery atherosclerosis. Aortic Atherosclerosis (ICD10-I70.0).    EKG: 01/11/2024: Normal sinus rhythm with sinus  arrhythmia Normal ECG Confirmed by Vivienne Bruckner [phone]) on 01/11/2024 3:05:36 PM   CV: CTA Coronary 10/11/2023: IMPRESSION: 1. Coronary calcium  score of 113. This was 79th percentile for age and sex matched control. 2. Normal coronary origin with right dominance. 3. Mild proximal LAD stenosis (25-49%). 4. CAD-RADS 2. Mild non-obstructive CAD (25-49%). Consider preventive therapy and risk factor modification.    Echo 02/09/2023: IMPRESSIONS   1. Left ventricular ejection fraction, by estimation, is 60 to 65%. Left  ventricular ejection fraction by PLAX is 56 %. The left ventricle has  normal function. The left ventricle has no regional wall motion  abnormalities. Left ventricular diastolic  parameters are consistent with Grade I diastolic dysfunction (impaired  relaxation).   2. Right ventricular systolic function is normal. The right ventricular  size is normal.   3. The mitral valve is normal in structure. No evidence of mitral valve  regurgitation. No evidence of mitral stenosis.   4. The aortic valve has an indeterminant number of cusps. Aortic valve  regurgitation is not visualized. No aortic stenosis is present.   5. The inferior vena cava is normal in size with greater than 50%  respiratory variability, suggesting right atrial pressure of 3 mmHg.    US  Carotid 02/09/2023: Summary:  - Right Carotid: Velocities in the right ICA are consistent with a 1-39%  stenosis.  - Left Carotid: Velocities in the left ICA are consistent with a 1-39%  stenosis.  - Vertebrals: Bilateral vertebral arteries demonstrate antegrade flow.  - Subclavians: Normal flow hemodynamics were seen in bilateral subclavian               arteries.    Long Term Monitor 01/11/2023 - 8/3/202024:   The patient was monitored for 2 days.   The predominant rhythm was sinus with an average rate of 80 bpm (range 55-119 bpm in sinus).   There were rare PAC's and PVC's.   Two supraventricular runs were  observed, lasting up to 5 beats with a maximum rate of 152 bpm.   No sustained arrhythmia or prolonged pause was seen.   There were no patient triggered events.   Predominantly sinus rhythm with rare PAC's and PVC's as well as two brief supraventricular runs.  Please note, monitoring period was short (2 days).   Past Medical History:  Diagnosis Date   Anxiety    CAD (non-obstructive)    a. 08/2015 MV: EF 64%, no isch/infarct; b. 04/2021 Cor CTA: Ca2+ = 31.9 (69th%'ile). LAD 25-49p ; c. 10/2023 Cor CTA: Ca2+ = 113 (79th%'ile). LAD 25-49p.   Carotid arterial disease    a. 01/2023 Carotid U/S: 1-39% bilat ICA stenoses.   Cervical spondylosis without myelopathy 10/21/2015   Cervical spondylosis without myelopathy 10/21/2015   Chronic pain    back and right hip   Complication of anesthesia    Depression    Diastolic dysfunction    a. 08/2015 Echo: EF 60-65%, no rwma, Gr2 DD; b. 03/2021 Echo: EF 60-65%, GrI DD; c. 01/2023 Echo: EF 60-65%, no rwma, GrI DD, nl RV fxn, no significant valvular disease.   DJD (degenerative joint disease)    Hashimoto's disease    Headache(784.0)    Loss of weight 08/30/2022   Nausea and vomiting 08/30/2022   Osteoarthritis 09/2022   bilateral hip   Palpitations    a. 02/2021 Zio:  Predominantly sinus rhythm, avg 80 (50-141).  Rare PACs/PVCs.  4 beat atrial tachycardia; b. 12/2022 48hr Holter: Predominantly sinus rhythm, average 80 (55-119).  Rare PACs/PVCs.  2 SVT runs up to 5 beats at max of 152.   PONV (postoperative nausea and vomiting)    Raynaud disease    Weakness of left arm 10/21/2015    Past Surgical History:  Procedure Laterality Date   BACK SURGERY     ESOPHAGOGASTRODUODENOSCOPY  09/28/2011   Procedure: ESOPHAGOGASTRODUODENOSCOPY (EGD);  Surgeon: Lynwood LITTIE Celestia Mickey., MD;  Location: [facility];  Service: Endoscopy;  Laterality: N/A;   HIP SURGERY     KNEE SURGERY     SPINE SURGERY     TONSILLECTOMY      MEDICATIONS: No current  facility-administered medications for this encounter.    aspirin  EC 81 MG tablet   buprenorphine (BUTRANS) 5 MCG/HR PTWK   cyanocobalamin (VITAMIN B12) 1000 MCG/ML injection   denosumab (PROLIA) 60 MG/ML SOSY injection   diclofenac Sodium (VOLTAREN) 1 % GEL   estradiol (VIVELLE-DOT) 0.1 MG/24HR patch   levothyroxine  (SYNTHROID ) 88 MCG tablet   LORazepam  (ATIVAN ) 1 MG tablet   naloxone  (NARCAN ) nasal spray 4 mg/0.1 mL   ondansetron  (ZOFRAN -ODT) 8 MG disintegrating tablet   oxyCODONE -acetaminophen  (PERCOCET) 10-325 MG tablet   progesterone (PROMETRIUM) 200 MG capsule   REPATHA 140 MG/ML SOSY   Vitamin D, Ergocalciferol, (DRISDOL) 1.25 MG (50000 UNIT) CAPS capsule   cyclobenzaprine  (FLEXERIL ) 10 MG tablet   rosuvastatin  (CRESTOR ) 20 MG tablet    Isaiah Ruder, PA-C Surgical Short Stay/Anesthesiology [facility] Phone [phone] [facility] Phone [phone] 03/14/2024 5:15 PM

## 2024-03-14 NOTE — Anesthesia Preprocedure Evaluation (Addendum)
 Anesthesia Evaluation  Patient identified by MRN, date of birth, ID band Patient awake    Reviewed: Allergy & Precautions, NPO status , Patient's Chart, lab work & pertinent test results, reviewed documented beta blocker date and time   History of Anesthesia Complications (+) PONV and history of anesthetic complications  Airway Mallampati: II  TM Distance: >3 FB     Dental no notable dental hx.    Pulmonary neg COPD, former smoker   breath sounds clear to auscultation       Cardiovascular (-) hypertension+ CAD   Rhythm:Regular Rate:Normal  IMPRESSIONS     1. Left ventricular ejection fraction, by estimation, is 60 to 65%. Left  ventricular ejection fraction by PLAX is 56 %. The left ventricle has  normal function. The left ventricle has no regional wall motion  abnormalities. Left ventricular diastolic  parameters are consistent with Grade I diastolic dysfunction (impaired  relaxation).   2. Right ventricular systolic function is normal. The right ventricular  size is normal.   3. The mitral valve is normal in structure. No evidence of mitral valve  regurgitation. No evidence of mitral stenosis.   4. The aortic valve has an indeterminant number of cusps. Aortic valve  regurgitation is not visualized. No aortic stenosis is present.   5. The inferior vena cava is normal in size with greater than 50%  respiratory variability, suggesting right atrial pressure of 3 mmHg.     Neuro/Psych  Headaches PSYCHIATRIC DISORDERS Anxiety Depression       GI/Hepatic ,neg GERD  ,,(+) neg Cirrhosis    substance abuse    Endo/Other  Hypothyroidism    Renal/GU Renal disease     Musculoskeletal  (+) Arthritis , Osteoarthritis,  narcotic dependent  Abdominal   Peds  Hematology   Anesthesia Other Findings   Reproductive/Obstetrics                              Anesthesia Physical Anesthesia  Plan  ASA: 3  Anesthesia Plan: General   Post-op Pain Management:    Induction: Intravenous  PONV Risk Score and Plan: 3 and Ondansetron , Dexamethasone and TIVA  Airway Management Planned: Oral ETT  Additional Equipment:   Intra-op Plan:   Post-operative Plan:   Informed Consent: I have reviewed the patients History and Physical, chart, labs and discussed the procedure including the risks, benefits and alternatives for the proposed anesthesia with the patient or authorized representative who has indicated his/her understanding and acceptance.     Dental advisory given  Plan Discussed with: CRNA  Anesthesia Plan Comments: (PAT note written 03/14/2024 by Allison Zelenak, PA-C.  )         Anesthesia Quick Evaluation

## 2024-03-17 ENCOUNTER — Ambulatory Visit (HOSPITAL_BASED_OUTPATIENT_CLINIC_OR_DEPARTMENT_OTHER): Payer: Self-pay | Admitting: Vascular Surgery

## 2024-03-17 ENCOUNTER — Encounter (HOSPITAL_COMMUNITY): Admission: RE | Disposition: A | Payer: Self-pay | Source: Home / Self Care | Attending: Neurological Surgery

## 2024-03-17 ENCOUNTER — Observation Stay (HOSPITAL_COMMUNITY)
Admission: RE | Admit: 2024-03-17 | Discharge: 2024-03-18 | Disposition: A | Discharge status: Home / Self Care | Attending: Neurological Surgery | Admitting: Neurological Surgery

## 2024-03-17 ENCOUNTER — Ambulatory Visit (HOSPITAL_COMMUNITY)

## 2024-03-17 ENCOUNTER — Other Ambulatory Visit: Payer: Self-pay

## 2024-03-17 ENCOUNTER — Ambulatory Visit (HOSPITAL_COMMUNITY): Payer: Self-pay | Admitting: Vascular Surgery

## 2024-03-17 ENCOUNTER — Encounter (HOSPITAL_COMMUNITY): Payer: Self-pay | Admitting: Neurological Surgery

## 2024-03-17 DIAGNOSIS — Z7982 Long term (current) use of aspirin: Secondary | ICD-10-CM | POA: Insufficient documentation

## 2024-03-17 DIAGNOSIS — F418 Other specified anxiety disorders: Secondary | ICD-10-CM

## 2024-03-17 DIAGNOSIS — I2510 Atherosclerotic heart disease of native coronary artery without angina pectoris: Secondary | ICD-10-CM

## 2024-03-17 DIAGNOSIS — E039 Hypothyroidism, unspecified: Secondary | ICD-10-CM | POA: Diagnosis not present

## 2024-03-17 DIAGNOSIS — M48061 Spinal stenosis, lumbar region without neurogenic claudication: Principal | ICD-10-CM | POA: Insufficient documentation

## 2024-03-17 DIAGNOSIS — Z9889 Other specified postprocedural states: Principal | ICD-10-CM

## 2024-03-17 DIAGNOSIS — Z87891 Personal history of nicotine dependence: Secondary | ICD-10-CM | POA: Diagnosis not present

## 2024-03-17 DIAGNOSIS — M5416 Radiculopathy, lumbar region: Secondary | ICD-10-CM | POA: Diagnosis not present

## 2024-03-17 DIAGNOSIS — M5116 Intervertebral disc disorders with radiculopathy, lumbar region: Secondary | ICD-10-CM | POA: Diagnosis not present

## 2024-03-17 DIAGNOSIS — M79604 Pain in right leg: Secondary | ICD-10-CM | POA: Diagnosis present

## 2024-03-17 HISTORY — PX: LUMBAR LAMINECTOMY/DECOMPRESSION MICRODISCECTOMY: SHX5026

## 2024-03-17 LAB — BASIC METABOLIC PANEL WITH GFR
Anion gap: 9 (ref 5–15)
BUN: 12 mg/dL (ref 8–23)
CO2: 24 mmol/L (ref 22–32)
Calcium: 9.3 mg/dL (ref 8.9–10.3)
Chloride: 105 mmol/L (ref 98–111)
Creatinine, Ser: 0.8 mg/dL (ref 0.44–1.00)
GFR, Estimated: >60 mL/min (ref >60)
Glucose, Bld: 106 mg/dL — ABNORMAL HIGH (ref 70–99)
Potassium: 3.6 mmol/L (ref 3.5–5.1)
Sodium: 138 mmol/L (ref 135–145)

## 2024-03-17 LAB — CBC
HCT: 48.1 % — ABNORMAL HIGH (ref 36.0–46.0)
Hemoglobin: 15.7 g/dL — ABNORMAL HIGH (ref 12.0–15.0)
MCH: 30.8 pg (ref 26.0–34.0)
MCHC: 32.6 g/dL (ref 30.0–36.0)
MCV: 94.5 fL (ref 80.0–100.0)
Platelets: 268 K/uL (ref 150–400)
RBC: 5.09 MIL/uL (ref 3.87–5.11)
RDW: 14.5 % (ref 11.5–15.5)
WBC: 8.4 K/uL (ref 4.0–10.5)
nRBC: 0 % (ref 0.0–0.2)

## 2024-03-17 LAB — SURGICAL PCR SCREEN
MRSA, PCR: NEGATIVE
Staphylococcus aureus: NEGATIVE

## 2024-03-17 SURGERY — LUMBAR LAMINECTOMY/DECOMPRESSION MICRODISCECTOMY 1 LEVEL
Anesthesia: General | Site: Back | Laterality: Right

## 2024-03-17 MED ORDER — DEXAMETHASONE SODIUM PHOSPHATE 10 MG/ML IJ SOLN
INTRAMUSCULAR | Status: AC
  Filled 2024-03-17: qty 1

## 2024-03-17 MED ORDER — SODIUM CHLORIDE 0.9 % IV SOLN
250.0000 mL | INTRAVENOUS | Status: DC
  Administered 2024-03-17: 250 mL via INTRAVENOUS

## 2024-03-17 MED ORDER — OXYCODONE HCL 5 MG PO TABS
ORAL_TABLET | ORAL | Status: AC
  Filled 2024-03-17: qty 1

## 2024-03-17 MED ORDER — MENTHOL 3 MG MT LOZG: 1.0000 | LOZENGE | OROMUCOSAL | Status: DC | PRN

## 2024-03-17 MED ORDER — THROMBIN 5000 UNITS EX KIT
PACK | CUTANEOUS | Status: AC
  Filled 2024-03-17: qty 1

## 2024-03-17 MED ORDER — METHOCARBAMOL 500 MG PO TABS
ORAL_TABLET | ORAL | Status: AC
  Filled 2024-03-17: qty 1

## 2024-03-17 MED ORDER — HYDROMORPHONE HCL 1 MG/ML IJ SOLN
0.2500 mg | INTRAMUSCULAR | Status: DC | PRN
  Administered 2024-03-17 (×4): 0.5 mg via INTRAVENOUS

## 2024-03-17 MED ORDER — MIDAZOLAM HCL 2 MG/2ML IJ SOLN
INTRAMUSCULAR | Status: AC
  Filled 2024-03-17: qty 2

## 2024-03-17 MED ORDER — OXYCODONE-ACETAMINOPHEN 10-325 MG PO TABS: 1.0000 | ORAL_TABLET | ORAL | Status: DC | PRN

## 2024-03-17 MED ORDER — OXYCODONE HCL 5 MG PO TABS
10.0000 mg | ORAL_TABLET | Freq: Once | ORAL | Status: AC
  Administered 2024-03-17: 10 mg via ORAL

## 2024-03-17 MED ORDER — KETAMINE HCL 50 MG/5ML IJ SOSY
PREFILLED_SYRINGE | INTRAMUSCULAR | Status: DC | PRN
Start: 2024-03-17 — End: 2024-03-17
  Administered 2024-03-17: 30 mg via INTRAVENOUS

## 2024-03-17 MED ORDER — FENTANYL CITRATE (PF) 100 MCG/2ML IJ SOLN
INTRAMUSCULAR | Status: AC
  Filled 2024-03-17: qty 2

## 2024-03-17 MED ORDER — METHOCARBAMOL 500 MG PO TABS
500.0000 mg | ORAL_TABLET | Freq: Four times a day (QID) | ORAL | Status: DC | PRN
  Administered 2024-03-17 – 2024-03-18 (×2): 500 mg via ORAL
  Filled 2024-03-17: qty 1

## 2024-03-17 MED ORDER — PROGESTERONE 200 MG PO CAPS
200.0000 mg | ORAL_CAPSULE | Freq: Every day | ORAL | Status: DC
  Filled 2024-03-17: qty 1

## 2024-03-17 MED ORDER — ACETAMINOPHEN 325 MG PO TABS: 650.0000 mg | ORAL_TABLET | ORAL | Status: DC | PRN

## 2024-03-17 MED ORDER — ONDANSETRON HCL 4 MG/2ML IJ SOLN
INTRAMUSCULAR | Status: AC
  Filled 2024-03-17: qty 2

## 2024-03-17 MED ORDER — SENNA 8.6 MG PO TABS
1.0000 | ORAL_TABLET | Freq: Two times a day (BID) | ORAL | Status: DC
  Administered 2024-03-17 – 2024-03-18 (×2): 8.6 mg via ORAL
  Filled 2024-03-17 (×2): qty 1

## 2024-03-17 MED ORDER — 0.9 % SODIUM CHLORIDE (POUR BTL) OPTIME
TOPICAL | Status: DC | PRN
  Administered 2024-03-17: 1000 mL

## 2024-03-17 MED ORDER — ACETAMINOPHEN 10 MG/ML IV SOLN: 1000.0000 mg | Freq: Once | INTRAVENOUS | Status: DC | PRN

## 2024-03-17 MED ORDER — GABAPENTIN 300 MG PO CAPS
300.0000 mg | ORAL_CAPSULE | ORAL | Status: AC
Start: 2024-03-17 — End: 2024-03-17
  Administered 2024-03-17: 300 mg via ORAL
  Filled 2024-03-17: qty 1

## 2024-03-17 MED ORDER — MIDAZOLAM HCL 2 MG/2ML IJ SOLN
INTRAMUSCULAR | Status: DC | PRN
  Administered 2024-03-17: 2 mg via INTRAVENOUS

## 2024-03-17 MED ORDER — THROMBIN 5000 UNITS EX SOLR
OROMUCOSAL | Status: DC | PRN
  Administered 2024-03-17: 5 mL via TOPICAL

## 2024-03-17 MED ORDER — CELECOXIB 200 MG PO CAPS
200.0000 mg | ORAL_CAPSULE | Freq: Two times a day (BID) | ORAL | Status: DC
  Administered 2024-03-17 – 2024-03-18 (×2): 200 mg via ORAL
  Filled 2024-03-17 (×2): qty 1

## 2024-03-17 MED ORDER — ONDANSETRON HCL 4 MG PO TABS: 4.0000 mg | ORAL_TABLET | Freq: Four times a day (QID) | ORAL | Status: DC | PRN

## 2024-03-17 MED ORDER — KETAMINE HCL 50 MG/5ML IJ SOSY
PREFILLED_SYRINGE | INTRAMUSCULAR | Status: AC
  Filled 2024-03-17: qty 5

## 2024-03-17 MED ORDER — ONDANSETRON HCL 4 MG/2ML IJ SOLN: 4.0000 mg | Freq: Once | INTRAMUSCULAR | Status: DC | PRN

## 2024-03-17 MED ORDER — CHLORHEXIDINE GLUCONATE CLOTH 2 % EX PADS: 6.0000 | MEDICATED_PAD | Freq: Once | CUTANEOUS | Status: DC

## 2024-03-17 MED ORDER — PHENYLEPHRINE HCL-NACL 20-0.9 MG/250ML-% IV SOLN
INTRAVENOUS | Status: DC | PRN
  Administered 2024-03-17: 20 ug/min via INTRAVENOUS

## 2024-03-17 MED ORDER — BUPIVACAINE HCL (PF) 0.25 % IJ SOLN
INTRAMUSCULAR | Status: AC
  Filled 2024-03-17: qty 30

## 2024-03-17 MED ORDER — HYDROMORPHONE HCL 1 MG/ML IJ SOLN
INTRAMUSCULAR | Status: AC
  Filled 2024-03-17: qty 1

## 2024-03-17 MED ORDER — CEFAZOLIN SODIUM-DEXTROSE 2-4 GM/100ML-% IV SOLN
2.0000 g | INTRAVENOUS | Status: AC
Start: 2024-03-17 — End: 2024-03-17
  Administered 2024-03-17: 2 g via INTRAVENOUS
  Filled 2024-03-17: qty 100

## 2024-03-17 MED ORDER — SODIUM CHLORIDE 0.9% FLUSH: 3.0000 mL | INTRAVENOUS | Status: DC | PRN

## 2024-03-17 MED ORDER — METHOCARBAMOL 1000 MG/10ML IJ SOLN: 500.0000 mg | Freq: Four times a day (QID) | INTRAMUSCULAR | Status: DC | PRN

## 2024-03-17 MED ORDER — DEXAMETHASONE SODIUM PHOSPHATE 10 MG/ML IJ SOLN
INTRAMUSCULAR | Status: DC | PRN
Start: 2024-03-17 — End: 2024-03-17
  Administered 2024-03-17: 5 mg via INTRAVENOUS

## 2024-03-17 MED ORDER — PHENYLEPHRINE 80 MCG/ML (10ML) SYRINGE FOR IV PUSH (FOR BLOOD PRESSURE SUPPORT)
PREFILLED_SYRINGE | INTRAVENOUS | Status: AC
  Filled 2024-03-17: qty 10

## 2024-03-17 MED ORDER — PHENOL 1.4 % MT LIQD
1.0000 | OROMUCOSAL | Status: DC | PRN
Start: 2024-03-17 — End: 2024-03-18

## 2024-03-17 MED ORDER — OXYCODONE HCL 5 MG PO TABS
5.0000 mg | ORAL_TABLET | ORAL | Status: DC | PRN
Start: 2024-03-17 — End: 2024-03-18
  Administered 2024-03-17 – 2024-03-18 (×4): 5 mg via ORAL
  Filled 2024-03-17 (×4): qty 1

## 2024-03-17 MED ORDER — ONDANSETRON HCL 4 MG/2ML IJ SOLN: 4.0000 mg | Freq: Four times a day (QID) | INTRAMUSCULAR | Status: DC | PRN

## 2024-03-17 MED ORDER — OXYCODONE HCL 5 MG PO TABS
ORAL_TABLET | ORAL | Status: AC
  Filled 2024-03-17: qty 2

## 2024-03-17 MED ORDER — SUGAMMADEX SODIUM 200 MG/2ML IV SOLN
INTRAVENOUS | Status: DC | PRN
Start: 2024-03-17 — End: 2024-03-17
  Administered 2024-03-17: 200 mg via INTRAVENOUS

## 2024-03-17 MED ORDER — SODIUM CHLORIDE 0.9% FLUSH: 3.0000 mL | Freq: Two times a day (BID) | INTRAVENOUS | Status: DC

## 2024-03-17 MED ORDER — LIDOCAINE 2% (20 MG/ML) 5 ML SYRINGE
INTRAMUSCULAR | Status: AC
  Filled 2024-03-17: qty 5

## 2024-03-17 MED ORDER — PROPOFOL 500 MG/50ML IV EMUL
INTRAVENOUS | Status: DC | PRN
  Administered 2024-03-17: 50 ug/kg/min via INTRAVENOUS

## 2024-03-17 MED ORDER — BUPIVACAINE HCL (PF) 0.25 % IJ SOLN
INTRAMUSCULAR | Status: DC | PRN
  Administered 2024-03-17: 10 mL

## 2024-03-17 MED ORDER — PROPOFOL 10 MG/ML IV BOLUS
INTRAVENOUS | Status: AC
  Filled 2024-03-17: qty 20

## 2024-03-17 MED ORDER — CHLORHEXIDINE GLUCONATE 0.12 % MT SOLN
15.0000 mL | Freq: Once | OROMUCOSAL | Status: AC
  Administered 2024-03-17: 15 mL via OROMUCOSAL
  Filled 2024-03-17: qty 15

## 2024-03-17 MED ORDER — ACETAMINOPHEN 650 MG RE SUPP: 650.0000 mg | RECTAL | Status: DC | PRN

## 2024-03-17 MED ORDER — FENTANYL CITRATE (PF) 250 MCG/5ML IJ SOLN
INTRAMUSCULAR | Status: AC
  Filled 2024-03-17: qty 5

## 2024-03-17 MED ORDER — FENTANYL CITRATE (PF) 100 MCG/2ML IJ SOLN
25.0000 ug | INTRAMUSCULAR | Status: DC | PRN
  Administered 2024-03-17 (×3): 50 ug via INTRAVENOUS

## 2024-03-17 MED ORDER — OXYCODONE HCL 5 MG/5ML PO SOLN: 5.0000 mg | Freq: Once | ORAL | Status: AC | PRN

## 2024-03-17 MED ORDER — PROPOFOL 10 MG/ML IV BOLUS
INTRAVENOUS | Status: DC | PRN
  Administered 2024-03-17: 70 mg via INTRAVENOUS

## 2024-03-17 MED ORDER — LEVOTHYROXINE SODIUM 88 MCG PO TABS
88.0000 ug | ORAL_TABLET | Freq: Every day | ORAL | Status: DC
  Administered 2024-03-18: 88 ug via ORAL
  Filled 2024-03-17: qty 1

## 2024-03-17 MED ORDER — POTASSIUM CHLORIDE IN NACL 20-0.9 MEQ/L-% IV SOLN
INTRAVENOUS | Status: DC
  Filled 2024-03-17: qty 1000

## 2024-03-17 MED ORDER — ROCURONIUM BROMIDE 10 MG/ML (PF) SYRINGE
PREFILLED_SYRINGE | INTRAVENOUS | Status: DC | PRN
  Administered 2024-03-17: 60 mg via INTRAVENOUS
  Administered 2024-03-17: 10 mg via INTRAVENOUS

## 2024-03-17 MED ORDER — CEFAZOLIN SODIUM-DEXTROSE 2-4 GM/100ML-% IV SOLN
2.0000 g | Freq: Three times a day (TID) | INTRAVENOUS | Status: AC
  Administered 2024-03-17 – 2024-03-18 (×2): 2 g via INTRAVENOUS
  Filled 2024-03-17 (×2): qty 100

## 2024-03-17 MED ORDER — FENTANYL CITRATE (PF) 250 MCG/5ML IJ SOLN
INTRAMUSCULAR | Status: DC | PRN
  Administered 2024-03-17 (×2): 100 ug via INTRAVENOUS
  Administered 2024-03-17: 50 ug via INTRAVENOUS

## 2024-03-17 MED ORDER — PHENYLEPHRINE 80 MCG/ML (10ML) SYRINGE FOR IV PUSH (FOR BLOOD PRESSURE SUPPORT)
PREFILLED_SYRINGE | INTRAVENOUS | Status: DC | PRN
  Administered 2024-03-17: 80 ug via INTRAVENOUS

## 2024-03-17 MED ORDER — ROCURONIUM BROMIDE 10 MG/ML (PF) SYRINGE
PREFILLED_SYRINGE | INTRAVENOUS | Status: AC
  Filled 2024-03-17: qty 10

## 2024-03-17 MED ORDER — LIDOCAINE HCL (CARDIAC) PF 100 MG/5ML IV SOSY
PREFILLED_SYRINGE | INTRAVENOUS | Status: DC | PRN
  Administered 2024-03-17: 100 mg via INTRAVENOUS

## 2024-03-17 MED ORDER — MORPHINE SULFATE (PF) 2 MG/ML IV SOLN
2.0000 mg | INTRAVENOUS | Status: DC | PRN
  Administered 2024-03-17 – 2024-03-18 (×2): 2 mg via INTRAVENOUS
  Filled 2024-03-17 (×2): qty 1

## 2024-03-17 MED ORDER — ONDANSETRON HCL 4 MG/2ML IJ SOLN
INTRAMUSCULAR | Status: DC | PRN
  Administered 2024-03-17: 4 mg via INTRAVENOUS

## 2024-03-17 MED ORDER — ORAL CARE MOUTH RINSE: 15.0000 mL | Freq: Once | OROMUCOSAL | Status: AC

## 2024-03-17 MED ORDER — ACETAMINOPHEN 500 MG PO TABS
1000.0000 mg | ORAL_TABLET | ORAL | Status: AC
Start: 2024-03-17 — End: 2024-03-17
  Administered 2024-03-17: 1000 mg via ORAL
  Filled 2024-03-17: qty 2

## 2024-03-17 MED ORDER — OXYCODONE HCL 5 MG PO TABS
5.0000 mg | ORAL_TABLET | Freq: Once | ORAL | Status: AC | PRN
  Administered 2024-03-17: 5 mg via ORAL

## 2024-03-17 MED ORDER — LACTATED RINGERS IV SOLN: INTRAVENOUS | Status: DC

## 2024-03-17 MED ORDER — OXYCODONE-ACETAMINOPHEN 5-325 MG PO TABS
1.0000 | ORAL_TABLET | ORAL | Status: DC | PRN
  Administered 2024-03-17 – 2024-03-18 (×4): 1 via ORAL
  Filled 2024-03-17 (×4): qty 1

## 2024-03-17 SURGICAL SUPPLY — 38 items
BAG COUNTER SPONGE SURGICOUNT (BAG) ×2 IMPLANT
BAND RUBBER #18 3X1/16 STRL (MISCELLANEOUS) ×4 IMPLANT
BENZOIN TINCTURE PRP APPL 2/3 (GAUZE/BANDAGES/DRESSINGS) ×2 IMPLANT
BUR CARBIDE MATCH 3.0 (BURR) ×2 IMPLANT
CANISTER SUCTION 3000ML PPV (SUCTIONS) ×2 IMPLANT
DERMABOND ADVANCED .7 DNX12 (GAUZE/BANDAGES/DRESSINGS) IMPLANT
DRAPE LAPAROTOMY 100X72X124 (DRAPES) ×2 IMPLANT
DRAPE MICROSCOPE SLANT 54X150 (MISCELLANEOUS) ×2 IMPLANT
DRAPE SURG 17X23 STRL (DRAPES) ×2 IMPLANT
DRSG OPSITE POSTOP 4X6 (GAUZE/BANDAGES/DRESSINGS) IMPLANT
DURAPREP 26ML APPLICATOR (WOUND CARE) ×2 IMPLANT
ELECTRODE REM PT RTRN 9FT ADLT (ELECTROSURGICAL) ×2 IMPLANT
GAUZE 4X4 16PLY ~~LOC~~+RFID DBL (SPONGE) IMPLANT
GLOVE BIO SURGEON STRL SZ7 (GLOVE) IMPLANT
GLOVE BIO SURGEON STRL SZ8 (GLOVE) ×2 IMPLANT
GLOVE BIOGEL PI IND STRL 7.0 (GLOVE) IMPLANT
GOWN STRL REUS W/ TWL LRG LVL3 (GOWN DISPOSABLE) IMPLANT
GOWN STRL REUS W/ TWL XL LVL3 (GOWN DISPOSABLE) ×2 IMPLANT
GOWN STRL REUS W/TWL 2XL LVL3 (GOWN DISPOSABLE) IMPLANT
HEMOSTAT POWDER KIT SURGIFOAM (HEMOSTASIS) ×2 IMPLANT
KIT BASIN OR (CUSTOM PROCEDURE TRAY) ×2 IMPLANT
KIT TURNOVER KIT B (KITS) ×2 IMPLANT
NDL HYPO 25X1 1.5 SAFETY (NEEDLE) ×2 IMPLANT
NDL SPNL 20GX3.5 QUINCKE YW (NEEDLE) IMPLANT
NEEDLE HYPO 25X1 1.5 SAFETY (NEEDLE) ×1 IMPLANT
NEEDLE SPNL 20GX3.5 QUINCKE YW (NEEDLE) IMPLANT
PACK LAMINECTOMY NEURO (CUSTOM PROCEDURE TRAY) ×2 IMPLANT
PAD ARMBOARD POSITIONER FOAM (MISCELLANEOUS) ×6 IMPLANT
SOLN 0.9% NACL 1000 ML (IV SOLUTION) ×1 IMPLANT
SOLN 0.9% NACL POUR BTL 1000ML (IV SOLUTION) ×2 IMPLANT
SOLN STERILE WATER 1000 ML (IV SOLUTION) ×1 IMPLANT
SOLN STERILE WATER BTL 1000 ML (IV SOLUTION) ×2 IMPLANT
STRIP CLOSURE SKIN 1/2X4 (GAUZE/BANDAGES/DRESSINGS) ×2 IMPLANT
SUT VIC AB 0 CT1 18XCR BRD8 (SUTURE) ×2 IMPLANT
SUT VIC AB 2-0 CP2 18 (SUTURE) ×2 IMPLANT
SUT VIC AB 3-0 SH 8-18 (SUTURE) ×2 IMPLANT
TOWEL GREEN STERILE (TOWEL DISPOSABLE) ×2 IMPLANT
TOWEL GREEN STERILE FF (TOWEL DISPOSABLE) ×2 IMPLANT

## 2024-03-17 NOTE — Plan of Care (Signed)

## 2024-03-17 NOTE — Op Note (Signed)
 03/17/2024  5:11 PM  PATIENT:  Michaela Hall  68 y.o. female  PRE-OPERATIVE DIAGNOSIS: Lumbar spinal stenosis L1-2 with large right sided disc herniation with inferior free fragment with back and right leg pain  POST-OPERATIVE DIAGNOSIS:  same  PROCEDURE: Right L1-2 hemilaminectomy medial facetectomy and foraminotomies followed by microdiscectomy utilizing microscopic dissection  SURGEON:  Alm Molt, MD  ASSISTANTS: Suzen Pean, FNP  ANESTHESIA:   General  EBL: 20 ml  Total I/O In: 1000 [I.V.:1000] Out: 20 [Blood:20]  BLOOD ADMINISTERED: none  DRAINS: None  SPECIMEN:  none  INDICATION FOR PROCEDURE: This patient presented with right leg pain, some bladder difficulty, and numbness and weakness in the right leg. Imaging showed large disc herniation L1-2 on the right with severe spinal stenosis. The patient tried conservative measures without relief. Pain was debilitating. Recommended decompressive laminectomy followed by microdiscectomy. Patient understood the risks, benefits, and alternatives and potential outcomes and wished to proceed.  PROCEDURE DETAILS: The patient was taken to the operating room and after induction of adequate generalized endotracheal anesthesia, the patient was rolled into the prone position on the Wilson frame and all pressure points were padded. The lumbar region was cleaned and then prepped with DuraPrep and draped in the usual sterile fashion. 5 cc of local anesthesia was injected and then a dorsal midline incision was made and carried down to the lumbo sacral fascia. The fascia was opened and the paraspinous musculature was taken down in a subperiosteal fashion to expose L1 -2 on the right. Intraoperative x-ray confirmed my level, and then I used a combination of the high-speed drill and the Kerrison punches to perform a hemilaminectomy, medial facetectomy, and foraminotomy at L1-2 on the right. The underlying yellow ligament was opened and removed  in a piecemeal fashion to expose the underlying dura and exiting nerve root. I undercut the lateral recess and dissected down until I was medial to and distal to the pedicle. The nerve root was well decompressed. We then gently retracted the nerve root medially with a retractor, coagulated the epidural venous vasculature, found a large inferior free fragment.  This was removed with a nerve hook.  Was a large inferior annular rent and we performed a thorough intradiscal discectomy with pituitary rongeurs and curettes, until I had a nice decompression of the nerve root and the midline. I then palpated with a coronary dilator along the nerve root and into the foramen to assure adequate decompression. I felt no more compression of the nerve root. I irrigated with saline solution containing bacitracin. Achieved hemostasis with bipolar cautery, lined the dura with Gelfoam, and then closed the fascia with 0 Vicryl. I closed the subcutaneous tissues with 2-0 Vicryl and the subcuticular tissues with 3-0 Vicryl. The skin was then closed with benzoin and Steri-Strips. The drapes were removed, a sterile dressing was applied.  My nurse practitioner was involved in the exposure, safe retraction of the neural elements, the disc work and the closure. the patient was awakened from general anesthesia and transferred to the recovery room in stable condition. At the end of the procedure all sponge, needle and instrument counts were correct.    PLAN OF CARE: Admit for overnight observation  PATIENT DISPOSITION:  PACU - hemodynamically stable.   Delay start of Pharmacological VTE agent (>24hrs) due to surgical blood loss or risk of bleeding:  yes

## 2024-03-17 NOTE — Anesthesia Procedure Notes (Signed)
 Procedure Name: Intubation Date/Time: 03/17/2024 3:19 PM  Performed by: Cindie Donald CROME, CRNAPre-anesthesia Checklist: Patient identified, Emergency Drugs available, Suction available and Patient being monitored Patient Re-evaluated:Patient Re-evaluated prior to induction Oxygen Delivery Method: Circle System Utilized Preoxygenation: Pre-oxygenation with 100% oxygen Induction Type: IV induction Ventilation: Mask ventilation without difficulty Laryngoscope Size: Mac and 3 Grade View: Grade I Tube type: Oral Tube size: 7.0 mm Number of attempts: 1 Airway Equipment and Method: Stylet Placement Confirmation: ETT inserted through vocal cords under direct vision, positive ETCO2 and breath sounds checked- equal and bilateral Secured at: 21 cm Tube secured with: Tape Dental Injury: Teeth and Oropharynx as per pre-operative assessment

## 2024-03-17 NOTE — Transfer of Care (Signed)
 Immediate Anesthesia Transfer of Care Note  Patient: Michaela Hall  Procedure(s) Performed: LUMBAR LAMINECTOMY/DECOMPRESSION MICRODISCECTOMY RIGHT LUMBAR ONE-TWO (Right: Back)  Patient Location: PACU  Anesthesia Type:General  Level of Consciousness: awake, alert , and oriented  Airway & Oxygen Therapy: Patient Spontanous Breathing and Patient connected to nasal cannula oxygen  Post-op Assessment: Report given to RN and Post -op Vital signs reviewed and stable  Post vital signs: Reviewed and stable  Last Vitals:  Vitals Value Taken Time  BP 146/89 03/17/24 16:35  Temp    Pulse 100 03/17/24 16:40  Resp 15 03/17/24 16:40  SpO2 100 % 03/17/24 16:40  Vitals shown include unfiled device data.  Last Pain:  Vitals:   03/17/24 1336  TempSrc:   PainSc: 9          Complications: No notable events documented.

## 2024-03-17 NOTE — H&P (Signed)
 Subjective: Patient is a 68 y.o. female admitted for R leg pain with weakness. Onset of symptoms was several weeks ago, gradually worsening since that time.  The pain is rated severe, and is located at the across the lower back and radiates to R ant thigh and hip. The pain is described as aching and occurs all day. The symptoms have been progressive. Symptoms are exacerbated by exercise and standing. MRI or CT showed large HNP with stenosis L1-2 right   Past Medical History:  Diagnosis Date   Anxiety    CAD (non-obstructive)    a. 08/2015 MV: EF 64%, no isch/infarct; b. 04/2021 Cor CTA: Ca2+ = 31.9 (69th%'ile). LAD 25-49p ; c. 10/2023 Cor CTA: Ca2+ = 113 (79th%'ile). LAD 25-49p.   Carotid arterial disease    a. 01/2023 Carotid U/S: 1-39% bilat ICA stenoses.   Cervical spondylosis without myelopathy 10/21/2015   Cervical spondylosis without myelopathy 10/21/2015   Chronic pain    back and right hip   Complication of anesthesia    Depression    Diastolic dysfunction    a. 08/2015 Echo: EF 60-65%, no rwma, Gr2 DD; b. 03/2021 Echo: EF 60-65%, GrI DD; c. 01/2023 Echo: EF 60-65%, no rwma, GrI DD, nl RV fxn, no significant valvular disease.   DJD (degenerative joint disease)    Hashimoto's disease    Headache(784.0)    Loss of weight 08/30/2022   Nausea and vomiting 08/30/2022   Osteoarthritis 09/2022   bilateral hip   Palpitations    a. 02/2021 Zio: Predominantly sinus rhythm, avg 80 (50-141).  Rare PACs/PVCs.  4 beat atrial tachycardia; b. 12/2022 48hr Holter: Predominantly sinus rhythm, average 80 (55-119).  Rare PACs/PVCs.  2 SVT runs up to 5 beats at max of 152.   PONV (postoperative nausea and vomiting)    Raynaud disease    Weakness of left arm 10/21/2015    Past Surgical History:  Procedure Laterality Date   BACK SURGERY     ESOPHAGOGASTRODUODENOSCOPY  09/28/2011   Procedure: ESOPHAGOGASTRODUODENOSCOPY (EGD);  Surgeon: Lynwood LITTIE Celestia Mickey., MD;  Location: [facility];  Service:  Endoscopy;  Laterality: N/A;   HIP SURGERY     KNEE SURGERY     SPINE SURGERY     TONSILLECTOMY      Prior to Admission medications   Medication Sig Start Date End Date Taking? Authorizing Provider  aspirin  EC 81 MG tablet Take 1 tablet (81 mg total) by mouth daily. Swallow whole. 05/03/21  Yes Dunn, Bernardino HERO, PA-C  buprenorphine (BUTRANS) 5 MCG/HR PTWK Place 1 patch onto the skin once a week. 03/03/24  Yes [provider]  cyanocobalamin (VITAMIN B12) 1000 MCG/ML injection Inject 1,000 mcg into the muscle every 30 (thirty) days.   Yes [provider]  estradiol (VIVELLE-DOT) 0.1 MG/24HR patch Place 1 patch onto the skin 2 (two) times a week. 02/13/24  Yes [provider]  levothyroxine  (SYNTHROID ) 88 MCG tablet Take 88 mcg by mouth daily before breakfast.   Yes [provider]  LORazepam  (ATIVAN ) 1 MG tablet TAKE 1 TABLET (1 MG TOTAL) BY MOUTH EVERY 8 (EIGHT) HOURS. Patient taking differently: Take 1 mg by mouth every 8 (eight) hours as needed for anxiety. 01/21/24  Yes Kennyth Worth HERO, MD  naloxone  (NARCAN ) nasal spray 4 mg/0.1 mL Place 0.4 mg into the nose once as needed (Pain).   Yes [provider]  ondansetron  (ZOFRAN -ODT) 8 MG disintegrating tablet Take 1 tablet by mouth 2 (two) times daily. 08/03/15  Yes [provider]  oxyCODONE -acetaminophen  (PERCOCET) 10-325 MG tablet Take 1 tablet by mouth 5 (five) times daily. 03/01/24  Yes [provider]  progesterone (PROMETRIUM) 200 MG capsule Take 200 mg by mouth daily.   Yes [provider]  Vitamin D, Ergocalciferol, (DRISDOL) 1.25 MG (50000 UNIT) CAPS capsule Take 50,000 Units by mouth every 7 (seven) days.   Yes [provider]  cyclobenzaprine  (FLEXERIL ) 10 MG tablet Take 1 tablet (10 mg total) by mouth 3 (three) times daily as needed for muscle spasms. Patient not taking: Reported on 03/12/2024 01/28/24   Parker, Caleb M, MD  denosumab (PROLIA) 60 MG/ML SOSY  injection 60mg  Subcutaneous every 6 months 06/16/21   [provider]  diclofenac Sodium (VOLTAREN) 1 % GEL Apply 2 g topically as needed (Pain). 01/08/23   [provider]  REPATHA 140 MG/ML SOSY Inject 1 mL into the skin every 14 (fourteen) days. Patient not taking: Reported on 03/17/2024 12/24/23   [provider]  rosuvastatin  (CRESTOR ) 20 MG tablet Take 1 tablet (20 mg total) by mouth daily. Patient not taking: Reported on 01/11/2024 11/13/23 02/11/24  Abigail Bernardino HERO, PA-C   Allergies  Allergen Reactions   Gentamycin [Gentamicin] Anaphylaxis   Alendronate     Other Reaction(s): Pain all over body   Alendronate Sodium Other (See Comments)    alendronate sodium   Codeine Nausea Only and Other (See Comments)    codeine   Statins     Other Reaction(s): myalgias   Vancomycin Other (See Comments)    Kidney injury in 1980     Social History   Tobacco Use   Smoking status: Former    Current packs/day: 0.00    Average packs/day: 0.5 packs/day for 20.0 years (10.0 ttl pk-yrs)    Types: Cigarettes    Start date: 06/12/1994    Quit date: 06/12/2014    Years since quitting: 9.7   Smokeless tobacco: Never  Substance Use Topics   Alcohol use: No    Alcohol/week: 0.0 standard drinks of alcohol    Family History  Problem Relation Age of Onset   Anesthesia problems Mother    Cancer Mother        ovary/uterus & breast   Hyperlipidemia Mother    Hypertension Mother    Subarachnoid hemorrhage Mother    Hyperlipidemia Father    Hypertension Father    Brain cancer Father    Breast cancer Maternal Aunt    Ovarian cancer Maternal Grandmother      Review of Systems  Positive ROS: neg  All other systems have been reviewed and were otherwise negative with the exception of those mentioned in the HPI and as above.  Objective: Vital signs in last 24 hours: Temp:  [98 F (36.7 C)] 98 F (36.7 C) (10/06 1236) Pulse Rate:  [98] 98 (10/06 1236) Resp:  [20] 20 (10/06  1236) BP: (138)/(95) 138/95 (10/06 1236) SpO2:  [100 %] 100 % (10/06 1236) Weight:  [49.9 kg] 49.9 kg (10/06 1236)  General Appearance: Alert, cooperative, no distress, appears stated age Head: Normocephalic, without obvious abnormality, atraumatic Eyes: PERRL, conjunctiva/corneas clear, EOM's intact    Neck: Supple, symmetrical, trachea midline Back: Symmetric, no curvature, ROM normal, no CVA tenderness Lungs:  respirations unlabored Heart: Regular rate and rhythm Abdomen: Soft, non-tender Extremities: Extremities normal, atraumatic, no cyanosis or edema Pulses: 2+ and symmetric all extremities Skin: Skin color, texture, turgor normal, no rashes or lesions  NEUROLOGIC:   Mental status: Alert  and oriented x4,  no aphasia, good attention span, fund of knowledge, and memory Motor Exam - grossly normal except R proximal leg weakness Sensory Exam - grossly normal Reflexes: 1+ Coordination - grossly normal Gait - grossly normal Balance - grossly normal Cranial Nerves: I: smell Not tested  II: visual acuity  OS: nl    OD: nl  II: visual fields Full to confrontation  II: pupils Equal, round, reactive to light  III,VII: ptosis None  III,IV,VI: extraocular muscles  Full ROM  V: mastication Normal  V: facial light touch sensation  Normal  V,VII: corneal reflex  Present  VII: facial muscle function - upper  Normal  VII: facial muscle function - lower Normal  VIII: hearing Not tested  IX: soft palate elevation  Normal  IX,X: gag reflex Present  XI: trapezius strength  5/5  XI: sternocleidomastoid strength 5/5  XI: neck flexion strength  5/5  XII: tongue strength  Normal    Data Review Lab Results  Component Value Date   WBC 9.8 01/09/2024   HGB 16.3 (H) 01/09/2024   HCT 49.5 (H) 01/09/2024   MCV 91.7 01/09/2024   PLT 167 01/09/2024   Lab Results  Component Value Date   NA 137 01/09/2024   K 4.3 01/09/2024   CL 102 01/09/2024   CO2 24 01/09/2024   BUN 14 01/09/2024    CREATININE 0.91 01/09/2024   GLUCOSE 105 (H) 01/09/2024   Lab Results  Component Value Date   INR 0.97 08/08/2010    Assessment/Plan:  Estimated body mass index is 21.48 kg/m as calculated from the following:   Height as of this encounter: 5' (1.524 m).   Weight as of this encounter: 49.9 kg. Patient admitted for R L1-2 DLL with microdiskectomy. Patient has failed a reasonable attempt at conservative therapy.  I explained the condition and procedure to the patient and answered any questions.  Patient wishes to proceed with procedure as planned. Understands risks/ benefits and typical outcomes of procedure.   Michaela Hall 03/17/2024 1:22 PM

## 2024-03-18 ENCOUNTER — Encounter (HOSPITAL_COMMUNITY): Payer: Self-pay | Admitting: Neurological Surgery

## 2024-03-18 DIAGNOSIS — M48061 Spinal stenosis, lumbar region without neurogenic claudication: Secondary | ICD-10-CM | POA: Diagnosis not present

## 2024-03-18 NOTE — Anesthesia Postprocedure Evaluation (Signed)
 Anesthesia Post Note  Patient: Silvia J Diemer  Procedure(s) Performed: LUMBAR LAMINECTOMY/DECOMPRESSION MICRODISCECTOMY RIGHT LUMBAR ONE-TWO (Right: Back)     Patient location during evaluation: PACU Anesthesia Type: General Level of consciousness: awake and alert Pain management: pain level controlled Vital Signs Assessment: post-procedure vital signs reviewed and stable Respiratory status: spontaneous breathing, nonlabored ventilation, respiratory function stable and patient connected to nasal cannula oxygen Cardiovascular status: blood pressure returned to baseline and stable Postop Assessment: no apparent nausea or vomiting Anesthetic complications: no   No notable events documented.  Last Vitals:  Vitals:   03/17/24 2259 03/18/24 0551  BP: (!) 156/79 135/81  Pulse:  82  Resp: 18 18  Temp: 36.8 C 36.8 C  SpO2: 98% 100%    Last Pain:  Vitals:   03/18/24 0642  TempSrc:   PainSc: 5                  Lynwood MARLA Cornea

## 2024-03-18 NOTE — Discharge Summary (Signed)
 Physician Discharge Summary  Patient ID: Michaela Hall MRN: [id number] DOB/AGE: 1955/08/17 68 y.o.  Admit date: 03/17/2024 Discharge date: 03/18/2024  Admission Diagnoses: lumbar stenosis with HNP, radiculopathy    Discharge Diagnoses: same   Discharged Condition: good  Hospital Course: The patient was admitted on 03/17/2024 and taken to the operating room where the patient underwent DLL with diskectomy L1-2. The patient tolerated the procedure well and was taken to the recovery room and then to the floor in stable condition. The hospital course was routine. There were no complications. The wound remained clean dry and intact. Pt had appropriate back soreness. No complaints of leg pain or new N/T/W. The patient remained afebrile with stable vital signs, and tolerated a regular diet. The patient continued to increase activities, and pain was well controlled with oral pain medications.   Consults: None  Significant Diagnostic Studies:  Results for orders placed or performed during the hospital encounter of 03/17/24  Basic metabolic panel per protocol   Collection Time: 03/17/24  1:09 PM  Result Value Ref Range   Sodium 138 135 - 145 mmol/L   Potassium 3.6 3.5 - 5.1 mmol/L   Chloride 105 98 - 111 mmol/L   CO2 24 22 - 32 mmol/L   Glucose, Bld 106 (H) 70 - 99 mg/dL   BUN 12 8 - 23 mg/dL   Creatinine, Ser 9.19 0.44 - 1.00 mg/dL   Calcium  9.3 8.9 - 10.3 mg/dL   GFR, Estimated >39 >39 mL/min   Anion gap 9 5 - 15  CBC per protocol   Collection Time: 03/17/24  1:09 PM  Result Value Ref Range   WBC 8.4 4.0 - 10.5 K/uL   RBC 5.09 3.87 - 5.11 MIL/uL   Hemoglobin 15.7 (H) 12.0 - 15.0 g/dL   HCT 51.8 (H) 63.9 - 53.9 %   MCV 94.5 80.0 - 100.0 fL   MCH 30.8 26.0 - 34.0 pg   MCHC 32.6 30.0 - 36.0 g/dL   RDW 85.4 88.4 - 84.4 %   Platelets 268 150 - 400 K/uL   nRBC 0.0 0.0 - 0.2 %  Surgical pcr screen   Collection Time: 03/17/24  1:17 PM   Specimen: Nasal Mucosa; Nasal Swab  Result Value  Ref Range   MRSA, PCR NEGATIVE NEGATIVE   Staphylococcus aureus NEGATIVE NEGATIVE    DG Lumbar Spine 2-3 Views Result Date: 03/17/2024 CLINICAL DATA:  Elective surgery. EXAM: LUMBAR SPINE - 2-3 VIEW COMPARISON:  None available. FINDINGS: Two portable cross-table views of the lumbar spine submitted from the operating room. Image 1 demonstrates surgical instrument posteriorly at the L1 level. Image 2 demonstrates surgical instruments projecting posteriorly at the L1-L2 level. IMPRESSION: Intraoperative localization during lumbar surgery. Electronically Signed   By: Andrea Gasman M.D.   On: 03/17/2024 17:37    Antibiotics:  Anti-infectives (From admission, onward)    Start     Dose/Rate Route Frequency Ordered Stop   03/17/24 2200  ceFAZolin (ANCEF) IVPB 2g/100 mL premix        2 g 200 mL/hr over 30 Minutes Intravenous Every 8 hours 03/17/24 1836 03/18/24 0545   03/17/24 1245  ceFAZolin (ANCEF) IVPB 2g/100 mL premix        2 g 200 mL/hr over 30 Minutes Intravenous On call to O.R. 03/17/24 1229 03/17/24 1527       Discharge Exam: Blood pressure 135/81, pulse 82, temperature 98.3 F (36.8 C), temperature source Oral, resp. rate 18, height 5' (1.524 m), weight 49.9  kg, SpO2 100%. Neurologic: Grossly normal Dressing dry  Discharge Medications:   Allergies as of 03/18/2024       Reactions   Gentamycin [gentamicin] Anaphylaxis   Alendronate    Other Reaction(s): Pain all over body   Alendronate Sodium Other (See Comments)   alendronate sodium   Codeine Nausea Only, Other (See Comments)   codeine   Statins    Other Reaction(s): myalgias   Vancomycin Other (See Comments)   Kidney injury in 1980        Medication List     TAKE these medications    aspirin  EC 81 MG tablet Take 1 tablet (81 mg total) by mouth daily. Swallow whole.   buprenorphine 5 MCG/HR Ptwk Commonly known as: BUTRANS Place 1 patch onto the skin once a week.   cyanocobalamin 1000 MCG/ML  injection Commonly known as: VITAMIN B12 Inject 1,000 mcg into the muscle every 30 (thirty) days.   cyclobenzaprine  10 MG tablet Commonly known as: FLEXERIL  Take 1 tablet (10 mg total) by mouth 3 (three) times daily as needed for muscle spasms.   diclofenac Sodium 1 % Gel Commonly known as: VOLTAREN Apply 2 g topically as needed (Pain).   estradiol 0.1 MG/24HR patch Commonly known as: VIVELLE-DOT Place 1 patch onto the skin 2 (two) times a week.   levothyroxine  88 MCG tablet Commonly known as: SYNTHROID  Take 88 mcg by mouth daily before breakfast.   LORazepam  1 MG tablet Commonly known as: ATIVAN  TAKE 1 TABLET (1 MG TOTAL) BY MOUTH EVERY 8 (EIGHT) HOURS. What changed:  when to take this reasons to take this   Narcan  4 MG/0.1ML Liqd nasal spray kit Generic drug: naloxone  Place 0.4 mg into the nose once as needed (Pain).   ondansetron  8 MG disintegrating tablet Commonly known as: ZOFRAN -ODT Take 1 tablet by mouth 2 (two) times daily.   oxyCODONE -acetaminophen  10-325 MG tablet Commonly known as: PERCOCET Take 1 tablet by mouth 5 (five) times daily.   progesterone 200 MG capsule Commonly known as: PROMETRIUM Take 200 mg by mouth daily.   Prolia 60 MG/ML Sosy injection Generic drug: denosumab 60mg  Subcutaneous every 6 months   Repatha 140 MG/ML Sosy Generic drug: Evolocumab Inject 1 mL into the skin every 14 (fourteen) days.   rosuvastatin  20 MG tablet Commonly known as: CRESTOR  Take 1 tablet (20 mg total) by mouth daily.   Vitamin D (Ergocalciferol) 1.25 MG (50000 UNIT) Caps capsule Commonly known as: DRISDOL Take 50,000 Units by mouth every 7 (seven) days.        Disposition: home   Final Dx: L1-2 DLL and diskectomy  Discharge Instructions      Remove dressing in 72 hours   Complete by: As directed    Call MD for:  difficulty breathing, headache or visual disturbances   Complete by: As directed    Call MD for:  persistant nausea and vomiting    Complete by: As directed    Call MD for:  redness, tenderness, or signs of infection (pain, swelling, redness, odor or green/yellow discharge around incision site)   Complete by: As directed    Call MD for:  severe uncontrolled pain   Complete by: As directed    Call MD for:  temperature >100.4   Complete by: As directed    Diet - low sodium heart healthy   Complete by: As directed    Increase activity slowly   Complete by: As directed  Signed: Alm GORMAN Molt 03/18/2024, 7:19 AM

## 2024-03-18 NOTE — Evaluation (Signed)
 Occupational Therapy Evaluation Patient Details Name: Michaela Hall MRN: [id number] DOB: 15-Feb-1956 Today's Date: 03/18/2024   History of Present Illness   68 yo female s/p 10/6 laminectomy decompression microdiscectomy L1-2 PMH CAD anxiety CAD, chronic back and R hip pain, DJD, hashimotos disease OA raynaud disease back surg, hip surg, knee surg,     Clinical Impressions Patient evaluated by Occupational Therapy with no further acute OT needs identified. All education has been completed and the patient has no further questions. See below for any follow-up Occupational Therapy or equipment needs. OT to sign off. Thank you for referral.       If plan is discharge home, recommend the following:   Assist for transportation     Functional Status Assessment   Patient has had a recent decline in their functional status and demonstrates the ability to make significant improvements in function in a reasonable and predictable amount of time.     Equipment Recommendations   None recommended by OT     Recommendations for Other Services         Precautions/Restrictions   Precautions Precautions: Back Recall of Precautions/Restrictions: Intact     Mobility Bed Mobility Overal bed mobility: Modified Independent                  Transfers Overall transfer level: Modified independent Equipment used: Rolling walker (2 wheels)                      Balance Overall balance assessment: Modified Independent                                         ADL either performed or assessed with clinical judgement   ADL Overall ADL's : Modified independent                                       General ADL Comments: Back handout provided and reviewed adls in detail. Pt educated on:  set an alarm at night for medication, avoid sitting for long periods of time, correct bed positioning for sleeping, correct sequence for bed mobility,  avoiding lifting more than 5 pounds and never wash directly over incision. Pt has ice man at home and educated on making sure that ice is not on direct skin or worn constantly.  All education is complete and patient indicates understanding.      Vision Baseline Vision/History: 1 Wears glasses Patient Visual Report: No change from baseline Vision Assessment?: No apparent visual deficits     Perception         Praxis         Pertinent Vitals/Pain Pain Assessment Pain Assessment: Faces Faces Pain Scale: Hurts little more Pain Location: back Pain Descriptors / Indicators: Operative site guarding Pain Intervention(s): Monitored during session, Premedicated before session, Repositioned (reports pain in R leg area 70% better and isnt completely numb)     Extremity/Trunk Assessment Upper Extremity Assessment Upper Extremity Assessment: Overall WFL for tasks assessed   Lower Extremity Assessment Lower Extremity Assessment: Overall WFL for tasks assessed   Cervical / Trunk Assessment Cervical / Trunk Assessment: Back Surgery   Communication Communication Communication: No apparent difficulties   Cognition Arousal: Alert Behavior During Therapy: WFL for tasks assessed/performed Cognition: No apparent impairments  Following commands: Intact       Cueing  General Comments   Cueing Techniques: Verbal cues      Exercises     Shoulder Instructions      Home Living Family/patient expects to be discharged to:: Private residence Living Arrangements: Spouse/significant other Available Help at Discharge: Family;Available 24 hours/day Type of Home: House Home Access: Stairs to enter [employer] of Steps: 2 Entrance Stairs-Rails: None Home Layout: Two level;Bed/bath upstairs Alternate Level Stairs-Number of Steps: 16 Alternate Level Stairs-Rails: Right;Left;Can reach both Bathroom Shower/Tub: Scientist, research (life sciences): Standard     Home Equipment: Cane - single Librarian, academic (2 wheels);Grab bars - toilet;Grab bars - tub/shower;Hand held shower head   Additional Comments: spouse with [place] and so niece will help for 3-4 days post op      Prior Functioning/Environment Prior Level of Function : Independent/Modified Independent;Driving             Mobility Comments: Ambulates without AD. She reports intermittent furniture walking and heavy reliance on UEs to get up the stairs. She has fainted ~6 times in the past 12mo; currently being worked up by a cardiologist. ADLs Comments: reports staying on the second floor and dependent upon spouse to bring items to the bedroom due to extreme pain    OT Problem List:     OT Treatment/Interventions:        OT Goals(Current goals can be found in the care plan section)   Acute Rehab OT Goals Patient Stated Goal: to go home so i can sleep   OT Frequency:       Co-evaluation              AM-PAC OT 6 Clicks Daily Activity     Outcome Measure Help from another person eating meals?: None Help from another person taking care of personal grooming?: None Help from another person toileting, which includes using toliet, bedpan, or urinal?: None Help from another person bathing (including washing, rinsing, drying)?: None Help from another person to put on and taking off regular upper body clothing?: None Help from another person to put on and taking off regular lower body clothing?: None 6 Click Score: 24   End of Session Nurse Communication: Mobility status;Precautions  Activity Tolerance: Patient tolerated treatment well Patient left: in bed;with call bell/phone within reach  OT Visit Diagnosis: Unsteadiness on feet (R26.81)                Time: 9169-9146 OT Time Calculation (min): 23 min Charges:  OT General Charges $OT Visit: 1 Visit OT Evaluation $OT Eval Low Complexity: 1 Low   Brynn, OTR/L  Acute Rehabilitation  Services Office: [phone] .   Ely Molt 03/18/2024, 9:08 AM

## 2024-04-10 ENCOUNTER — Ambulatory Visit: Admitting: Nurse Practitioner

## 2024-04-16 ENCOUNTER — Other Ambulatory Visit: Payer: Self-pay | Admitting: *Deleted

## 2024-04-16 NOTE — Telephone Encounter (Signed)
 [facility] requesting Rx Lorazepam  refill  Last OV 01/28/2024 Last refill on 01/28/2024

## 2024-04-17 MED ORDER — LORAZEPAM 1 MG PO TABS: 1.0000 mg | ORAL_TABLET | Freq: Three times a day (TID) | ORAL | 3 refills | Status: AC

## 2024-06-10 ENCOUNTER — Other Ambulatory Visit (HOSPITAL_COMMUNITY): Payer: Self-pay | Admitting: Neurological Surgery

## 2024-06-10 ENCOUNTER — Other Ambulatory Visit (HOSPITAL_COMMUNITY)

## 2024-06-10 ENCOUNTER — Ambulatory Visit (HOSPITAL_COMMUNITY)
Admission: RE | Admit: 2024-06-10 | Discharge: 2024-06-10 | Disposition: A | Discharge status: Home / Self Care | Source: Ambulatory Visit | Attending: Neurological Surgery | Admitting: Neurological Surgery

## 2024-06-10 DIAGNOSIS — R338 Other retention of urine: Secondary | ICD-10-CM | POA: Insufficient documentation

## 2024-06-10 DIAGNOSIS — N401 Enlarged prostate with lower urinary tract symptoms: Secondary | ICD-10-CM | POA: Insufficient documentation

## 2024-06-10 MED ORDER — GADOBUTROL 1 MMOL/ML IV SOLN
5.0000 mL | Freq: Once | INTRAVENOUS | Status: AC | PRN
  Administered 2024-06-10: 5 mL via INTRAVENOUS

## 2024-06-24 ENCOUNTER — Ambulatory Visit: Payer: Medicare Other
